# Patient Record
Sex: Female | Born: 1960 | Race: Black or African American | Hispanic: No | Marital: Married | Smoking: Former smoker
Health system: Southern US, Community
[De-identification: ages and names within clinical notes are randomized; demographics above are authoritative.]

## PROBLEM LIST (undated history)

## (undated) DIAGNOSIS — G40909 Epilepsy, unspecified, not intractable, without status epilepticus: Secondary | ICD-10-CM

## (undated) DIAGNOSIS — I10 Essential (primary) hypertension: Secondary | ICD-10-CM

## (undated) DIAGNOSIS — I509 Heart failure, unspecified: Secondary | ICD-10-CM

## (undated) DIAGNOSIS — F419 Anxiety disorder, unspecified: Secondary | ICD-10-CM

## (undated) DIAGNOSIS — J45909 Unspecified asthma, uncomplicated: Secondary | ICD-10-CM

## (undated) DIAGNOSIS — D869 Sarcoidosis, unspecified: Secondary | ICD-10-CM

## (undated) DIAGNOSIS — F319 Bipolar disorder, unspecified: Secondary | ICD-10-CM

## (undated) DIAGNOSIS — E538 Deficiency of other specified B group vitamins: Secondary | ICD-10-CM

## (undated) HISTORY — DX: Deficiency of other specified B group vitamins: E53.8

## (undated) HISTORY — DX: Anxiety disorder, unspecified: F41.9

## (undated) HISTORY — PX: TUBAL LIGATION: SHX77

## (undated) HISTORY — PX: TONSILLECTOMY: SUR1361

---

## 1999-12-11 HISTORY — PX: COLONOSCOPY: SHX174

## 2000-02-16 ENCOUNTER — Inpatient Hospital Stay (HOSPITAL_COMMUNITY): Admission: EM | Admit: 2000-02-16 | Discharge: 2000-02-23 | Payer: Self-pay | Admitting: Psychiatry

## 2000-03-06 ENCOUNTER — Encounter: Payer: Self-pay | Admitting: Internal Medicine

## 2000-03-06 ENCOUNTER — Encounter: Admission: RE | Admit: 2000-03-06 | Discharge: 2000-03-06 | Payer: Self-pay | Admitting: Internal Medicine

## 2000-04-05 ENCOUNTER — Other Ambulatory Visit: Admission: RE | Admit: 2000-04-05 | Discharge: 2000-04-05 | Payer: Self-pay | Admitting: Internal Medicine

## 2000-04-29 ENCOUNTER — Emergency Department (HOSPITAL_COMMUNITY): Admission: EM | Admit: 2000-04-29 | Discharge: 2000-04-29 | Payer: Self-pay | Admitting: Emergency Medicine

## 2000-05-25 ENCOUNTER — Emergency Department (HOSPITAL_COMMUNITY): Admission: EM | Admit: 2000-05-25 | Discharge: 2000-05-25 | Payer: Self-pay | Admitting: Emergency Medicine

## 2000-05-25 ENCOUNTER — Encounter: Payer: Self-pay | Admitting: Emergency Medicine

## 2000-06-22 ENCOUNTER — Emergency Department (HOSPITAL_COMMUNITY): Admission: EM | Admit: 2000-06-22 | Discharge: 2000-06-22 | Payer: Self-pay | Admitting: Emergency Medicine

## 2000-06-23 ENCOUNTER — Emergency Department (HOSPITAL_COMMUNITY): Admission: EM | Admit: 2000-06-23 | Discharge: 2000-06-23 | Payer: Self-pay | Admitting: Emergency Medicine

## 2000-07-29 ENCOUNTER — Ambulatory Visit (HOSPITAL_COMMUNITY): Admission: RE | Admit: 2000-07-29 | Discharge: 2000-07-29 | Payer: Self-pay | Admitting: Gastroenterology

## 2000-07-29 ENCOUNTER — Encounter: Payer: Self-pay | Admitting: Gastroenterology

## 2000-09-03 ENCOUNTER — Ambulatory Visit (HOSPITAL_COMMUNITY): Admission: RE | Admit: 2000-09-03 | Discharge: 2000-09-03 | Payer: Self-pay | Admitting: Gastroenterology

## 2000-09-03 ENCOUNTER — Encounter: Payer: Self-pay | Admitting: Gastroenterology

## 2001-03-28 ENCOUNTER — Encounter: Payer: Self-pay | Admitting: Emergency Medicine

## 2001-03-28 ENCOUNTER — Emergency Department (HOSPITAL_COMMUNITY): Admission: EM | Admit: 2001-03-28 | Discharge: 2001-03-28 | Payer: Self-pay | Admitting: Emergency Medicine

## 2001-04-29 ENCOUNTER — Inpatient Hospital Stay (HOSPITAL_COMMUNITY): Admission: EM | Admit: 2001-04-29 | Discharge: 2001-05-02 | Payer: Self-pay | Admitting: Psychiatry

## 2001-10-23 ENCOUNTER — Emergency Department (HOSPITAL_COMMUNITY): Admission: EM | Admit: 2001-10-23 | Discharge: 2001-10-23 | Payer: Self-pay | Admitting: Emergency Medicine

## 2002-01-11 ENCOUNTER — Emergency Department (HOSPITAL_COMMUNITY): Admission: EM | Admit: 2002-01-11 | Discharge: 2002-01-11 | Payer: Self-pay | Admitting: Emergency Medicine

## 2002-02-09 ENCOUNTER — Encounter
Admission: RE | Admit: 2002-02-09 | Discharge: 2002-03-12 | Payer: Self-pay | Admitting: Physical Medicine and Rehabilitation

## 2002-02-20 ENCOUNTER — Emergency Department (HOSPITAL_COMMUNITY): Admission: EM | Admit: 2002-02-20 | Discharge: 2002-02-21 | Payer: Self-pay | Admitting: Emergency Medicine

## 2002-02-26 ENCOUNTER — Encounter: Admission: RE | Admit: 2002-02-26 | Discharge: 2002-02-26 | Payer: Self-pay | Admitting: Urology

## 2002-02-26 ENCOUNTER — Encounter: Payer: Self-pay | Admitting: Urology

## 2002-08-24 ENCOUNTER — Encounter: Payer: Self-pay | Admitting: Emergency Medicine

## 2002-08-24 ENCOUNTER — Emergency Department (HOSPITAL_COMMUNITY): Admission: EM | Admit: 2002-08-24 | Discharge: 2002-08-24 | Payer: Self-pay | Admitting: Emergency Medicine

## 2002-10-18 ENCOUNTER — Emergency Department (HOSPITAL_COMMUNITY): Admission: EM | Admit: 2002-10-18 | Discharge: 2002-10-18 | Payer: Self-pay | Admitting: Emergency Medicine

## 2002-12-15 ENCOUNTER — Emergency Department (HOSPITAL_COMMUNITY): Admission: EM | Admit: 2002-12-15 | Discharge: 2002-12-15 | Payer: Self-pay | Admitting: Emergency Medicine

## 2003-02-20 ENCOUNTER — Encounter: Payer: Self-pay | Admitting: Emergency Medicine

## 2003-02-20 ENCOUNTER — Inpatient Hospital Stay (HOSPITAL_COMMUNITY): Admission: EM | Admit: 2003-02-20 | Discharge: 2003-02-22 | Payer: Self-pay | Admitting: Emergency Medicine

## 2003-02-22 ENCOUNTER — Encounter (INDEPENDENT_AMBULATORY_CARE_PROVIDER_SITE_OTHER): Payer: Self-pay | Admitting: Cardiovascular Disease

## 2003-06-08 ENCOUNTER — Emergency Department (HOSPITAL_COMMUNITY): Admission: EM | Admit: 2003-06-08 | Discharge: 2003-06-08 | Payer: Self-pay | Admitting: Emergency Medicine

## 2003-08-14 ENCOUNTER — Encounter: Payer: Self-pay | Admitting: Emergency Medicine

## 2003-08-14 ENCOUNTER — Emergency Department (HOSPITAL_COMMUNITY): Admission: EM | Admit: 2003-08-14 | Discharge: 2003-08-15 | Payer: Self-pay

## 2003-12-09 ENCOUNTER — Emergency Department (HOSPITAL_COMMUNITY): Admission: EM | Admit: 2003-12-09 | Discharge: 2003-12-10 | Payer: Self-pay | Admitting: Emergency Medicine

## 2004-05-22 ENCOUNTER — Ambulatory Visit (HOSPITAL_COMMUNITY): Admission: RE | Admit: 2004-05-22 | Discharge: 2004-05-22 | Payer: Self-pay | Admitting: Obstetrics and Gynecology

## 2005-01-06 ENCOUNTER — Inpatient Hospital Stay (HOSPITAL_COMMUNITY): Admission: AD | Admit: 2005-01-06 | Discharge: 2005-01-06 | Payer: Self-pay | Admitting: Obstetrics and Gynecology

## 2005-01-31 ENCOUNTER — Emergency Department (HOSPITAL_COMMUNITY): Admission: EM | Admit: 2005-01-31 | Discharge: 2005-01-31 | Payer: Self-pay | Admitting: Emergency Medicine

## 2005-06-01 ENCOUNTER — Emergency Department (HOSPITAL_COMMUNITY): Admission: EM | Admit: 2005-06-01 | Discharge: 2005-06-01 | Payer: Self-pay | Admitting: Family Medicine

## 2005-09-18 ENCOUNTER — Encounter: Admission: RE | Admit: 2005-09-18 | Discharge: 2005-09-18 | Payer: Self-pay | Admitting: Internal Medicine

## 2005-10-17 ENCOUNTER — Emergency Department (HOSPITAL_COMMUNITY): Admission: EM | Admit: 2005-10-17 | Discharge: 2005-10-18 | Payer: Self-pay | Admitting: Emergency Medicine

## 2006-02-24 ENCOUNTER — Emergency Department (HOSPITAL_COMMUNITY): Admission: EM | Admit: 2006-02-24 | Discharge: 2006-02-25 | Payer: Self-pay | Admitting: Emergency Medicine

## 2006-04-02 ENCOUNTER — Emergency Department (HOSPITAL_COMMUNITY): Admission: EM | Admit: 2006-04-02 | Discharge: 2006-04-02 | Payer: Self-pay | Admitting: Emergency Medicine

## 2006-09-01 ENCOUNTER — Emergency Department (HOSPITAL_COMMUNITY): Admission: EM | Admit: 2006-09-01 | Discharge: 2006-09-01 | Payer: Self-pay | Admitting: Family Medicine

## 2007-01-17 ENCOUNTER — Emergency Department (HOSPITAL_COMMUNITY): Admission: EM | Admit: 2007-01-17 | Discharge: 2007-01-17 | Payer: Self-pay | Admitting: Family Medicine

## 2007-03-02 ENCOUNTER — Emergency Department (HOSPITAL_COMMUNITY): Admission: EM | Admit: 2007-03-02 | Discharge: 2007-03-02 | Payer: Self-pay | Admitting: Emergency Medicine

## 2007-06-19 ENCOUNTER — Emergency Department (HOSPITAL_COMMUNITY): Admission: EM | Admit: 2007-06-19 | Discharge: 2007-06-19 | Payer: Self-pay | Admitting: Emergency Medicine

## 2007-07-04 ENCOUNTER — Emergency Department (HOSPITAL_COMMUNITY): Admission: EM | Admit: 2007-07-04 | Discharge: 2007-07-04 | Payer: Self-pay | Admitting: Family Medicine

## 2007-07-21 ENCOUNTER — Emergency Department (HOSPITAL_COMMUNITY): Admission: EM | Admit: 2007-07-21 | Discharge: 2007-07-21 | Payer: Self-pay | Admitting: Family Medicine

## 2007-08-24 ENCOUNTER — Emergency Department (HOSPITAL_COMMUNITY): Admission: EM | Admit: 2007-08-24 | Discharge: 2007-08-25 | Payer: Self-pay | Admitting: Emergency Medicine

## 2008-04-30 ENCOUNTER — Emergency Department (HOSPITAL_COMMUNITY): Admission: EM | Admit: 2008-04-30 | Discharge: 2008-04-30 | Payer: Self-pay | Admitting: Emergency Medicine

## 2008-05-16 ENCOUNTER — Emergency Department (HOSPITAL_COMMUNITY): Admission: EM | Admit: 2008-05-16 | Discharge: 2008-05-16 | Payer: Self-pay | Admitting: Family Medicine

## 2008-10-15 ENCOUNTER — Emergency Department (HOSPITAL_COMMUNITY): Admission: EM | Admit: 2008-10-15 | Discharge: 2008-10-15 | Payer: Self-pay | Admitting: Family Medicine

## 2009-04-02 ENCOUNTER — Emergency Department (HOSPITAL_COMMUNITY): Admission: EM | Admit: 2009-04-02 | Discharge: 2009-04-02 | Payer: Self-pay | Admitting: Family Medicine

## 2009-04-11 ENCOUNTER — Emergency Department (HOSPITAL_COMMUNITY): Admission: EM | Admit: 2009-04-11 | Discharge: 2009-04-11 | Payer: Self-pay | Admitting: Emergency Medicine

## 2010-07-13 ENCOUNTER — Emergency Department (HOSPITAL_COMMUNITY): Admission: EM | Admit: 2010-07-13 | Discharge: 2010-07-13 | Payer: Self-pay | Admitting: Family Medicine

## 2010-08-16 ENCOUNTER — Emergency Department (HOSPITAL_COMMUNITY): Admission: EM | Admit: 2010-08-16 | Discharge: 2010-08-16 | Payer: Self-pay | Admitting: Family Medicine

## 2010-11-12 ENCOUNTER — Emergency Department (HOSPITAL_COMMUNITY)
Admission: EM | Admit: 2010-11-12 | Discharge: 2010-11-12 | Payer: Self-pay | Source: Home / Self Care | Admitting: Emergency Medicine

## 2010-11-27 ENCOUNTER — Emergency Department (HOSPITAL_COMMUNITY)
Admission: EM | Admit: 2010-11-27 | Discharge: 2010-11-27 | Payer: Self-pay | Source: Home / Self Care | Admitting: Emergency Medicine

## 2010-12-30 ENCOUNTER — Encounter: Payer: Self-pay | Admitting: Internal Medicine

## 2010-12-31 ENCOUNTER — Encounter: Payer: Self-pay | Admitting: Internal Medicine

## 2011-01-05 ENCOUNTER — Emergency Department (HOSPITAL_COMMUNITY)
Admission: EM | Admit: 2011-01-05 | Discharge: 2011-01-05 | Payer: Self-pay | Source: Home / Self Care | Admitting: Emergency Medicine

## 2011-01-05 LAB — CBC
HCT: 40.5 % (ref 36.0–46.0)
MCH: 29.4 pg (ref 26.0–34.0)
MCHC: 32.8 g/dL (ref 30.0–36.0)
MCV: 89.4 fL (ref 78.0–100.0)
Platelets: 222 10*3/uL (ref 150–400)
RBC: 4.53 MIL/uL (ref 3.87–5.11)
RDW: 13.6 % (ref 11.5–15.5)
WBC: 6.9 10*3/uL (ref 4.0–10.5)

## 2011-01-05 LAB — BASIC METABOLIC PANEL
BUN: 9 mg/dL (ref 6–23)
Calcium: 9 mg/dL (ref 8.4–10.5)
Creatinine, Ser: 1 mg/dL (ref 0.4–1.2)
GFR calc Af Amer: >60 mL/min (ref >60)
GFR calc non Af Amer: 59 mL/min — ABNORMAL LOW (ref >60)
Glucose, Bld: 101 mg/dL — ABNORMAL HIGH (ref 70–99)
Sodium: 142 mEq/L (ref 135–145)

## 2011-02-19 LAB — URINALYSIS, ROUTINE W REFLEX MICROSCOPIC
Bilirubin Urine: NEGATIVE
Bilirubin Urine: NEGATIVE
Glucose, UA: NEGATIVE mg/dL
Glucose, UA: NEGATIVE mg/dL
Hgb urine dipstick: NEGATIVE
Hgb urine dipstick: NEGATIVE
Ketones, ur: NEGATIVE mg/dL
Ketones, ur: NEGATIVE mg/dL
Leukocytes, UA: NEGATIVE
Nitrite: NEGATIVE
Nitrite: NEGATIVE
Protein, ur: NEGATIVE mg/dL
Protein, ur: NEGATIVE mg/dL
Specific Gravity, Urine: 1.022 (ref 1.005–1.030)
Specific Gravity, Urine: 1.024 (ref 1.005–1.030)
Urobilinogen, UA: 0.2 mg/dL (ref 0.0–1.0)
Urobilinogen, UA: 0.2 mg/dL (ref 0.0–1.0)
pH: 5.5 (ref 5.0–8.0)
pH: 7.5 (ref 5.0–8.0)

## 2011-02-19 LAB — CBC
HCT: 39.8 % (ref 36.0–46.0)
Hemoglobin: 13.2 g/dL (ref 12.0–15.0)
MCH: 29.9 pg (ref 26.0–34.0)
MCH: 29.9 pg (ref 26.0–34.0)
MCHC: 33.2 g/dL (ref 30.0–36.0)
MCHC: 32.9 g/dL (ref 30.0–36.0)
MCV: 90 fL (ref 78.0–100.0)
Platelets: 231 10*3/uL (ref 150–400)
Platelets: 227 10*3/uL (ref 150–400)
RBC: 4.42 MIL/uL (ref 3.87–5.11)
RDW: 13.4 % (ref 11.5–15.5)
WBC: 9.2 10*3/uL (ref 4.0–10.5)

## 2011-02-19 LAB — DIFFERENTIAL
Basophils Absolute: 0 10*3/uL (ref 0.0–0.1)
Basophils Absolute: 0 10*3/uL (ref 0.0–0.1)
Basophils Relative: 0 % (ref 0–1)
Basophils Relative: 0 % (ref 0–1)
Eosinophils Absolute: 0 10*3/uL (ref 0.0–0.7)
Eosinophils Absolute: 0.1 10*3/uL (ref 0.0–0.7)
Eosinophils Relative: 0 % (ref 0–5)
Lymphocytes Relative: 23 % (ref 12–46)
Lymphs Abs: 2.1 10*3/uL (ref 0.7–4.0)
Monocytes Absolute: 0.5 10*3/uL (ref 0.1–1.0)
Monocytes Relative: 5 % (ref 3–12)
Neutro Abs: 6.5 10*3/uL (ref 1.7–7.7)
Neutrophils Relative %: 71 % (ref 43–77)
Neutrophils Relative %: 60 % (ref 43–77)

## 2011-02-19 LAB — BASIC METABOLIC PANEL
BUN: 6 mg/dL (ref 6–23)
CO2: 20 mEq/L (ref 19–32)
Calcium: 8.9 mg/dL (ref 8.4–10.5)
Chloride: 113 mEq/L — ABNORMAL HIGH (ref 96–112)
Creatinine, Ser: 0.96 mg/dL (ref 0.4–1.2)
GFR calc Af Amer: >60 mL/min (ref >60)
GFR calc non Af Amer: >60 mL/min (ref >60)
Glucose, Bld: 102 mg/dL — ABNORMAL HIGH (ref 70–99)
Potassium: 3.5 mEq/L (ref 3.5–5.1)
Sodium: 139 mEq/L (ref 135–145)

## 2011-02-19 LAB — URINE MICROSCOPIC-ADD ON

## 2011-02-19 LAB — HEPATIC FUNCTION PANEL
ALT: 12 U/L (ref 0–35)
AST: 13 U/L (ref 0–37)
Albumin: 3.2 g/dL — ABNORMAL LOW (ref 3.5–5.2)
Alkaline Phosphatase: 65 U/L (ref 39–117)
Bilirubin, Direct: <0.1 mg/dL (ref 0.0–0.3)
Total Bilirubin: 0.3 mg/dL (ref 0.3–1.2)
Total Protein: 6.9 g/dL (ref 6.0–8.3)

## 2011-02-19 LAB — POCT I-STAT, CHEM 8
Glucose, Bld: 89 mg/dL (ref 70–99)
HCT: 42 % (ref 36.0–46.0)
Hemoglobin: 14.3 g/dL (ref 12.0–15.0)
Potassium: 3.9 mEq/L (ref 3.5–5.1)
Sodium: 143 mEq/L (ref 135–145)

## 2011-02-19 LAB — URINE CULTURE
Colony Count: NO GROWTH
Culture  Setup Time: 201112041154
Culture: NO GROWTH

## 2011-02-19 LAB — POCT PREGNANCY, URINE
Preg Test, Ur: NEGATIVE
Preg Test, Ur: NEGATIVE

## 2011-02-22 LAB — POCT URINALYSIS DIPSTICK
Bilirubin Urine: NEGATIVE
Ketones, ur: NEGATIVE mg/dL
Nitrite: NEGATIVE
Protein, ur: NEGATIVE mg/dL

## 2011-02-23 LAB — POCT URINALYSIS DIPSTICK
Bilirubin Urine: NEGATIVE
Glucose, UA: NEGATIVE mg/dL
Hgb urine dipstick: NEGATIVE
Nitrite: NEGATIVE
Specific Gravity, Urine: 1.025 (ref 1.005–1.030)
Urobilinogen, UA: 0.2 mg/dL (ref 0.0–1.0)
pH: 5 (ref 5.0–8.0)

## 2011-03-20 LAB — POCT URINALYSIS DIP (DEVICE)
Bilirubin Urine: NEGATIVE
Glucose, UA: NEGATIVE mg/dL
Hgb urine dipstick: NEGATIVE
Specific Gravity, Urine: 1.025 (ref 1.005–1.030)
pH: 6.5 (ref 5.0–8.0)

## 2011-03-21 LAB — POCT URINALYSIS DIP (DEVICE)
Hgb urine dipstick: NEGATIVE
Ketones, ur: 40 mg/dL — AB
Protein, ur: 30 mg/dL — AB
pH: 6 (ref 5.0–8.0)

## 2011-04-27 NOTE — H&P (Signed)
[facility]  Patient:    Mikayla Sweeney, Mikayla Sweeney                      MRN: [id number] Adm. Date:  [id number] Attending:  Samule Dry                         History and Physical  DATE OF ADMISSION:  Apr 29, 2001  PATIENT IDENTIFICATION:  [name] is a 50 year old married African-American female admitted for depression, alcohol intoxication, and suicidal ideation with a plan to overdose on medication.  HISTORY OF PRESENT ILLNESS:  During the past month she has experienced some increased marital arguments.  There has been two weeks of euphoria with increased energy and decreased need for sleep followed by two recent weeks of suicidal ideation, depressed mood, increased sleep to 13 hours per day, depressed energy, and suicidal ideation with a plan to overdose on medication.  After being abstinent for eight years, the patient relapsed on alcohol the night prior to admission with six and a half beers.  PAST PSYCHIATRIC HISTORY:  The patient does have a long-term history of recurrent bouts of euphoria, decreased sleep, racing thoughts, pressured speech.  She has also undergone several severe depressive periods without any suicide attempts.  She has undergone five psychiatric admissions.  The last psychiatric admission was in March 2001 at [facility].  She does have a history of panic attacks which involve shortness of breath, feeling on edge, sweats, thoughts of doom and dread.  These have responded to SSRI therapy.  She has been tried on various psychotropics in the past with her seizure disorder as well as bipolar disorder stabilized on Topamax 200 mg p.o. b.i.d. along with Klonopin and Risperdal for antipychosis/antimania.  Of note, Risperdal has been particularly effective in eliminating suicidal thoughts.  When she has come off if it in the past, these suicidal thoughts have always returned.  However, the Risperdal  was not able to prevent the resumption of suicidal thoughts for this episode.  SUBSTANCE ABUSE HISTORY:  The patient has a history of drinking 12 to 24 beers daily for several months up to eight years ago.  She underwent residential alcohol rehabilitation at the [facility] in [place], [place].  The patient does have a history of periodic experimentation with crystal powder cocaine and marijuana when she was drinking, none in over eight years.  PAST MEDICAL HISTORY:  Gastroesophageal reflux disease, seizure disorder, hypertension, sarcoidosis.  Hemoglobin 15, hematocrit 43.  Basic chemistry panel: Within normal limits.  Tylenol and ASA levels: Unremarkable.  BAL was 106 several hours after consuming the alcohol.  Urine drug screen was unremarkable.  The patient has no known drug allergies.  Medications on admission: Klonopin 1 mg q.h.s., Celexa 80 mg p.o. q.d., Risperdal 3 mg p.o. q.h.s., Topamax 200 mg p.o. b.i.d., minocycline 100 mg p.o. q.d., Diltiazem CD 120 mg p.o. q.d., Prilosec 20 mg p.o. q.d., prednisone 5 mg p.o. q.d., metoclopramide 10 mg p.o. a.c. and h.s.  Review of systems: Cardiovascular: No chest pain or palpitations.  Concerning surgical history, she did have a T&A and tubal ligation in the past. Respiratory: No coughing or wheezing.  Although she does have sarcoidosis, there have been no manifestations recently.  Neurologic: No recent seizures. Endocrine/metabolic: No heat or cold intolerance.  Gastrointestinal: Some nausea the day prior to admission but none on May 21, no vomiting or diarrhea.  Genitourinary: No dysuria.  Musculoskeletal: Does continue with regular diffuse joint pain and does take prednisone regularly.  Skin: History of acne. Ob/Gyn: Has had regular Pap smears and has her next one due in three months. Head: No recent trauma, no visual or auditory difficulty.  Primary care physician is Dr. Willey Blade.  Physical examination: Neurologic: No  abnormal involuntary movements. Temperature 98.1, pulse 82, respirations 18, blood pressure 110/65 sitting, 126/78 standing.  Please see the emergency room report for the rest of the physical examination.  SOCIAL HISTORY:  The patient was born in [place], [place].  She had five brothers and two sisters.  No history of abuse.  She was educated through two years of college.  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  She has three children.  This is her second marriage.  She currently lives with her husband.  Family psychiatric history: Brother with bipolar disorder.  MENTAL STATUS EXAMINATION:  Alert, oriented to all spheres.  Speech: Within normal limits.  Thought process: Logical, coherent, and goal-directed, no looseness of associations.  Thought content: Does have suicidal ideation, does contract for no harm, no thoughts of harming others, no delusions, no hallucinations.  Memory: Within normal limits.  Concentration: Decreased. Affect is constricted.  Judgment is good for the need of treatment.  ADMISSION DIAGNOSES: Axis I:    1. Bipolar I disorder, depressed.            2. Panic disorder in current remission.            3. Alcohol dependence without physiologic dependence. Axis II:   Deferred. Axis III:  1. Recovering from alcohol intoxication.            2. Gastroesophageal reflux disease.            3. Hypertension.            4. Seizure disorder.            5. Sarcoidosis. Axis IV:   Marital problem. Axis V:    45.  INITIAL PLAN OF CARE: 1. Will consider Lamictal for mood stabilization versus increased Topamax. 2. The patient will recover from her alcohol intoxication and will continue to    be observed.  She will enter milieu and group psychotherapy in the dual    diagnosis tract. 3. No change in her other psychotropics planned since the patient has recently    experienced both hypomanic and depressive symptoms, her antidepressant     regimen will not be adjusted yet.  ESTIMATED LENGTH OF STAY:  Five days.  CONDITIONS NECESSARY FOR DISCHARGE:  Normal sleep, improved mood, absence of  destructive thought content.  POST HOSPITAL CARE PLAN:  Regular outpatient versus IOP. DD:  04/30/01 TD:  04/30/01 Job: [id number]

## 2011-04-27 NOTE — Discharge Summary (Signed)
[facility]  Patient:    Mikayla Sweeney, Mikayla Sweeney                      MRN: [id number] Adm. Date:  [id number] Disc. Date: [id number] Attending:  Devoria Albe                           Discharge Summary  HISTORY OF PRESENT ILLNESS:  Please see the admission history and physical dictation.  The patients blood pressure remained stable throughout the hospital course.  Thyroid panel was unremarkable.  CBC was unremarkable.  CMP was unremarkable. Urine drug screen was negative.  Urinalysis was unremarkable.  HOSPITAL COURSE:  Mikayla Sweeney was admitted to the inpatient psychiatric unit of [facility] and underwent milieu, group, and one-to-one psychotherapy.  She responded well to the safe holding environment and the therapy.  She tolerated her increase of Topamax to 200 mg q.a.m. and 250 mg q.h.s. well without adverse effects.  Her suicidal ideation resolved.  She showed no signs of alcohol withdrawal.  She maintained appropriate social behavior and her sleep resolved to within normal limits.  CONDITION ON DISCHARGE:  By May 24, Mikayla Sweeney continued to be socially appropriate with staff and other patients.  Mood, interest, energy, concentration, and appetite were all within normal limits.  She was not having any racing thoughts, hallucinations, or delusions.  She was not having any abnormal involuntary movements or other adverse medication effects.  Her sleep was within normal limits.  MENTAL STATUS EXAMINATION UPON DISCHARGE:  Alert, oriented to all spheres. Speech is within normal limits.  Thought process is logical, coherent, goal-directed, no looseness of associations.  Thought content: No thoughts of harming herself, no thoughts of harming others, no delusions, no hallucinations.  Memory: Within normal limits.  Concentration: Within normal limits.  Affect is broad and appropriate.  Judgment: Within normal limits.  DISCHARGE DIAGNOSES: Axis I:     Bipolar I disorder, depressed, in clinical remission. Axis II:   Deferred. Axis III:  1. Seizure disorder.            2. Gastroesophageal reflux disease.            3. Hypertension.            4. Sarcoidosis. Axis IV:   Marital. Axis V:    60.  DISCHARGE PLAN:  The patient agrees to call for thoughts of harming herself, thoughts of harming others, distress, hallucinations.  DISCHARGE MEDICATIONS: 1. Celexa 80 mg p.o. q.d. 2. Klonopin 1 mg p.o. q.h.s. 3. Diltiazem 120 mg p.o. q.d. 4. Minocin 100 mg p.o. q.d. 5. Protonix 40 mg p.o. q.d. 6. Prednisone 5 mg p.o. q.d. 7. Risperdal 3 mg p.o. q.h.s. 8. Topamax 200 mg p.o. q.a.m. and 250 mg p.o. q.h.s.  The patient will    increase the Topamax to 200 mg p.o. q.a.m. and 300 mg p.o. q.h.s. on May 27    for a more solid mood stabilizing dose.  ACTIVITY:  No driving.  DIET:  No restrictions.  FOLLOW-UP APPOINTMENTS: 1. Dr. Jennelle Human for psychiatric care at 1315 on May 31. 2. [facility] for her seizure management. DD:  05/02/01 TD:  05/02/01 Job: [id number]

## 2011-05-04 ENCOUNTER — Emergency Department (HOSPITAL_COMMUNITY)
Admission: EM | Admit: 2011-05-04 | Discharge: 2011-05-04 | Disposition: A | Discharge status: Home / Self Care | Payer: Medicare PPO | Attending: Emergency Medicine | Admitting: Emergency Medicine

## 2011-05-04 DIAGNOSIS — F411 Generalized anxiety disorder: Secondary | ICD-10-CM | POA: Insufficient documentation

## 2011-05-04 DIAGNOSIS — Z79899 Other long term (current) drug therapy: Secondary | ICD-10-CM | POA: Insufficient documentation

## 2011-05-04 DIAGNOSIS — F319 Bipolar disorder, unspecified: Secondary | ICD-10-CM | POA: Insufficient documentation

## 2011-05-04 DIAGNOSIS — G40909 Epilepsy, unspecified, not intractable, without status epilepticus: Secondary | ICD-10-CM | POA: Insufficient documentation

## 2011-09-06 LAB — INFLUENZA A AND B ANTIGEN (CONVERTED LAB): Influenza B Ag: NEGATIVE

## 2011-09-11 LAB — POCT URINALYSIS DIP (DEVICE)
Glucose, UA: NEGATIVE mg/dL
Ketones, ur: NEGATIVE mg/dL
Operator id: 239701
Specific Gravity, Urine: >=1.03 (ref 1.005–1.030)

## 2011-09-20 LAB — URINALYSIS, ROUTINE W REFLEX MICROSCOPIC
Bilirubin Urine: NEGATIVE
Nitrite: NEGATIVE
Specific Gravity, Urine: 1.011
Urobilinogen, UA: 0.2

## 2011-09-20 LAB — PREGNANCY, URINE: Preg Test, Ur: NEGATIVE

## 2011-09-24 LAB — POCT URINALYSIS DIP (DEVICE)
Glucose, UA: NEGATIVE
Hgb urine dipstick: NEGATIVE
Nitrite: NEGATIVE
Urobilinogen, UA: 0.2

## 2011-09-25 LAB — POCT URINALYSIS DIP (DEVICE)
Bilirubin Urine: NEGATIVE
Hgb urine dipstick: NEGATIVE
Ketones, ur: NEGATIVE
pH: 5.5

## 2011-09-25 LAB — I-STAT 8, (EC8 V) (CONVERTED LAB)
Bicarbonate: 19.8 — ABNORMAL LOW
Glucose, Bld: 91
Hemoglobin: 16.3 — ABNORMAL HIGH
Sodium: 141
TCO2: 21
pH, Ven: 7.326 — ABNORMAL HIGH

## 2011-12-20 ENCOUNTER — Other Ambulatory Visit: Payer: Self-pay | Admitting: Family Medicine

## 2011-12-20 ENCOUNTER — Other Ambulatory Visit (HOSPITAL_COMMUNITY)
Admission: RE | Admit: 2011-12-20 | Discharge: 2011-12-20 | Disposition: A | Discharge status: Home / Self Care | Payer: Medicare PPO | Source: Ambulatory Visit | Attending: Family Medicine | Admitting: Family Medicine

## 2011-12-20 DIAGNOSIS — Z1159 Encounter for screening for other viral diseases: Secondary | ICD-10-CM | POA: Insufficient documentation

## 2011-12-20 DIAGNOSIS — Z124 Encounter for screening for malignant neoplasm of cervix: Secondary | ICD-10-CM | POA: Insufficient documentation

## 2012-09-24 ENCOUNTER — Encounter (HOSPITAL_COMMUNITY): Payer: Self-pay | Admitting: Emergency Medicine

## 2012-09-24 DIAGNOSIS — F319 Bipolar disorder, unspecified: Secondary | ICD-10-CM | POA: Insufficient documentation

## 2012-09-24 DIAGNOSIS — R1110 Vomiting, unspecified: Secondary | ICD-10-CM | POA: Insufficient documentation

## 2012-09-24 DIAGNOSIS — R109 Unspecified abdominal pain: Secondary | ICD-10-CM | POA: Insufficient documentation

## 2012-09-24 DIAGNOSIS — G40909 Epilepsy, unspecified, not intractable, without status epilepticus: Secondary | ICD-10-CM | POA: Insufficient documentation

## 2012-09-24 DIAGNOSIS — K5900 Constipation, unspecified: Secondary | ICD-10-CM | POA: Insufficient documentation

## 2012-09-24 NOTE — ED Notes (Signed)
PT. REPORTS MID ABDOMINAL PAIN " TRAPPED GAS" ONSET THIS EVENING , VOMITTED X1 , DENIES DIARRHEA /FEVER OR VAGINAL DISCHARGE.

## 2012-09-25 ENCOUNTER — Emergency Department (HOSPITAL_COMMUNITY)
Admission: EM | Admit: 2012-09-25 | Discharge: 2012-09-25 | Disposition: A | Discharge status: Home / Self Care | Payer: 59 | Attending: Emergency Medicine | Admitting: Emergency Medicine

## 2012-09-25 ENCOUNTER — Emergency Department (HOSPITAL_COMMUNITY): Payer: 59

## 2012-09-25 ENCOUNTER — Encounter (HOSPITAL_COMMUNITY): Payer: Self-pay | Admitting: Radiology

## 2012-09-25 DIAGNOSIS — R109 Unspecified abdominal pain: Secondary | ICD-10-CM

## 2012-09-25 HISTORY — DX: Epilepsy, unspecified, not intractable, without status epilepticus: G40.909

## 2012-09-25 HISTORY — DX: Bipolar disorder, unspecified: F31.9

## 2012-09-25 LAB — URINALYSIS, ROUTINE W REFLEX MICROSCOPIC
Glucose, UA: NEGATIVE mg/dL
Leukocytes, UA: NEGATIVE
pH: 5.5 (ref 5.0–8.0)

## 2012-09-25 LAB — CBC WITH DIFFERENTIAL/PLATELET
Basophils Absolute: 0 10*3/uL (ref 0.0–0.1)
HCT: 41.2 % (ref 36.0–46.0)
Hemoglobin: 13.7 g/dL (ref 12.0–15.0)
Lymphocytes Relative: 15 % (ref 12–46)
Monocytes Absolute: 0.5 10*3/uL (ref 0.1–1.0)
Monocytes Relative: 5 % (ref 3–12)
Neutro Abs: 7.7 10*3/uL (ref 1.7–7.7)
WBC: 9.8 10*3/uL (ref 4.0–10.5)

## 2012-09-25 LAB — COMPREHENSIVE METABOLIC PANEL
AST: 13 U/L (ref 0–37)
Alkaline Phosphatase: 78 U/L (ref 39–117)
BUN: 10 mg/dL (ref 6–23)
CO2: 23 mEq/L (ref 19–32)
Chloride: 107 mEq/L (ref 96–112)
Creatinine, Ser: 0.96 mg/dL (ref 0.50–1.10)
GFR calc non Af Amer: 67 mL/min — ABNORMAL LOW (ref >90)
Total Bilirubin: 0.2 mg/dL — ABNORMAL LOW (ref 0.3–1.2)

## 2012-09-25 MED ORDER — ONDANSETRON HCL 4 MG/2ML IJ SOLN
4.0000 mg | Freq: Once | INTRAMUSCULAR | Status: AC
  Administered 2012-09-25: 4 mg via INTRAVENOUS
  Filled 2012-09-25: qty 2

## 2012-09-25 MED ORDER — SODIUM CHLORIDE 0.9 % IV BOLUS (SEPSIS)
500.0000 mL | Freq: Once | INTRAVENOUS | Status: AC
  Administered 2012-09-25: 500 mL via INTRAVENOUS

## 2012-09-25 MED ORDER — POLYETHYLENE GLYCOL 3350 17 GM/SCOOP PO POWD: 17.0000 g | Freq: Every day | ORAL | Status: DC

## 2012-09-25 MED ORDER — SODIUM CHLORIDE 0.9 % IV SOLN
INTRAVENOUS | Status: DC
  Administered 2012-09-25: 03:00:00 via INTRAVENOUS

## 2012-09-25 MED ORDER — HYDROMORPHONE HCL PF 1 MG/ML IJ SOLN
1.0000 mg | Freq: Once | INTRAMUSCULAR | Status: AC
  Administered 2012-09-25: 1 mg via INTRAVENOUS
  Filled 2012-09-25: qty 1

## 2012-09-25 MED ORDER — IOHEXOL 300 MG/ML  SOLN
20.0000 mL | INTRAMUSCULAR | Status: AC
  Administered 2012-09-25: 20 mL via ORAL

## 2012-09-25 MED ORDER — IOHEXOL 300 MG/ML  SOLN
100.0000 mL | Freq: Once | INTRAMUSCULAR | Status: AC | PRN
  Administered 2012-09-25: 100 mL via INTRAVENOUS

## 2012-09-25 MED ORDER — PROCHLORPERAZINE MALEATE 10 MG PO TABS: 10.0000 mg | ORAL_TABLET | Freq: Four times a day (QID) | ORAL | Status: DC | PRN

## 2012-09-25 MED ORDER — OXYCODONE-ACETAMINOPHEN 5-325 MG PO TABS: 1.0000 | ORAL_TABLET | ORAL | Status: DC | PRN

## 2012-09-25 NOTE — ED Notes (Signed)
Pt. Alert and oriented x4. Stable condition. Prescriptions x3 given with discharge instructions.

## 2012-09-25 NOTE — ED Provider Notes (Signed)
History     CSN: [id number]  Arrival date & time 09/24/12  2348   First MD Initiated Contact with Patient 09/25/12 0231      Chief Complaint  Patient presents with  . Abdominal Pain    (Consider location/radiation/quality/duration/timing/severity/associated sxs/prior treatment) HPI Comments: Mikayla Sweeney is a 51 y.o. Female who is here for abdominal pain. The abdominal pain started acutely last night. She vomited once. She denies any fever, chills,  diarrhea, dysuria, vaginal discharge, or rectal bleeding. She had a single episode of vomiting. She has occasional constipation. She attempted a bowel movement tonight but was able to only get a small amount of stool out. Her last bowel movement was soft. It was 2 days ago. There are no modifying factors.  Patient is a 51 y.o. female presenting with abdominal pain. The history is provided by the patient.  Abdominal Pain The primary symptoms of the illness include abdominal pain.    Past Medical History  Diagnosis Date  . Epilepsy   . Bipolar 1 disorder     Past Surgical History  Procedure Date  . Tonsillectomy   . Tubal ligation     No family history on file.  History  Substance Use Topics  . Smoking status: Never Smoker   . Smokeless tobacco: Not on file  . Alcohol Use: No    OB History    Grav Para Term Preterm Abortions TAB SAB Ect Mult Living                  Review of Systems  Gastrointestinal: Positive for abdominal pain.  All other systems reviewed and are negative.    Allergies  Review of patient's allergies indicates no known allergies.  Home Medications   Current Outpatient Rx  Name Route Sig Dispense Refill  . ALBUTEROL SULFATE HFA 108 (90 BASE) MCG/ACT IN AERS Inhalation Inhale 2 puffs into the lungs every 6 (six) hours as needed. For wheeze or shortness of breath    . VITAMIN D 1000 UNITS PO TABS Oral Take 2,000 Units by mouth daily.    Marland Kitchen CLONAZEPAM 1 MG PO TABS Oral Take 1 mg by mouth at  bedtime.     Marland Kitchen VITAMIN B-12 PO Oral Take 1 tablet by mouth daily.    Marland Kitchen MAGNESIUM PO Oral Take 1 tablet by mouth daily.    . ADULT MULTIVITAMIN W/MINERALS CH Oral Take 1 tablet by mouth daily.    Marland Kitchen ONDANSETRON PO Oral Take 1 tablet by mouth every 8 (eight) hours as needed. For nausea    . QUETIAPINE FUMARATE ER 400 MG PO TB24 Oral Take 400 mg by mouth at bedtime.    . TOPIRAMATE 200 MG PO TABS Oral Take 600 mg by mouth at bedtime.    . OXYCODONE-ACETAMINOPHEN 5-325 MG PO TABS Oral Take 1 tablet by mouth every 4 (four) hours as needed for pain. 20 tablet 0  . POLYETHYLENE GLYCOL 3350 PO POWD Oral Take 17 g by mouth daily. 527 g 0  . PROCHLORPERAZINE MALEATE 10 MG PO TABS Oral Take 1 tablet (10 mg total) by mouth every 6 (six) hours as needed (nausea/vomiting). 15 tablet 0    BP 144/78  Pulse 75  Temp 97.9 F (36.6 C) (Oral)  Resp 18  SpO2 99%  LMP 09/02/2012  Physical Exam  Nursing note and vitals reviewed. Constitutional: She is oriented to person, place, and time. She appears well-developed and well-nourished.  HENT:  Head: Normocephalic and atraumatic.  Eyes:  Conjunctivae normal and EOM are normal. Pupils are equal, round, and reactive to light.  Neck: Normal range of motion and phonation normal. Neck supple.  Cardiovascular: Normal rate, regular rhythm and intact distal pulses.   Pulmonary/Chest: Effort normal and breath sounds normal. She exhibits no tenderness.  Abdominal: Soft. She exhibits no distension and no mass. There is tenderness (Mild, diffuse). There is no rebound and no guarding.  Musculoskeletal: Normal range of motion.  Neurological: She is alert and oriented to person, place, and time. She has normal strength. She exhibits normal muscle tone.  Skin: Skin is warm and dry.  Psychiatric: She has a normal mood and affect. Her behavior is normal. Judgment and thought content normal.    ED Course  Procedures (including critical care time)   Emergency department  treatment: IV fluids. IV, Dilaudid, and Zofran  Reevaluation at discharge: Patient felt better. She requested treatment at home for constipation with MiraLAX. She wanted to use a prescription strength. I explained this is the same as the OTC strength, but she wanted a prescription.    Labs Reviewed  URINALYSIS, ROUTINE W REFLEX MICROSCOPIC - Abnormal; Notable for the following:    APPearance CLOUDY (*)     All other components within normal limits  CBC WITH DIFFERENTIAL - Abnormal; Notable for the following:    Neutrophils Relative 79 (*)     All other components within normal limits  COMPREHENSIVE METABOLIC PANEL - Abnormal; Notable for the following:    Total Bilirubin 0.2 (*)     GFR calc non Af Amer 67 (*)     GFR calc Af Amer 78 (*)     All other components within normal limits  POCT PREGNANCY, URINE   Ct Abdomen Pelvis W Contrast  09/25/2012  *RADIOLOGY REPORT*  Clinical Data: Abdominal pain and distension.  Vomiting.  CT ABDOMEN AND PELVIS WITH CONTRAST  Technique:  Multidetector CT imaging of the abdomen and pelvis was performed following the standard protocol during bolus administration of intravenous contrast.  Contrast: OMNIPAQUE IOHEXOL 300 MG/ML  SOLN  Comparison: None.  Findings: Dependent atelectasis in the lung bases.  The liver, spleen, gallbladder, pancreas, abdominal aorta, and retroperitoneal lymph nodes are unremarkable.  There is a 14 mm nodule in the right adrenal gland likely representing an adenoma. There is a 2 mm stone in the upper pole of the right kidney without evidence of obstruction.  No hydronephrosis in either kidney.  The stomach is filled with contrast.  No contrast material is demonstrated in the small bowel.  The gastric wall in the pyloric region appears to be somewhat thickened.  Changes may represent gastritis or peptic ulcer disease.  Small bowel and colon are not abnormally distended. Colon is stool-filled diffusely.  No free air or free fluid in  the abdomen.  Pelvis:  Uterus and ovaries are not enlarged.  Small amount of free fluid in the pelvis.  No loculated fluid collections.  Colonic diverticula without evidence of diverticulitis.  The appendix is not identified.  No significant lymphadenopathy in the pelvis.  Fat in the inguinal canals.  Mild degenerative changes in the lumbar spine.  IMPRESSION: Suggestion of wall thickening of the pyloric region of the stomach which could represent gastritis or ulcer disease.  The appearance is nonspecific.  Small amount of free fluid in the pelvis of nonspecific etiology.  Stool filled colon.  Nonobstructing stone in the upper pole of the right kidney.  Right adrenal gland nodule is likely an  adenoma.   Original Report Authenticated By: Marlon Pel, M.D.      1. Abdominal pain of unknown etiology       MDM  Nonspecific abdominal pain, with essentially negative evaluation in the emergency department. CT is possibly consistent with peptic ulcer disease. No other signs, and symptoms of serious intra-abdominal processes.      Plan: Home Medications- Percocet and Mirlax; Home Treatments- fluids and fiber; Recommended follow up- PCP of choice in 1-2 weeks   Flint Melter, MD 09/25/12 [phone]

## 2012-09-25 NOTE — ED Notes (Signed)
Pt. States "I have trapped gas". Reports increase abdominal pain since 1800 and abdominal distension. States vomited once and had one small BM with no relief. Pt. States hx of the same but this time symptoms are worse.

## 2012-09-29 ENCOUNTER — Telehealth: Payer: Self-pay | Admitting: *Deleted

## 2012-09-29 NOTE — Telephone Encounter (Signed)
Pt called and is scheduled for New Pt appt to see Dr Sanda Linger 10/30/12. Pt said that she went to ER last Wed night and had a CT Scan done. Pt has a collapsed lung and is having issues with wheezing an difficulty breathing. Pt req to get sooner work in appt with Dr Yetta Barre asap.

## 2012-10-18 ENCOUNTER — Emergency Department (HOSPITAL_COMMUNITY)
Admission: EM | Admit: 2012-10-18 | Discharge: 2012-10-18 | Disposition: A | Discharge status: Home / Self Care | Payer: 59 | Source: Home / Self Care | Attending: Family Medicine | Admitting: Family Medicine

## 2012-10-18 ENCOUNTER — Encounter (HOSPITAL_COMMUNITY): Payer: Self-pay | Admitting: Emergency Medicine

## 2012-10-18 DIAGNOSIS — H109 Unspecified conjunctivitis: Secondary | ICD-10-CM

## 2012-10-18 DIAGNOSIS — B9789 Other viral agents as the cause of diseases classified elsewhere: Secondary | ICD-10-CM

## 2012-10-18 DIAGNOSIS — B349 Viral infection, unspecified: Secondary | ICD-10-CM

## 2012-10-18 HISTORY — DX: Unspecified asthma, uncomplicated: J45.909

## 2012-10-18 LAB — POCT URINALYSIS DIP (DEVICE)
Glucose, UA: NEGATIVE mg/dL
Nitrite: NEGATIVE
Urobilinogen, UA: 0.2 mg/dL (ref 0.0–1.0)

## 2012-10-18 MED ORDER — ONDANSETRON HCL 4 MG PO TABS: 4.0000 mg | ORAL_TABLET | Freq: Three times a day (TID) | ORAL | Status: DC | PRN

## 2012-10-18 MED ORDER — TOBRAMYCIN 0.3 % OP SOLN: 1.0000 [drp] | Freq: Four times a day (QID) | OPHTHALMIC | Status: DC

## 2012-10-18 NOTE — ED Provider Notes (Signed)
History     CSN: [id number]  Arrival date & time 10/18/12  1619   First MD Initiated Contact with Patient 10/18/12 1622      Chief Complaint  Patient presents with  . Nausea    since thursday, hot/cold chills   . Eye Pain    drainage from right eye; eye was crusty and closed this a.m and having pain  . Urinary Tract Infection    urinary urgency and frequency. hx of recurrent uti's    (Consider location/radiation/quality/duration/timing/severity/associated sxs/prior treatment) Patient is a 51 y.o. female presenting with eye pain and urinary tract infection. The history is provided by the patient.  Eye Pain This is a new problem. The current episode started 2 days ago. The problem has not changed since onset.Pertinent negatives include no abdominal pain.  Urinary Tract Infection Pertinent negatives include no abdominal pain.    Past Medical History  Diagnosis Date  . Epilepsy   . Bipolar 1 disorder   . Asthma   . Bipolar 1 disorder   . Epilepsy     Past Surgical History  Procedure Date  . Tonsillectomy   . Tubal ligation     History reviewed. No pertinent family history.  History  Substance Use Topics  . Smoking status: Never Smoker   . Smokeless tobacco: Not on file  . Alcohol Use: No    OB History    Grav Para Term Preterm Abortions TAB SAB Ect Mult Living                  Review of Systems  Constitutional: Negative for fever and chills.  HENT: Negative.   Eyes: Positive for pain, discharge and redness.  Gastrointestinal: Positive for nausea. Negative for vomiting, abdominal pain, diarrhea and constipation.  Genitourinary: Positive for urgency and frequency. Negative for dysuria, vaginal bleeding and vaginal discharge.  Skin: Negative for rash.    Allergies  Review of patient's allergies indicates no known allergies.  Home Medications   Current Outpatient Rx  Name  Route  Sig  Dispense  Refill  . QUETIAPINE FUMARATE ER 400 MG PO TB24   Oral  Take 400 mg by mouth at bedtime.         . TOPIRAMATE 200 MG PO TABS   Oral   Take 600 mg by mouth at bedtime.         . ALBUTEROL SULFATE HFA 108 (90 BASE) MCG/ACT IN AERS   Inhalation   Inhale 2 puffs into the lungs every 6 (six) hours as needed. For wheeze or shortness of breath         . VITAMIN D 1000 UNITS PO TABS   Oral   Take 2,000 Units by mouth daily.         Marland Kitchen CLONAZEPAM 1 MG PO TABS   Oral   Take 1 mg by mouth at bedtime.          Marland Kitchen VITAMIN B-12 PO   Oral   Take 1 tablet by mouth daily.         Marland Kitchen MAGNESIUM PO   Oral   Take 1 tablet by mouth daily.         . ADULT MULTIVITAMIN W/MINERALS CH   Oral   Take 1 tablet by mouth daily.         Marland Kitchen ONDANSETRON HCL 4 MG PO TABS   Oral   Take 1 tablet (4 mg total) by mouth every 8 (eight) hours as needed for nausea.  6 tablet   0   . ONDANSETRON PO   Oral   Take 1 tablet by mouth every 8 (eight) hours as needed. For nausea         . OXYCODONE-ACETAMINOPHEN 5-325 MG PO TABS   Oral   Take 1 tablet by mouth every 4 (four) hours as needed for pain.   20 tablet   0   . POLYETHYLENE GLYCOL 3350 PO POWD   Oral   Take 17 g by mouth daily.   527 g   0   . PROCHLORPERAZINE MALEATE 10 MG PO TABS   Oral   Take 1 tablet (10 mg total) by mouth every 6 (six) hours as needed (nausea/vomiting).   15 tablet   0   . TOBRAMYCIN SULFATE 0.3 % OP SOLN   Both Eyes   Place 1 drop into both eyes every 6 (six) hours.   5 mL   0     BP 163/94  Pulse 83  Temp 98.2 F (36.8 C) (Oral)  Resp 18  SpO2 100%  LMP 10/02/2012  Physical Exam  Nursing note and vitals reviewed. Constitutional: She is oriented to person, place, and time. She appears well-developed and well-nourished. No distress.  HENT:  Head: Normocephalic.  Right Ear: External ear normal.  Left Ear: External ear normal.  Nose: Nose normal.  Mouth/Throat: Oropharynx is clear and moist.  Eyes: EOM and lids are normal. Pupils are equal,  round, and reactive to light. Right conjunctiva is injected. Left conjunctiva is injected.  Neck: Normal range of motion. Neck supple.  Cardiovascular: Regular rhythm and normal heart sounds.   Pulmonary/Chest: Effort normal and breath sounds normal.  Abdominal: Soft. Bowel sounds are normal. She exhibits no mass. There is tenderness in the suprapubic area. There is no rebound, no guarding and no CVA tenderness.  Lymphadenopathy:    She has no cervical adenopathy.  Neurological: She is alert and oriented to person, place, and time.  Skin: Skin is warm and dry.  Psychiatric: She has a normal mood and affect.    ED Course  Procedures (including critical care time)   Labs Reviewed  POCT URINALYSIS DIP (DEVICE)   No results found.   1. Conjunctivitis of both eyes   2. Acute viral syndrome       MDM  U/a neg        Linna Hoff, MD 10/18/12 1728

## 2012-10-18 NOTE — ED Notes (Signed)
Pt c/o urinary frequency and urgency over the past several days. Pt has hx of recurrent uti's  Pt states that she is having hot and cold chills and nausea.   Having pain and drainage of right eye. Woke this a.m and eye was crusty symptoms present since Thursday.  Pt denies fever, diarrhea and vomiting.

## 2012-10-30 ENCOUNTER — Ambulatory Visit: Payer: Medicare PPO | Admitting: Internal Medicine

## 2012-10-30 DIAGNOSIS — Z0289 Encounter for other administrative examinations: Secondary | ICD-10-CM

## 2012-12-18 ENCOUNTER — Emergency Department (HOSPITAL_COMMUNITY)
Admission: EM | Admit: 2012-12-18 | Discharge: 2012-12-18 | Disposition: A | Discharge status: Home / Self Care | Payer: 59 | Source: Home / Self Care | Attending: Emergency Medicine | Admitting: Emergency Medicine

## 2012-12-18 ENCOUNTER — Encounter (HOSPITAL_COMMUNITY): Payer: Self-pay | Admitting: *Deleted

## 2012-12-18 DIAGNOSIS — J069 Acute upper respiratory infection, unspecified: Secondary | ICD-10-CM

## 2012-12-18 MED ORDER — LORATADINE 10 MG PO TABS: 10.0000 mg | ORAL_TABLET | Freq: Every day | ORAL | Status: DC

## 2012-12-18 NOTE — ED Notes (Signed)
C/o headache, cough, sneezing, body aches, chills but no fever 12/25.

## 2012-12-18 NOTE — ED Provider Notes (Signed)
History     CSN: [id number]  Arrival date & time 12/18/12  3664   First MD Initiated Contact with Patient 12/18/12 2102      Chief Complaint  Patient presents with  . Influenza    (Consider location/radiation/quality/duration/timing/severity/associated sxs/prior treatment) Patient is a 52 y.o. female presenting with cough. The history is provided by the patient.  Cough This is a new problem. The current episode started more than 1 week ago. The problem occurs every few minutes. The problem has been gradually worsening. The cough is non-productive. There has been no fever. Associated symptoms include chills, sweats, headaches, sore throat, myalgias and wheezing. Treatments tried: theraflu, alkaseltzer, nyquil. The treatment provided mild relief. She is not a smoker. Her past medical history is significant for asthma.    Past Medical History  Diagnosis Date  . Epilepsy   . Bipolar 1 disorder   . Asthma   . Bipolar 1 disorder   . Epilepsy     Past Surgical History  Procedure Date  . Tonsillectomy   . Tubal ligation     Family History  Problem Relation Age of Onset  . Pancreatic cancer Mother   . Hypertension Mother   . Diabetes Mother   . Heart disease Father   . Gout Father   . Cirrhosis Father     History  Substance Use Topics  . Smoking status: Never Smoker   . Smokeless tobacco: Not on file  . Alcohol Use: No    OB History    Grav Para Term Preterm Abortions TAB SAB Ect Mult Living                  Review of Systems  Constitutional: Positive for chills.  HENT: Positive for sore throat.   Respiratory: Positive for cough and wheezing.   Musculoskeletal: Positive for myalgias.  Neurological: Positive for headaches.  All other systems reviewed and are negative.    Allergies  Review of patient's allergies indicates no known allergies.  Home Medications   Current Outpatient Rx  Name  Route  Sig  Dispense  Refill  . ALBUTEROL SULFATE HFA 108 (90  BASE) MCG/ACT IN AERS   Inhalation   Inhale 2 puffs into the lungs every 6 (six) hours as needed. For wheeze or shortness of breath         . CLONAZEPAM 1 MG PO TABS   Oral   Take 1 mg by mouth at bedtime.          Marland Kitchen MAGNESIUM PO   Oral   Take 1 tablet by mouth daily.         . ADULT MULTIVITAMIN W/MINERALS CH   Oral   Take 1 tablet by mouth daily.         Marland Kitchen ONDANSETRON HCL 4 MG PO TABS   Oral   Take 1 tablet (4 mg total) by mouth every 8 (eight) hours as needed for nausea.   6 tablet   0   . QUETIAPINE FUMARATE ER 400 MG PO TB24   Oral   Take 400 mg by mouth at bedtime.         . TOPIRAMATE 200 MG PO TABS   Oral   Take 600 mg by mouth at bedtime.         Marland Kitchen VITAMIN D 1000 UNITS PO TABS   Oral   Take 2,000 Units by mouth daily.         Marland Kitchen VITAMIN B-12 PO  Oral   Take 1 tablet by mouth daily.         Marland Kitchen LORATADINE 10 MG PO TABS   Oral   Take 1 tablet (10 mg total) by mouth daily.   30 tablet   0   . ONDANSETRON PO   Oral   Take 1 tablet by mouth every 8 (eight) hours as needed. For nausea         . OXYCODONE-ACETAMINOPHEN 5-325 MG PO TABS   Oral   Take 1 tablet by mouth every 4 (four) hours as needed for pain.   20 tablet   0   . POLYETHYLENE GLYCOL 3350 PO POWD   Oral   Take 17 g by mouth daily.   527 g   0   . PROCHLORPERAZINE MALEATE 10 MG PO TABS   Oral   Take 1 tablet (10 mg total) by mouth every 6 (six) hours as needed (nausea/vomiting).   15 tablet   0   . TOBRAMYCIN SULFATE 0.3 % OP SOLN   Both Eyes   Place 1 drop into both eyes every 6 (six) hours.   5 mL   0     BP 135/69  Pulse 62  Temp 98 F (36.7 C) (Oral)  Resp 16  SpO2 100%  LMP 12/06/2012  Physical Exam  Nursing note and vitals reviewed. Constitutional: She is oriented to person, place, and time. Vital signs are normal. She appears well-developed and well-nourished. She is active and cooperative.  HENT:  Head: Normocephalic.  Right Ear: External ear  normal. A middle ear effusion is present.  Left Ear: Tympanic membrane and external ear normal.  Nose: Nose normal. Right sinus exhibits no maxillary sinus tenderness and no frontal sinus tenderness. Left sinus exhibits no maxillary sinus tenderness and no frontal sinus tenderness.  Mouth/Throat: Uvula is midline, oropharynx is clear and moist and mucous membranes are normal. No oropharyngeal exudate.       Postnasal drip noted  Eyes: Conjunctivae normal are normal. Pupils are equal, round, and reactive to light. No scleral icterus.  Neck: Trachea normal and normal range of motion. Neck supple.  Cardiovascular: Normal rate, regular rhythm, normal heart sounds, intact distal pulses and normal pulses.   Pulmonary/Chest: Effort normal and breath sounds normal.  Lymphadenopathy:       Head (right side): No submental, no submandibular, no tonsillar, no preauricular, no posterior auricular and no occipital adenopathy present.       Head (left side): No submental, no submandibular, no tonsillar, no preauricular, no posterior auricular and no occipital adenopathy present.    She has no cervical adenopathy.  Neurological: She is alert and oriented to person, place, and time. No cranial nerve deficit or sensory deficit.  Skin: Skin is warm and dry.  Psychiatric: She has a normal mood and affect. Her speech is normal and behavior is normal. Judgment and thought content normal. Cognition and memory are normal.    ED Course  Procedures (including critical care time)  Labs Reviewed - No data to display No results found.   1. URI (upper respiratory infection)       MDM  Increase fluids, nasal saline as needed for congestion, antihistamine of your choice.  Follow up with pcp if symptoms are not improved.          Johnsie Kindred, NP 12/18/12 2207

## 2012-12-19 NOTE — ED Provider Notes (Signed)
Medical screening examination/treatment/procedure(s) were performed by non-physician practitioner and as supervising physician I was immediately available for consultation/collaboration.  Raynald Blend, MD 12/19/12 1002

## 2012-12-27 ENCOUNTER — Encounter (HOSPITAL_COMMUNITY): Payer: Self-pay | Admitting: *Deleted

## 2012-12-27 ENCOUNTER — Emergency Department (HOSPITAL_COMMUNITY): Payer: 59

## 2012-12-27 ENCOUNTER — Emergency Department (HOSPITAL_COMMUNITY)
Admission: EM | Admit: 2012-12-27 | Discharge: 2012-12-27 | Disposition: A | Discharge status: Home / Self Care | Payer: 59 | Attending: Emergency Medicine | Admitting: Emergency Medicine

## 2012-12-27 DIAGNOSIS — G40909 Epilepsy, unspecified, not intractable, without status epilepticus: Secondary | ICD-10-CM | POA: Insufficient documentation

## 2012-12-27 DIAGNOSIS — R531 Weakness: Secondary | ICD-10-CM

## 2012-12-27 DIAGNOSIS — R5381 Other malaise: Secondary | ICD-10-CM | POA: Insufficient documentation

## 2012-12-27 DIAGNOSIS — R110 Nausea: Secondary | ICD-10-CM | POA: Insufficient documentation

## 2012-12-27 DIAGNOSIS — J45909 Unspecified asthma, uncomplicated: Secondary | ICD-10-CM | POA: Insufficient documentation

## 2012-12-27 DIAGNOSIS — Z79899 Other long term (current) drug therapy: Secondary | ICD-10-CM | POA: Insufficient documentation

## 2012-12-27 DIAGNOSIS — F319 Bipolar disorder, unspecified: Secondary | ICD-10-CM | POA: Insufficient documentation

## 2012-12-27 DIAGNOSIS — J3489 Other specified disorders of nose and nasal sinuses: Secondary | ICD-10-CM | POA: Insufficient documentation

## 2012-12-27 DIAGNOSIS — R21 Rash and other nonspecific skin eruption: Secondary | ICD-10-CM | POA: Insufficient documentation

## 2012-12-27 LAB — COMPREHENSIVE METABOLIC PANEL
ALT: 17 U/L (ref 0–35)
AST: 29 U/L (ref 0–37)
Alkaline Phosphatase: 75 U/L (ref 39–117)
CO2: 23 mEq/L (ref 19–32)
GFR calc Af Amer: 81 mL/min — ABNORMAL LOW (ref >90)
Glucose, Bld: 82 mg/dL (ref 70–99)
Potassium: 4.9 mEq/L (ref 3.5–5.1)
Sodium: 138 mEq/L (ref 135–145)
Total Protein: 8 g/dL (ref 6.0–8.3)

## 2012-12-27 LAB — CBC WITH DIFFERENTIAL/PLATELET
Eosinophils Absolute: 0.1 10*3/uL (ref 0.0–0.7)
Lymphocytes Relative: 28 % (ref 12–46)
Lymphs Abs: 2.5 10*3/uL (ref 0.7–4.0)
Neutrophils Relative %: 64 % (ref 43–77)
Platelets: 219 10*3/uL (ref 150–400)
RBC: 4.81 MIL/uL (ref 3.87–5.11)
WBC: 8.8 10*3/uL (ref 4.0–10.5)

## 2012-12-27 LAB — URINALYSIS, ROUTINE W REFLEX MICROSCOPIC
Bilirubin Urine: NEGATIVE
Glucose, UA: NEGATIVE mg/dL
Hgb urine dipstick: NEGATIVE
Ketones, ur: NEGATIVE mg/dL
Nitrite: NEGATIVE
pH: 6 (ref 5.0–8.0)

## 2012-12-27 MED ORDER — ACETAMINOPHEN 325 MG PO TABS
650.0000 mg | ORAL_TABLET | Freq: Once | ORAL | Status: AC
  Administered 2012-12-27: 650 mg via ORAL
  Filled 2012-12-27: qty 2

## 2012-12-27 MED ORDER — SODIUM CHLORIDE 0.9 % IV BOLUS (SEPSIS)
1000.0000 mL | Freq: Once | INTRAVENOUS | Status: AC
  Administered 2012-12-27: 1000 mL via INTRAVENOUS

## 2012-12-27 MED ORDER — SODIUM CHLORIDE 0.9 % IV SOLN
INTRAVENOUS | Status: DC
  Administered 2012-12-27: 16:00:00 via INTRAVENOUS

## 2012-12-27 NOTE — ED Notes (Signed)
Pt here with multiple complaints, all since December.  Nasal congestion that is not improved with benadryl and claratin. Nausea that is not improved with zofran.  Headaches and hot flashes.  Rash in between her legs that does not itch or burn (denies vag discharge).  Headaches.  Pt does have a pcp, but states her husband brought her here.

## 2012-12-27 NOTE — ED Provider Notes (Signed)
History     CSN: [id number]  Arrival date & time 12/27/12  1327   First MD Initiated Contact with Patient 12/27/12 1423      Chief Complaint  Patient presents with  . Rash  . Nasal Congestion  . Nausea    (Consider location/radiation/quality/duration/timing/severity/associated sxs/prior treatment) Patient is a 52 y.o. female presenting with rash. The history is provided by the patient.  Rash    patient here with rash and nasal congestion x3 weeks. Seen at urgent care Center and diagnosed with URI and told to take over-the-counter medications. Symptoms have not improved. She notes diffuse weakness throughout her body. Denies any vomiting or diarrhea. No chest pain chest pressure. No severe headache or photophobia or neck pain. Denies any urinary symptoms at this time. Nothing makes her symptoms better worse and have been persistent  Past Medical History  Diagnosis Date  . Epilepsy   . Bipolar 1 disorder   . Asthma   . Bipolar 1 disorder   . Epilepsy     Past Surgical History  Procedure Date  . Tonsillectomy   . Tubal ligation     Family History  Problem Relation Age of Onset  . Pancreatic cancer Mother   . Hypertension Mother   . Diabetes Mother   . Heart disease Father   . Gout Father   . Cirrhosis Father     History  Substance Use Topics  . Smoking status: Never Smoker   . Smokeless tobacco: Not on file  . Alcohol Use: No    OB History    Grav Para Term Preterm Abortions TAB SAB Ect Mult Living                  Review of Systems  Skin: Positive for rash.  All other systems reviewed and are negative.    Allergies  Review of patient's allergies indicates no known allergies.  Home Medications   Current Outpatient Rx  Name  Route  Sig  Dispense  Refill  . ALBUTEROL SULFATE HFA 108 (90 BASE) MCG/ACT IN AERS   Inhalation   Inhale 2 puffs into the lungs every 6 (six) hours as needed. For wheeze or shortness of breath         . VITAMIN D 1000  UNITS PO TABS   Oral   Take 2,000 Units by mouth daily.         Marland Kitchen CLONAZEPAM 1 MG PO TABS   Oral   Take 1 mg by mouth at bedtime.          Marland Kitchen VITAMIN B-12 PO   Oral   Take 1 tablet by mouth daily.         Marland Kitchen LORATADINE 10 MG PO TABS   Oral   Take 1 tablet (10 mg total) by mouth daily.   30 tablet   0   . MAGNESIUM PO   Oral   Take 1 tablet by mouth daily.         . ADULT MULTIVITAMIN W/MINERALS CH   Oral   Take 1 tablet by mouth daily.         Marland Kitchen ONDANSETRON HCL 4 MG PO TABS   Oral   Take 1 tablet (4 mg total) by mouth every 8 (eight) hours as needed for nausea.   6 tablet   0   . ONDANSETRON PO   Oral   Take 1 tablet by mouth every 8 (eight) hours as needed. For nausea         .  OXYCODONE-ACETAMINOPHEN 5-325 MG PO TABS   Oral   Take 1 tablet by mouth every 4 (four) hours as needed for pain.   20 tablet   0   . POLYETHYLENE GLYCOL 3350 PO POWD   Oral   Take 17 g by mouth daily.   527 g   0   . PROCHLORPERAZINE MALEATE 10 MG PO TABS   Oral   Take 1 tablet (10 mg total) by mouth every 6 (six) hours as needed (nausea/vomiting).   15 tablet   0   . QUETIAPINE FUMARATE ER 400 MG PO TB24   Oral   Take 400 mg by mouth at bedtime.         . TOBRAMYCIN SULFATE 0.3 % OP SOLN   Both Eyes   Place 1 drop into both eyes every 6 (six) hours.   5 mL   0   . TOPIRAMATE 200 MG PO TABS   Oral   Take 600 mg by mouth at bedtime.           BP 154/109  Pulse 95  Temp 98.3 F (36.8 C) (Oral)  Resp 18  SpO2 100%  LMP 12/06/2012  Physical Exam  Nursing note and vitals reviewed. Constitutional: She is oriented to person, place, and time. She appears well-developed and well-nourished.  Non-toxic appearance. No distress.  HENT:  Head: Normocephalic and atraumatic.  Eyes: Conjunctivae normal, EOM and lids are normal. Pupils are equal, round, and reactive to light.  Neck: Normal range of motion. Neck supple. No tracheal deviation present. No mass  present.  Cardiovascular: Normal rate, regular rhythm and normal heart sounds.  Exam reveals no gallop.   No murmur heard. Pulmonary/Chest: Effort normal and breath sounds normal. No stridor. No respiratory distress. She has no decreased breath sounds. She has no wheezes. She has no rhonchi. She has no rales.  Abdominal: Soft. Normal appearance and bowel sounds are normal. She exhibits no distension. There is no tenderness. There is no rebound and no CVA tenderness.  Musculoskeletal: Normal range of motion. She exhibits no edema and no tenderness.  Neurological: She is alert and oriented to person, place, and time. She has normal strength. No cranial nerve deficit or sensory deficit. GCS eye subscore is 4. GCS verbal subscore is 5. GCS motor subscore is 6.  Skin: Skin is warm and dry. Rash noted. No abrasion noted. Rash is papular.  Psychiatric: She has a normal mood and affect. Her speech is normal and behavior is normal.    ED Course  Procedures (including critical care time)  Labs Reviewed - No data to display No results found.   No diagnosis found.    MDM  Labs and x-rays reviewed here and without acute findings. Rash is papular and not petechial. No concerning signs of same. Patient be discharged to followup with her Dr. She was given IV fluids and she does flow better        Toy Baker, MD 12/27/12 [phone]

## 2012-12-30 LAB — URINE CULTURE: Colony Count: >=100000

## 2012-12-31 NOTE — ED Notes (Signed)
+   Urine Chart sent to [facility] for review.

## 2013-01-02 NOTE — ED Notes (Signed)
Chart returned from EDP office  . Recommend Kelfex 500 mg po BID x 7 days written by Fayrene Helper  needs to be called to pharmacy.

## 2013-01-03 NOTE — ED Notes (Signed)
Pt notified of positive urine culture result. RX Keflex called to [facility] [phone].

## 2013-01-24 ENCOUNTER — Other Ambulatory Visit: Payer: Self-pay

## 2013-10-15 ENCOUNTER — Other Ambulatory Visit: Payer: Self-pay

## 2014-03-06 IMAGING — CT CT ABD-PELV W/ CM
1 of 3 series · 14 of 32 positions shown, 19 images · IV contrast (APPLIED)
Comparison: None.

CLINICAL DATA: Abdominal pain and distension.  Vomiting.

CT ABDOMEN AND PELVIS WITH CONTRAST
TECHNIQUE: Multidetector CT imaging of the abdomen and pelvis was
performed following the standard protocol during bolus
administration of intravenous contrast.
Contrast: 100mL OMNIPAQUE IOHEXOL 300 MG/ML  SOLN

[Series 2: abd/pelv with 5.0 b31f st · axial · 0.70mm/px · z∈[-467,-32]mm · 14 of 99 slices shown, 19 images]
[im 6/99  soft-tissue]
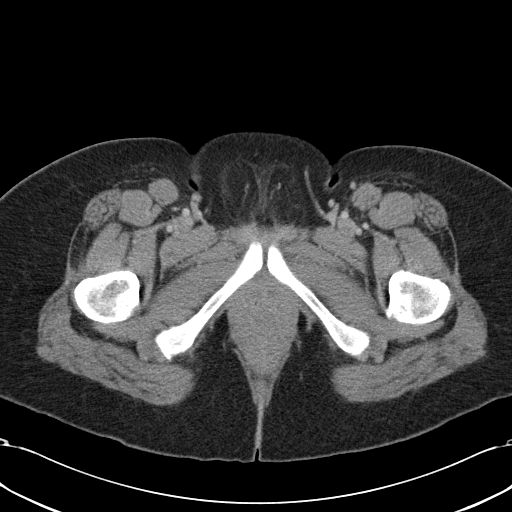
[im 6/99  bone]
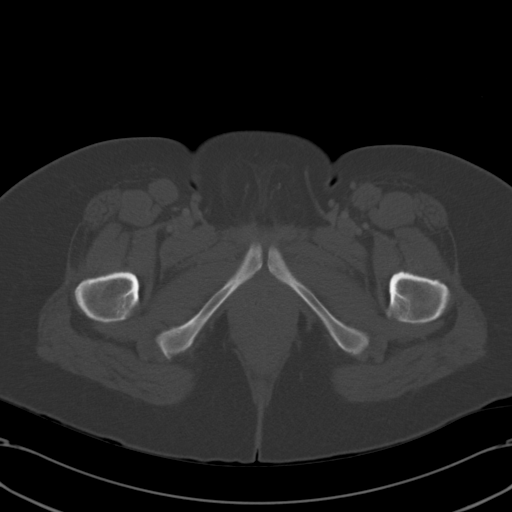
[im 16/99  soft-tissue]
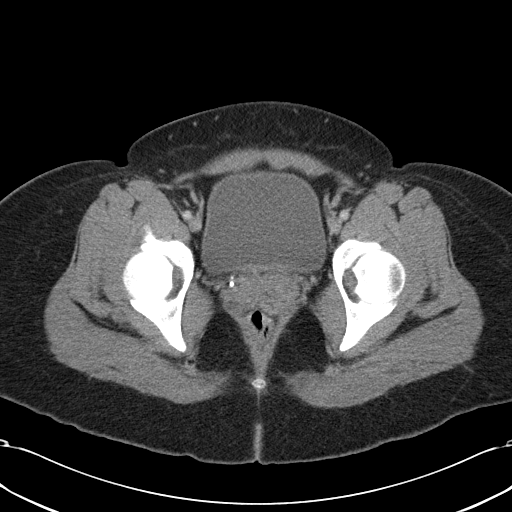
[im 21/99  soft-tissue]
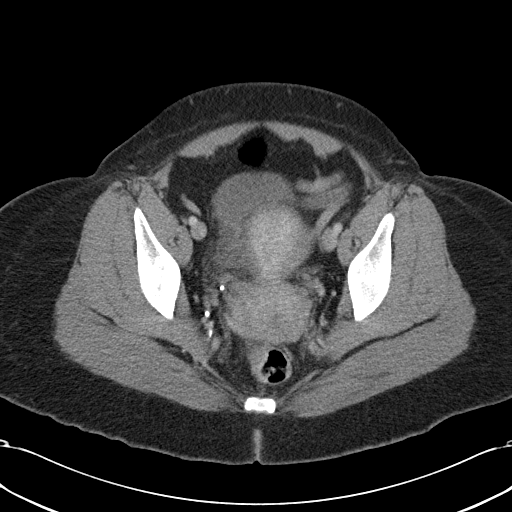
[im 26/99  soft-tissue]
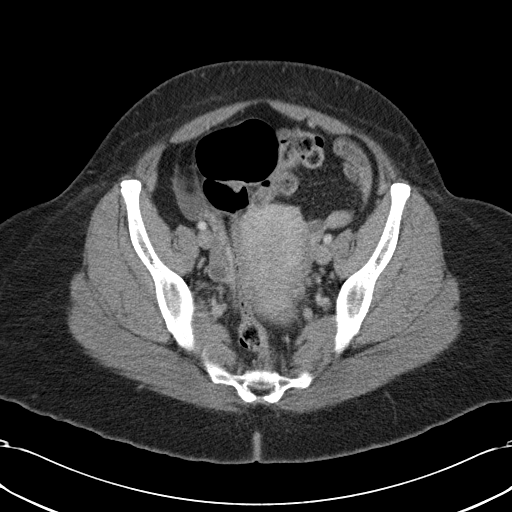
[im 37/99  soft-tissue]
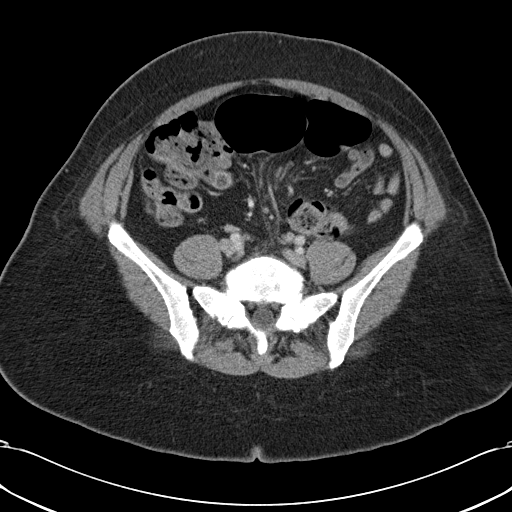
[im 42/99  soft-tissue]
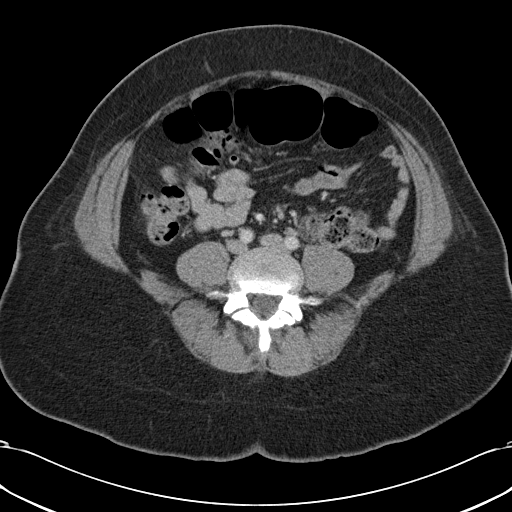
[im 52/99  soft-tissue]
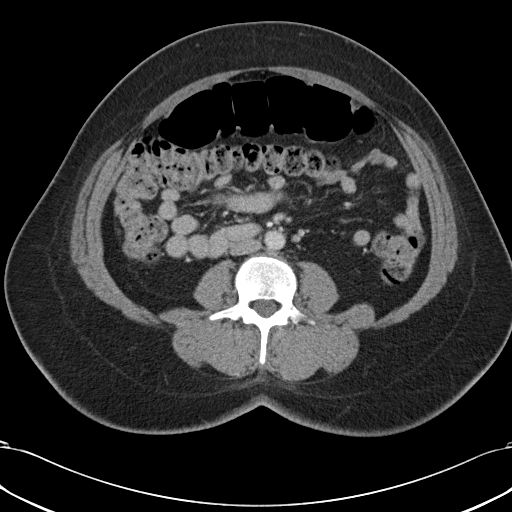
[im 57/99  soft-tissue]
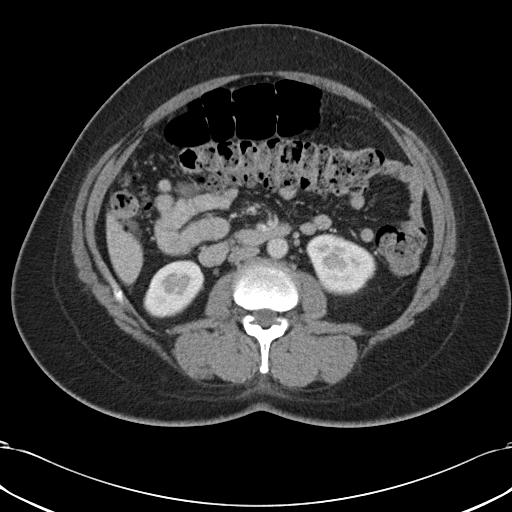
[im 62/99  soft-tissue]
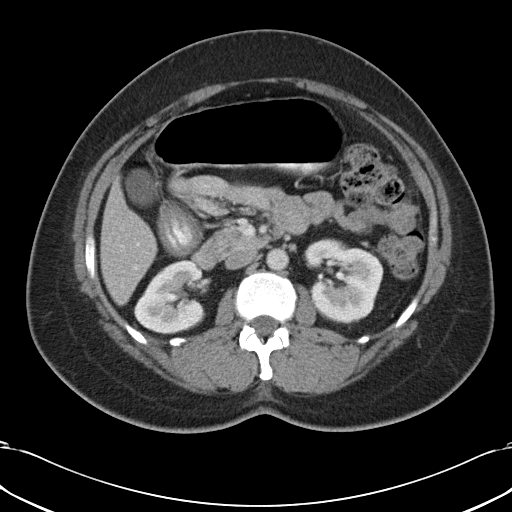
[im 62/99  bone]
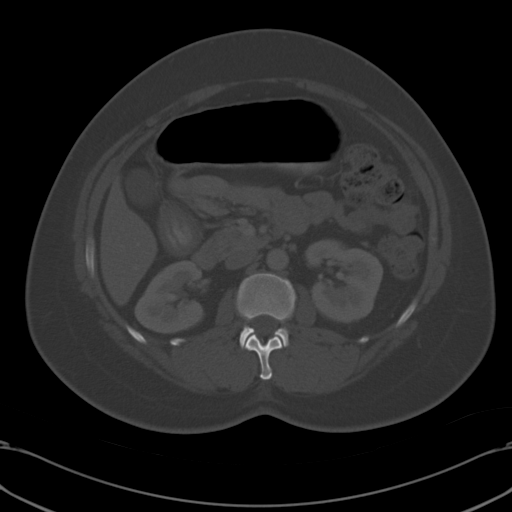
[im 73/99  soft-tissue]
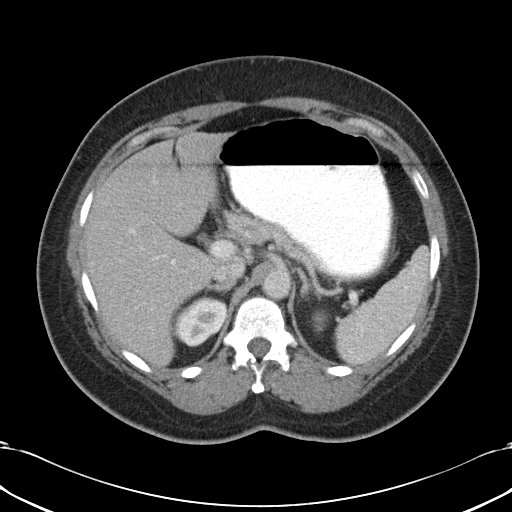
[im 78/99  soft-tissue]
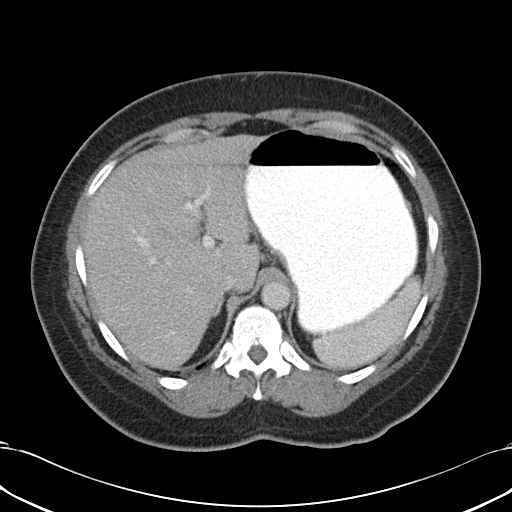
[im 78/99  lung]
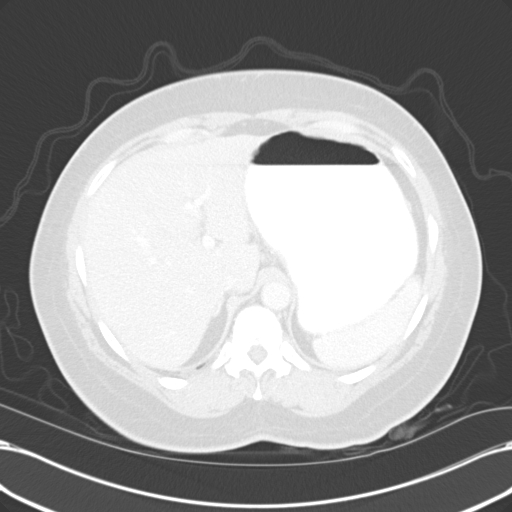
[im 83/99  soft-tissue]
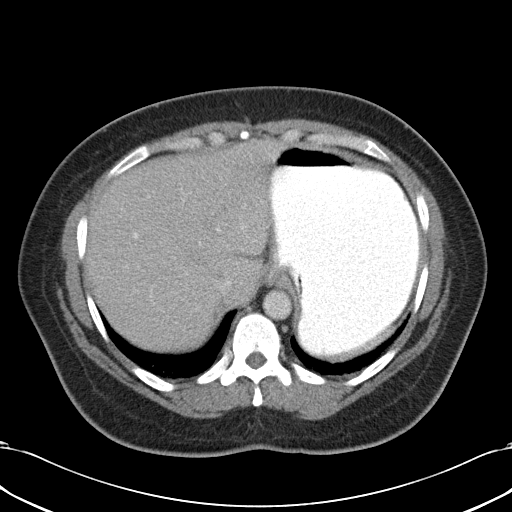
[im 83/99  lung]
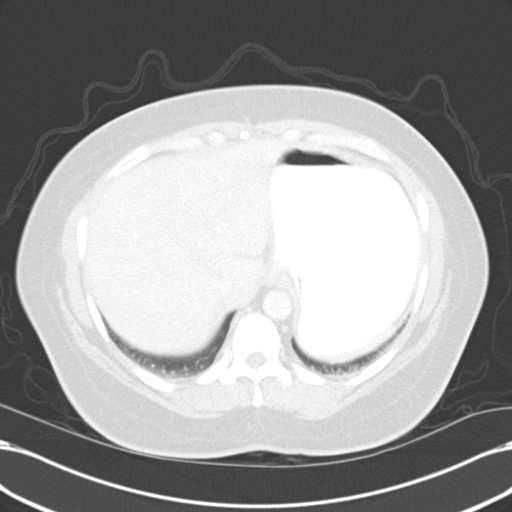
[im 88/99  lung]
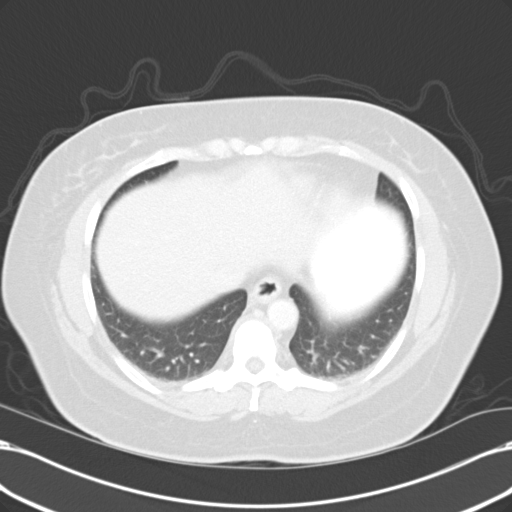
[im 93/99  soft-tissue]
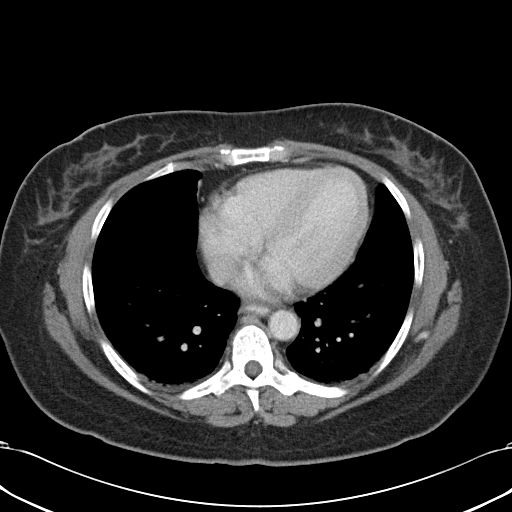
[im 93/99  lung]
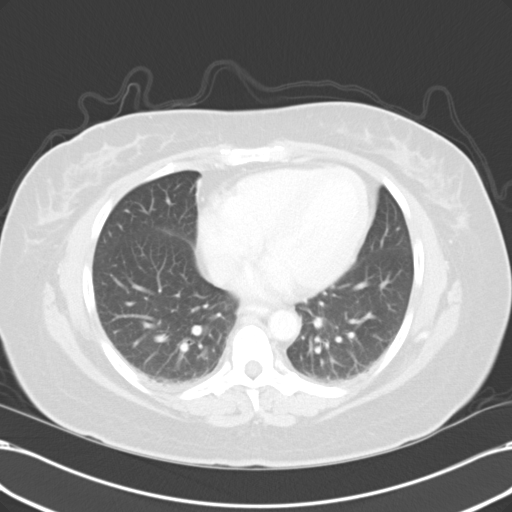

[14 of 32 positions shown; findings below may reference images not displayed]

FINDINGS: Dependent atelectasis in the lung bases.

The liver, spleen, gallbladder, pancreas, abdominal aorta, and
retroperitoneal lymph nodes are unremarkable.  There is a 14 mm
nodule in the right adrenal gland likely representing an adenoma.
There is a 2 mm stone in the upper pole of the right kidney without
evidence of obstruction.  No hydronephrosis in either kidney.  The
stomach is filled with contrast.  No contrast material is
demonstrated in the small bowel.  The gastric wall in the pyloric
region appears to be somewhat thickened.  Changes may represent
gastritis or peptic ulcer disease.  Small bowel and colon are not
abnormally distended. Colon is stool-filled diffusely.  No free air
or free fluid in the abdomen.

Pelvis:  Uterus and ovaries are not enlarged.  Small amount of free
fluid in the pelvis.  No loculated fluid collections.  Colonic
diverticula without evidence of diverticulitis.  The appendix is
not identified.  No significant lymphadenopathy in the pelvis.  Fat
in the inguinal canals.  Mild degenerative changes in the lumbar
spine.
IMPRESSION: Suggestion of wall thickening of the pyloric region of the stomach
which could represent gastritis or ulcer disease.  The appearance
is nonspecific.  Small amount of free fluid in the pelvis of
nonspecific etiology.  Stool filled colon.  Nonobstructing stone in
the upper pole of the right kidney.  Right adrenal gland nodule is
likely an adenoma.

## 2014-05-18 ENCOUNTER — Encounter (HOSPITAL_COMMUNITY): Payer: Self-pay | Admitting: Emergency Medicine

## 2014-05-18 ENCOUNTER — Emergency Department (HOSPITAL_COMMUNITY)
Admission: EM | Admit: 2014-05-18 | Discharge: 2014-05-18 | Disposition: A | Discharge status: Home / Self Care | Payer: 59 | Source: Home / Self Care

## 2014-05-18 DIAGNOSIS — H73893 Other specified disorders of tympanic membrane, bilateral: Secondary | ICD-10-CM

## 2014-05-18 DIAGNOSIS — H6980 Other specified disorders of Eustachian tube, unspecified ear: Secondary | ICD-10-CM

## 2014-05-18 DIAGNOSIS — R0982 Postnasal drip: Secondary | ICD-10-CM

## 2014-05-18 DIAGNOSIS — N390 Urinary tract infection, site not specified: Secondary | ICD-10-CM

## 2014-05-18 DIAGNOSIS — H73829 Atrophic nonflaccid tympanic membrane, unspecified ear: Secondary | ICD-10-CM

## 2014-05-18 LAB — POCT URINALYSIS DIP (DEVICE)
BILIRUBIN URINE: NEGATIVE
BILIRUBIN URINE: NEGATIVE
GLUCOSE, UA: NEGATIVE mg/dL
Glucose, UA: NEGATIVE mg/dL
HGB URINE DIPSTICK: NEGATIVE
HGB URINE DIPSTICK: NEGATIVE
LEUKOCYTES UA: NEGATIVE
Leukocytes, UA: NEGATIVE
Nitrite: NEGATIVE
Nitrite: NEGATIVE
PH: 5.5 (ref 5.0–8.0)
Protein, ur: 30 mg/dL — AB
Protein, ur: 30 mg/dL — AB
Urobilinogen, UA: 0.2 mg/dL (ref 0.0–1.0)
Urobilinogen, UA: 0.2 mg/dL (ref 0.0–1.0)
pH: 5.5 (ref 5.0–8.0)

## 2014-05-18 MED ORDER — ONDANSETRON HCL 4 MG PO TABS: 4.0000 mg | ORAL_TABLET | Freq: Four times a day (QID) | ORAL | Status: DC

## 2014-05-18 MED ORDER — NITROFURANTOIN MONOHYD MACRO 100 MG PO CAPS: 100.0000 mg | ORAL_CAPSULE | Freq: Two times a day (BID) | ORAL | Status: DC

## 2014-05-18 NOTE — ED Provider Notes (Signed)
Medical screening examination/treatment/procedure(s) were performed by non-physician practitioner and as supervising physician I was immediately available for consultation/collaboration.  Doshie Maggi, M.D.  Anola Mcgough C Sharron Petruska, MD 05/18/14 2229 

## 2014-05-18 NOTE — ED Notes (Signed)
Second UA run in error. Disregard d/t them being same results.

## 2014-05-18 NOTE — ED Provider Notes (Signed)
CSN: [id number]     Arrival date & time 05/18/14  1908 History   First MD Initiated Contact with Patient 05/18/14 2017     Chief Complaint  Patient presents with  . Urinary Tract Infection  . Sore Throat   (Consider location/radiation/quality/duration/timing/severity/associated sxs/prior Treatment) HPI Comments: This 53 year old female was diagnosed with UTI the lower 3 weeks ago. The culture report indicated no resistance to follow the antibiotic is Macrobid for patient report. There was no record in the Epic system. She is complaining of left flank pain, bladder tenderness, urinary urgency and frequency. Denies fever, nausea or vomiting. She is also developed a mild sore throat and left earache today.   Past Medical History  Diagnosis Date  . Epilepsy   . Bipolar 1 disorder   . Asthma   . Bipolar 1 disorder   . Epilepsy    Past Surgical History  Procedure Laterality Date  . Tonsillectomy    . Tubal ligation     Family History  Problem Relation Age of Onset  . Pancreatic cancer Mother   . Hypertension Mother   . Diabetes Mother   . Heart disease Father   . Gout Father   . Cirrhosis Father    History  Substance Use Topics  . Smoking status: Never Smoker   . Smokeless tobacco: Not on file  . Alcohol Use: No   OB History   Grav Para Term Preterm Abortions TAB SAB Ect Mult Living                 Review of Systems  Constitutional: Positive for activity change. Negative for fever, chills and fatigue.  HENT: Positive for congestion, postnasal drip and sore throat.   Eyes: Negative.   Respiratory: Negative for cough, shortness of breath and wheezing.   Cardiovascular: Negative.   Gastrointestinal: Negative.   Genitourinary: Positive for urgency, frequency, flank pain and pelvic pain. Negative for dysuria, vaginal bleeding, vaginal discharge and vaginal pain.  Neurological: Negative.     Allergies  Review of patient's allergies indicates no known allergies.  Home  Medications   Prior to Admission medications   Medication Sig Start Date End Date Taking? Authorizing Provider  clonazePAM (KLONOPIN) 1 MG tablet Take 1 mg by mouth at bedtime.    Yes Historical Provider, MD  MAGNESIUM PO Take 1 tablet by mouth daily.   Yes Historical Provider, MD  Multiple Vitamin (MULTIVITAMIN WITH MINERALS) TABS Take 1 tablet by mouth daily.   Yes Historical Provider, MD  albuterol (PROVENTIL HFA;VENTOLIN HFA) 108 (90 BASE) MCG/ACT inhaler Inhale 2 puffs into the lungs every 6 (six) hours as needed. For wheeze or shortness of breath    Historical Provider, MD  nitrofurantoin, macrocrystal-monohydrate, (MACROBID) 100 MG capsule Take 1 capsule (100 mg total) by mouth 2 (two) times daily. 05/18/14   Hayden Rasmussen, NP  ondansetron (ZOFRAN) 4 MG tablet Take 1 tablet (4 mg total) by mouth every 6 (six) hours. 05/18/14   Hayden Rasmussen, NP   BP 188/112  Pulse 87  Temp(Src) 98.9 F (37.2 C) (Oral)  Resp 19  SpO2 100%  LMP 04/08/2014 Physical Exam  Nursing note and vitals reviewed. Constitutional: She is oriented to person, place, and time. She appears well-developed and well-nourished. No distress.  HENT:  Mouth/Throat: No oropharyngeal exudate.  Bilat TM's clear and retracted OP with minimal erythema in a streaky fashion  Eyes: Conjunctivae and EOM are normal.  Neck: Normal range of motion. Neck supple.  Cardiovascular: Normal rate,  regular rhythm and normal heart sounds.   Pulmonary/Chest: Effort normal and breath sounds normal. No respiratory distress. She has no wheezes.  Abdominal: Soft.  Mild tenderness over suprapubic area  Musculoskeletal: She exhibits no edema and no tenderness.  Lymphadenopathy:    She has no cervical adenopathy.  Neurological: She is alert and oriented to person, place, and time.  Skin: Skin is warm and dry.  Psychiatric: She has a normal mood and affect.    ED Course  Procedures (including critical care time) Labs Review Labs Reviewed  POCT  URINALYSIS DIP (DEVICE) - Abnormal; Notable for the following:    Ketones, ur TRACE (*)    Protein, ur 30 (*)    All other components within normal limits  POCT URINALYSIS DIP (DEVICE) - Abnormal; Notable for the following:    Ketones, ur TRACE (*)    Protein, ur 30 (*)    All other components within normal limits  URINE CULTURE    Imaging Review No results found.   MDM   1. ETD (eustachian tube dysfunction)   2. Retraction of tympanic membrane of both ears   3. PND (post-nasal drip)   4. UTI (lower urinary tract infection)     Macrobid bid with food, and zofran prn nausea claritin and sudafed Pe for decongestant Lots of water Keep appt with your PCP 2 d.     Hayden Rasmussenavid John Williamsen, NP 05/18/14 2038

## 2014-05-18 NOTE — Discharge Instructions (Signed)
Urinary Tract Infection  Urinary tract infections (UTIs) can develop anywhere along your urinary tract. Your urinary tract is your body's drainage system for removing wastes and extra water. Your urinary tract includes two kidneys, two ureters, a bladder, and a urethra. Your kidneys are a pair of bean-shaped organs. Each kidney is about the size of your fist. They are located below your ribs, one on each side of your spine.  CAUSES  Infections are caused by microbes, which are microscopic organisms, including fungi, viruses, and bacteria. These organisms are so small that they can only be seen through a microscope. Bacteria are the microbes that most commonly cause UTIs.  SYMPTOMS   Symptoms of UTIs may vary by age and gender of the patient and by the location of the infection. Symptoms in young women typically include a frequent and intense urge to urinate and a painful, burning feeling in the bladder or urethra during urination. Older women and men are more likely to be tired, shaky, and weak and have muscle aches and abdominal pain. A fever may mean the infection is in your kidneys. Other symptoms of a kidney infection include pain in your back or sides below the ribs, nausea, and vomiting.  DIAGNOSIS  To diagnose a UTI, your caregiver will ask you about your symptoms. Your caregiver also will ask to provide a urine sample. The urine sample will be tested for bacteria and white blood cells. White blood cells are made by your body to help fight infection.  TREATMENT   Typically, UTIs can be treated with medication. Because most UTIs are caused by a bacterial infection, they usually can be treated with the use of antibiotics. The choice of antibiotic and length of treatment depend on your symptoms and the type of bacteria causing your infection.  HOME CARE INSTRUCTIONS   If you were prescribed antibiotics, take them exactly as your caregiver instructs you. Finish the medication even if you feel better after you  have only taken some of the medication.   Drink enough water and fluids to keep your urine clear or pale yellow.   Avoid caffeine, tea, and carbonated beverages. They tend to irritate your bladder.   Empty your bladder often. Avoid holding urine for long periods of time.   Empty your bladder before and after sexual intercourse.   After a bowel movement, women should cleanse from front to back. Use each tissue only once.  SEEK MEDICAL CARE IF:    You have back pain.   You develop a fever.   Your symptoms do not begin to resolve within 3 days.  SEEK IMMEDIATE MEDICAL CARE IF:    You have severe back pain or lower abdominal pain.   You develop chills.   You have nausea or vomiting.   You have continued burning or discomfort with urination.  MAKE SURE YOU:    Understand these instructions.   Will watch your condition.   Will get help right away if you are not doing well or get worse.  Document Released: 09/05/2005 Document Revised: 05/27/2012 Document Reviewed: 01/04/2012  [employer] Patient Information 2014 [employer]

## 2014-05-18 NOTE — ED Notes (Signed)
Had UTI 3 weeks ago and doctor said the only antibiotic that would work because of resistance was Macrobid QID.  She only took it for 4 days because it was making her sick.  He told her she could stop it.  She said she has pain in L kidney area. No fever.  No hx. of hypertension.  C/o sore throat onset yesterday. L ear pain onset today.

## 2014-05-20 LAB — URINE CULTURE
Colony Count: >=100000
Special Requests: NORMAL

## 2014-05-20 NOTE — ED Notes (Signed)
Urine culture:>100,000 colonies E. Coli.  Pt. adequately treated with Macrobid. Vassie Moselle 05/20/2014

## 2014-07-26 ENCOUNTER — Emergency Department (HOSPITAL_COMMUNITY): Payer: 59

## 2014-07-26 ENCOUNTER — Encounter (HOSPITAL_COMMUNITY): Payer: Self-pay | Admitting: Emergency Medicine

## 2014-07-26 ENCOUNTER — Inpatient Hospital Stay (HOSPITAL_COMMUNITY)
Admission: EM | Admit: 2014-07-26 | Discharge: 2014-07-28 | DRG: 291 | Disposition: A | Discharge status: Home / Self Care | Payer: 59 | Attending: Internal Medicine | Admitting: Internal Medicine

## 2014-07-26 DIAGNOSIS — D869 Sarcoidosis, unspecified: Secondary | ICD-10-CM | POA: Diagnosis present

## 2014-07-26 DIAGNOSIS — J45909 Unspecified asthma, uncomplicated: Secondary | ICD-10-CM | POA: Diagnosis present

## 2014-07-26 DIAGNOSIS — G40909 Epilepsy, unspecified, not intractable, without status epilepticus: Secondary | ICD-10-CM | POA: Diagnosis present

## 2014-07-26 DIAGNOSIS — Z8249 Family history of ischemic heart disease and other diseases of the circulatory system: Secondary | ICD-10-CM

## 2014-07-26 DIAGNOSIS — Z8349 Family history of other endocrine, nutritional and metabolic diseases: Secondary | ICD-10-CM | POA: Diagnosis not present

## 2014-07-26 DIAGNOSIS — I10 Essential (primary) hypertension: Secondary | ICD-10-CM | POA: Diagnosis present

## 2014-07-26 DIAGNOSIS — F319 Bipolar disorder, unspecified: Secondary | ICD-10-CM | POA: Diagnosis present

## 2014-07-26 DIAGNOSIS — R51 Headache: Secondary | ICD-10-CM

## 2014-07-26 DIAGNOSIS — J9600 Acute respiratory failure, unspecified whether with hypoxia or hypercapnia: Secondary | ICD-10-CM | POA: Diagnosis present

## 2014-07-26 DIAGNOSIS — I509 Heart failure, unspecified: Secondary | ICD-10-CM | POA: Diagnosis present

## 2014-07-26 DIAGNOSIS — I169 Hypertensive crisis, unspecified: Secondary | ICD-10-CM

## 2014-07-26 DIAGNOSIS — Z800 Family history of malignant neoplasm of digestive organs: Secondary | ICD-10-CM

## 2014-07-26 DIAGNOSIS — I5031 Acute diastolic (congestive) heart failure: Principal | ICD-10-CM

## 2014-07-26 DIAGNOSIS — Z833 Family history of diabetes mellitus: Secondary | ICD-10-CM

## 2014-07-26 DIAGNOSIS — N390 Urinary tract infection, site not specified: Secondary | ICD-10-CM

## 2014-07-26 DIAGNOSIS — R519 Headache, unspecified: Secondary | ICD-10-CM | POA: Diagnosis present

## 2014-07-26 DIAGNOSIS — J810 Acute pulmonary edema: Secondary | ICD-10-CM

## 2014-07-26 DIAGNOSIS — N179 Acute kidney failure, unspecified: Secondary | ICD-10-CM | POA: Diagnosis present

## 2014-07-26 DIAGNOSIS — I517 Cardiomegaly: Secondary | ICD-10-CM

## 2014-07-26 DIAGNOSIS — J9601 Acute respiratory failure with hypoxia: Secondary | ICD-10-CM | POA: Diagnosis present

## 2014-07-26 DIAGNOSIS — J811 Chronic pulmonary edema: Secondary | ICD-10-CM | POA: Diagnosis present

## 2014-07-26 DIAGNOSIS — R0602 Shortness of breath: Secondary | ICD-10-CM | POA: Diagnosis present

## 2014-07-26 HISTORY — DX: Sarcoidosis, unspecified: D86.9

## 2014-07-26 HISTORY — DX: Essential (primary) hypertension: I10

## 2014-07-26 LAB — CBC
HEMATOCRIT: 39.7 % (ref 36.0–46.0)
HEMOGLOBIN: 12.6 g/dL (ref 12.0–15.0)
MCH: 29 pg (ref 26.0–34.0)
MCHC: 31.7 g/dL (ref 30.0–36.0)
MCV: 91.3 fL (ref 78.0–100.0)
Platelets: 235 10*3/uL (ref 150–400)
RBC: 4.35 MIL/uL (ref 3.87–5.11)
RDW: 14.9 % (ref 11.5–15.5)
WBC: 8.4 10*3/uL (ref 4.0–10.5)

## 2014-07-26 LAB — MRSA PCR SCREENING: MRSA by PCR: NEGATIVE

## 2014-07-26 LAB — BASIC METABOLIC PANEL
Anion gap: 12 (ref 5–15)
BUN: 13 mg/dL (ref 6–23)
CHLORIDE: 104 meq/L (ref 96–112)
CO2: 23 mEq/L (ref 19–32)
Calcium: 8.7 mg/dL (ref 8.4–10.5)
Creatinine, Ser: 0.64 mg/dL (ref 0.50–1.10)
GFR calc Af Amer: >90 mL/min (ref >90)
GLUCOSE: 124 mg/dL — AB (ref 70–99)
Potassium: 3.6 mEq/L — ABNORMAL LOW (ref 3.7–5.3)
Sodium: 139 mEq/L (ref 137–147)

## 2014-07-26 LAB — SEDIMENTATION RATE: Sed Rate: 8 mm/hr (ref 0–22)

## 2014-07-26 LAB — PRO B NATRIURETIC PEPTIDE: Pro B Natriuretic peptide (BNP): 582.7 pg/mL — ABNORMAL HIGH (ref 0–125)

## 2014-07-26 LAB — TROPONIN I: Troponin I: <0.3 ng/mL (ref <0.30)

## 2014-07-26 LAB — I-STAT TROPONIN, ED: Troponin i, poc: 0.03 ng/mL (ref 0.00–0.08)

## 2014-07-26 MED ORDER — CARVEDILOL 6.25 MG PO TABS
6.2500 mg | ORAL_TABLET | Freq: Two times a day (BID) | ORAL | Status: DC
  Administered 2014-07-26 – 2014-07-28 (×4): 6.25 mg via ORAL
  Filled 2014-07-26 (×6): qty 1

## 2014-07-26 MED ORDER — SODIUM CHLORIDE 0.9 % IJ SOLN: 3.0000 mL | INTRAMUSCULAR | Status: DC | PRN

## 2014-07-26 MED ORDER — KETOROLAC TROMETHAMINE 30 MG/ML IJ SOLN
30.0000 mg | Freq: Four times a day (QID) | INTRAMUSCULAR | Status: DC | PRN
  Administered 2014-07-26 – 2014-07-28 (×4): 30 mg via INTRAVENOUS
  Filled 2014-07-26 (×5): qty 1

## 2014-07-26 MED ORDER — CARVEDILOL 3.125 MG PO TABS
3.1250 mg | ORAL_TABLET | Freq: Two times a day (BID) | ORAL | Status: DC
  Administered 2014-07-26: 3.125 mg via ORAL
  Filled 2014-07-26 (×3): qty 1

## 2014-07-26 MED ORDER — ASPIRIN EC 325 MG PO TBEC
325.0000 mg | DELAYED_RELEASE_TABLET | Freq: Every day | ORAL | Status: DC
  Administered 2014-07-26 – 2014-07-28 (×3): 325 mg via ORAL
  Filled 2014-07-26 (×3): qty 1

## 2014-07-26 MED ORDER — PROMETHAZINE HCL 25 MG/ML IJ SOLN
25.0000 mg | Freq: Four times a day (QID) | INTRAMUSCULAR | Status: DC | PRN
  Administered 2014-07-27: 25 mg via INTRAVENOUS
  Filled 2014-07-26: qty 1

## 2014-07-26 MED ORDER — NITROGLYCERIN IN D5W 200-5 MCG/ML-% IV SOLN
2.0000 ug/min | INTRAVENOUS | Status: DC
  Administered 2014-07-26: 5 ug/min via INTRAVENOUS

## 2014-07-26 MED ORDER — SODIUM CHLORIDE 0.9 % IV SOLN: 250.0000 mL | INTRAVENOUS | Status: DC | PRN

## 2014-07-26 MED ORDER — HYDROCODONE-ACETAMINOPHEN 5-325 MG PO TABS
2.0000 | ORAL_TABLET | Freq: Once | ORAL | Status: AC
  Administered 2014-07-26: 2 via ORAL
  Filled 2014-07-26: qty 2

## 2014-07-26 MED ORDER — LISINOPRIL 10 MG PO TABS
10.0000 mg | ORAL_TABLET | Freq: Every day | ORAL | Status: DC
  Administered 2014-07-26 – 2014-07-28 (×3): 10 mg via ORAL
  Filled 2014-07-26 (×3): qty 1

## 2014-07-26 MED ORDER — CLONAZEPAM 1 MG PO TABS
1.0000 mg | ORAL_TABLET | Freq: Every day | ORAL | Status: DC
  Administered 2014-07-26 – 2014-07-27 (×3): 1 mg via ORAL
  Filled 2014-07-26 (×3): qty 1

## 2014-07-26 MED ORDER — POTASSIUM CHLORIDE CRYS ER 20 MEQ PO TBCR
20.0000 meq | EXTENDED_RELEASE_TABLET | Freq: Two times a day (BID) | ORAL | Status: AC
  Administered 2014-07-26 – 2014-07-27 (×4): 20 meq via ORAL
  Filled 2014-07-26 (×2): qty 1
  Filled 2014-07-26: qty 2
  Filled 2014-07-26 (×2): qty 1

## 2014-07-26 MED ORDER — ONDANSETRON HCL 4 MG/2ML IJ SOLN: 4.0000 mg | INTRAMUSCULAR | Status: DC | PRN

## 2014-07-26 MED ORDER — ONDANSETRON HCL 4 MG/2ML IJ SOLN
4.0000 mg | Freq: Four times a day (QID) | INTRAMUSCULAR | Status: DC | PRN
Start: 2014-07-26 — End: 2014-07-26
  Administered 2014-07-26: 4 mg via INTRAVENOUS
  Filled 2014-07-26 (×3): qty 2

## 2014-07-26 MED ORDER — ENOXAPARIN SODIUM 40 MG/0.4ML ~~LOC~~ SOLN
40.0000 mg | SUBCUTANEOUS | Status: DC
  Administered 2014-07-26 – 2014-07-28 (×3): 40 mg via SUBCUTANEOUS
  Filled 2014-07-26 (×3): qty 0.4

## 2014-07-26 MED ORDER — ACETAMINOPHEN 325 MG PO TABS
650.0000 mg | ORAL_TABLET | ORAL | Status: DC | PRN
  Administered 2014-07-26: 650 mg via ORAL
  Filled 2014-07-26: qty 2

## 2014-07-26 MED ORDER — FUROSEMIDE 10 MG/ML IJ SOLN
40.0000 mg | Freq: Two times a day (BID) | INTRAMUSCULAR | Status: DC
  Administered 2014-07-26 (×2): 40 mg via INTRAVENOUS
  Filled 2014-07-26 (×4): qty 4

## 2014-07-26 MED ORDER — HYDRALAZINE HCL 20 MG/ML IJ SOLN
10.0000 mg | Freq: Once | INTRAMUSCULAR | Status: AC
  Administered 2014-07-26: 10 mg via INTRAVENOUS
  Filled 2014-07-26: qty 1

## 2014-07-26 MED ORDER — ADULT MULTIVITAMIN W/MINERALS CH
1.0000 | ORAL_TABLET | Freq: Every day | ORAL | Status: DC
  Administered 2014-07-26 – 2014-07-28 (×3): 1 via ORAL
  Filled 2014-07-26 (×3): qty 1

## 2014-07-26 MED ORDER — NITROGLYCERIN IN D5W 200-5 MCG/ML-% IV SOLN
50.0000 ug/min | INTRAVENOUS | Status: DC
  Filled 2014-07-26: qty 250

## 2014-07-26 MED ORDER — METOCLOPRAMIDE HCL 5 MG/ML IJ SOLN
5.0000 mg | Freq: Once | INTRAMUSCULAR | Status: AC
  Administered 2014-07-26: 5 mg via INTRAVENOUS
  Filled 2014-07-26: qty 1

## 2014-07-26 MED ORDER — ONDANSETRON HCL 4 MG/2ML IJ SOLN
4.0000 mg | Freq: Once | INTRAMUSCULAR | Status: AC
  Administered 2014-07-26: 4 mg via INTRAVENOUS
  Filled 2014-07-26: qty 2

## 2014-07-26 MED ORDER — ASPIRIN 81 MG PO CHEW
324.0000 mg | CHEWABLE_TABLET | Freq: Once | ORAL | Status: AC
  Administered 2014-07-26: 324 mg via ORAL
  Filled 2014-07-26: qty 4

## 2014-07-26 MED ORDER — FUROSEMIDE 10 MG/ML IJ SOLN
40.0000 mg | Freq: Once | INTRAMUSCULAR | Status: AC
  Administered 2014-07-26: 40 mg via INTRAVENOUS
  Filled 2014-07-26: qty 4

## 2014-07-26 MED ORDER — SODIUM CHLORIDE 0.9 % IJ SOLN
3.0000 mL | Freq: Two times a day (BID) | INTRAMUSCULAR | Status: DC
  Administered 2014-07-26: 3 mL via INTRAVENOUS

## 2014-07-26 MED ORDER — TRAMADOL HCL 50 MG PO TABS
50.0000 mg | ORAL_TABLET | Freq: Four times a day (QID) | ORAL | Status: DC | PRN
  Administered 2014-07-26 – 2014-07-27 (×3): 50 mg via ORAL
  Filled 2014-07-26 (×3): qty 1

## 2014-07-26 NOTE — ED Notes (Signed)
Pt arrives with sudden onset SHOB while cleaning today, no use of checmicals. Room air sats 88%, EMS placed on CPAP. Audible crackles bilaterally. Medical hx of anxiety, no other dx.

## 2014-07-26 NOTE — Progress Notes (Signed)
*  PRELIMINARY RESULTS* Echocardiogram 2D Echocardiogram has been performed.  Jeryl ColumbiaLLIOTT, Bertrice Leder 07/26/2014, 1:48 PM

## 2014-07-26 NOTE — ED Notes (Signed)
Admitting MD at bedside.

## 2014-07-26 NOTE — Progress Notes (Signed)
[facility]  Prescott ParmaRobin B Johnson ZOX:[id number] DOB: [birth year] DOA: 07/26/2014 PCP: Thayer HeadingsMACKENZIE,BRIAN, MD  Brief narrative: 53 y.o. female with a history of HTN, Sarcoid, and Bipolar disorder who presented to the ED with complaints of sudden onset of SOB and orthopnea. She denied chest pain, and was evaluated in the ED and found to have Acute Respiratory Failure with hypoxia and O2 Sats in the 80's and was placed on BIPAP. An X-Ray revealed acute Pulmonary Edema.  HPI/Subjective: Alert and endorsing HA- no SOB  Assessment/Plan:    Acute respiratory failure with hypoxia/Pulmonary edema -resolving and has weaned from BiPAP to [place] oxygen -IV NTG has been weaned/dc'd -ECHO with preserved LV fnx and grade 1 diastolic dysfunction -suspect uncontrolled HTN as culprit -has diuresed 1200 cc since admit    Malignant HTN / hypertensive urgency  -based on med rec was NOT on meds prior to admit -started on Coreg and Lisinopril this admission    Sarcoidosis    Bipolar 1 disorder -resume home meds    Headache -Ultram was ineffective- Vicodin caused nausea- will try Toradol prn -likely from IV NTG but if persists may need to check CT Head  DVT prophylaxis: Lovenox Code Status: Full Family Communication: Patient and family at bedside Disposition Plan/Expected LOS: Transfer to floor  Consultants: None  Procedures: 2-D echocardiogram  - Left ventricle: The cavity size was normal. There was moderate concentric hypertrophy. Systolic function was normal. The estimated ejection fraction was in the range of 55% to 60%. Wall motion was normal; there were no regional wall motion abnormalities. Doppler parameters are consistent with abnormal left ventricular relaxation (grade 1 diastolic dysfunction). The E/e&' ratio is between 8-15, suggesting indeterminate LV filling pressure. - Left atrium: The atrium was normal in size.  Cultures: Blood cultures  pending  Antibiotics: None  Objective: Blood pressure 146/80, pulse 65, temperature 98.2 F (36.8 C), temperature source Oral, resp. rate 20, height 5\' 2"  (1.575 m), weight 166 lb 7.2 oz (75.5 kg), last menstrual period 07/12/2014, SpO2 99.00%.  Intake/Output Summary (Last 24 hours) at 07/26/14 1234 Last data filed at 07/26/14 1000  Gross per 24 hour  Intake 244.83 ml  Output   1400 ml  Net -1155.17 ml   Exam: Follow up exam completed. Patient admitted at 5:31 AM today.  Scheduled Meds:  Scheduled Meds: . aspirin EC  325 mg Oral Daily  . carvedilol  6.25 mg Oral BID WC  . clonazePAM  1 mg Oral QHS  . enoxaparin (LOVENOX) injection  40 mg Subcutaneous Q24H  . furosemide  40 mg Intravenous Q12H  . lisinopril  10 mg Oral Daily  . multivitamin with minerals  1 tablet Oral Daily  . potassium chloride  20 mEq Oral BID  . sodium chloride  3 mL Intravenous Q12H   Data Reviewed: Basic Metabolic Panel:  Recent Labs Lab 07/26/14 0306  NA 139  K 3.6*  CL 104  CO2 23  GLUCOSE 124*  BUN 13  CREATININE 0.64  CALCIUM 8.7   Liver Function Tests: No results found for this basename: AST, ALT, ALKPHOS, BILITOT, PROT, ALBUMIN,  in the last 168 hours No results found for this basename: LIPASE, AMYLASE,  in the last 168 hours No results found for this basename: AMMONIA,  in the last 168 hours CBC:  Recent Labs Lab 07/26/14 0306  WBC 8.4  HGB 12.6  HCT 39.7  MCV 91.3  PLT 235   Cardiac Enzymes:  Recent Labs Lab 07/26/14 0610 07/26/14 1050  TROPONINI <0.30 <0.30   BNP (last 3 results)  Recent Labs  07/26/14 0315  PROBNP 582.7*    Recent Results (from the past 240 hour(s))  MRSA PCR SCREENING     Status: None   Collection Time    07/26/14  6:34 AM      Result Value Ref Range Status   MRSA by PCR NEGATIVE  NEGATIVE Final   Comment:            The GeneXpert MRSA Assay (FDA     approved for NASAL specimens     only), is one component of a     comprehensive  MRSA colonization     surveillance program. It is not     intended to diagnose MRSA     infection nor to guide or     monitor treatment for     MRSA infections.     Studies:  Recent x-ray studies have been reviewed in detail by the Attending Physician  Time spent :  25+ mins      Junious Silk, ANP [facility] Office  [phone] Pager [phone]   **If unable to reach the above provider after paging please contact the Flow Manager @ [phone]  On-Call/Text Page:      [email or web address]      password [email or web address]  If 7PM-7AM, please contact night-coverage [email or web address] Password [email or web address] 07/26/2014, 12:34 PM   LOS: 0 days   I have personally examined this patient and reviewed the entire database. I have reviewed the above note, made any necessary editorial changes, and agree with its content.  Lonia Blood, MD [facility]

## 2014-07-26 NOTE — H&P (Signed)
[facility] Admission History and Physical       Mikayla Sweeney ZOX:[id number] DOB: [birth year] DOA: 07/26/2014  Referring physician:  EDP PCP: Default, Provider, MD  Specialists:   Chief Complaint:   HPI: Mikayla Sweeney is a 53 y.o. female with a history of HTN, Sarcoid, and Bipolar disorder who presents to the ED with complaints of sudden onset of SOB and orthopnea.   She denies chest pain, and was evaluated in the ED and found to have Acute Respiratory Failure with hypoxia and O2 Sats in the 80's and was placed on BIPAP.  An X-Ray revealed acute Pulmonary Edema.    She was referred for medical admission.      Review of Systems:  Constitutional: No Weight Loss, No Weight Gain, Night Sweats, Fevers, Chills, Dizziness, Fatigue, or Generalized Weakness HEENT: No Headaches, Difficulty Swallowing,Tooth/Dental Problems,Sore Throat,  No Sneezing, Rhinitis, Ear Ache, Nasal Congestion, or Post Nasal Drip,  Cardio-vascular:  No Chest pain, +Orthopnea, PND, Edema in Lower Extremities, Anasarca, Dizziness, Palpitations  Resp: +Dyspnea, No DOE, No Cough, No Hemoptysis, No Wheezing.    GI: No Heartburn, Indigestion, Abdominal Pain, Nausea, Vomiting, Diarrhea, Hematemesis, Hematochezia, Melena, Change in Bowel Habits,  Loss of Appetite  GU: No Dysuria, Change in Color of Urine, No Urgency or Frequency, No Flank pain.  Musculoskeletal: No Joint Pain or Swelling, No Decreased Range of Motion, No Back Pain.  Neurologic: No Syncope, No Seizures, Muscle Weakness, Paresthesia, Vision Disturbance or Loss, No Diplopia, No Vertigo, No Difficulty Walking,  Skin: No Rash or Lesions. Psych: No Change in Mood or Affect, No Depression or Anxiety, No Memory loss, No Confusion, or Hallucinations   Past Medical History  Diagnosis Date  . Epilepsy   . Bipolar 1 disorder   . Asthma   . Bipolar 1 disorder   . Epilepsy   . Sarcoidosis   . Hypertension     Past Surgical History  Procedure Laterality  Date  . Tonsillectomy    . Tubal ligation       Prior to Admission medications   Medication Sig Start Date End Date Taking? Authorizing Provider  clonazePAM (KLONOPIN) 1 MG tablet Take 1 mg by mouth at bedtime.    Yes Historical Provider, MD  MAGNESIUM PO Take 1 tablet by mouth daily.   Yes Historical Provider, MD  Multiple Vitamin (MULTIVITAMIN WITH MINERALS) TABS Take 1 tablet by mouth daily.   Yes Historical Provider, MD  albuterol (PROVENTIL HFA;VENTOLIN HFA) 108 (90 BASE) MCG/ACT inhaler Inhale 2 puffs into the lungs every 6 (six) hours as needed. For wheeze or shortness of breath    Historical Provider, MD    No Known Allergies   Social History:  reports that she has never smoked. She does not have any smokeless tobacco history on file. She reports that she does not drink alcohol or use illicit drugs.     Family History  Problem Relation Age of Onset  . Pancreatic cancer Mother   . Hypertension Mother   . Diabetes Mother   . Heart disease Father   . Gout Father   . Cirrhosis Father        Physical Exam:  GEN:  Pleasant well Nourished and Well Developed  53 y.o. African American  female examined and in Acute Distress;  Filed Vitals:   07/26/14 0330 07/26/14 0400 07/26/14 0430 07/26/14 0434  BP: 194/87 186/96 187/92 187/92  Pulse: 78 80 82   Temp:      TempSrc:  Resp: 19 19 22    Height:      Weight:      SpO2: 100% 100% 100%    Blood pressure 187/92, pulse 82, temperature 98 F (36.7 C), temperature source Oral, resp. rate 22, height 5\' 2"  (1.575 m), weight 75.751 kg (167 lb), last menstrual period 07/12/2014, SpO2 100.00%. PSYCH: She is alert and oriented x4; does not appear anxious does not appear depressed; affect is normal HEENT: Normocephalic and Atraumatic, Mucous membranes pink; PERRLA; EOM intact; Fundi:  Benign;  No scleral icterus, Nares: Patent, Oropharynx: Clear, Fair Dentition,    Neck:  FROM, No Cervical Lymphadenopathy nor Thyromegaly or Carotid  Bruit; No JVD; Breasts:: Not examined CHEST WALL: No tenderness CHEST: Normal respiration, clear to auscultation bilaterally HEART: Regular rate and rhythm; no murmurs rubs or gallops BACK: No kyphosis or scoliosis; No CVA tenderness ABDOMEN: Positive Bowel Sounds, Soft Non-Tender; No Masses, No Organomegaly. Rectal Exam: Not done EXTREMITIES: No Cyanosis, Clubbing, or Edema; No Ulcerations. Genitalia: not examined PULSES: 2+ and symmetric SKIN: Normal hydration no rash or ulceration CNS: Alert and oriented x 4, No Focal Deficits  Vascular: pulses palpable throughout    Labs on Admission:  Basic Metabolic Panel:  Recent Labs Lab 07/26/14 0306  NA 139  K 3.6*  CL 104  CO2 23  GLUCOSE 124*  BUN 13  CREATININE 0.64  CALCIUM 8.7   Liver Function Tests: No results found for this basename: AST, ALT, ALKPHOS, BILITOT, PROT, ALBUMIN,  in the last 168 hours No results found for this basename: LIPASE, AMYLASE,  in the last 168 hours No results found for this basename: AMMONIA,  in the last 168 hours CBC:  Recent Labs Lab 07/26/14 0306  WBC 8.4  HGB 12.6  HCT 39.7  MCV 91.3  PLT 235   Cardiac Enzymes: No results found for this basename: CKTOTAL, CKMB, CKMBINDEX, TROPONINI,  in the last 168 hours  BNP (last 3 results)  Recent Labs  07/26/14 0315  PROBNP 582.7*   CBG: No results found for this basename: GLUCAP,  in the last 168 hours  Radiological Exams on Admission: Dg Chest Port 1 View  07/26/2014   CLINICAL DATA:  Sudden onset chest pain.  EXAM: PORTABLE CHEST - 1 VIEW  COMPARISON:  12/27/2012  FINDINGS: Diffuse airspace disease which is symmetric. No effusion or cardiomegaly. Negative aortic contours. No pneumothorax.  IMPRESSION: Diffuse airspace disease with symmetry favoring pneumonitis/noncardiogenic edema over pneumonia.   Electronically Signed   By: Tiburcio Pea M.D.   On: 07/26/2014 03:55     EKG: Independently reviewed.    Assessment/Plan:   53  y.o. female with  Principal Problem:   1.   Pulmonary edema   BIPAP   Placed on Acute CHF Protocol to Diurese with IV Lasix, 2D ECHO ordered   IV NTG Drip   Cycle Troponins  Active Problems:   2.   Acute respiratory failure- Due to #1   BIPAP and O2    Monitor O2 Sats     3.   Benign essential HTN/ Reactive HTN Urgency   IV NTG Drip      4.   Sarcoidosis   Dormant     5.   Bipolar 1 disorder   Stable     6.  DVT Prophylaxis    Lovenox     Code Status:    FULL CODE   Family Communication:   No Family Present  Disposition Plan:     Inpatient To  stepdown Bed  Time spent:  60 Minutes  Ron Parker [facility] Pager [phone]   If 7AM -7PM Please Contact the Day Rounding Team MD for [facility]  If 7PM-7AM, Please Contact Night-Floor Coverage  [email or web address] Password [facility] 07/26/2014, 5:31 AM

## 2014-07-26 NOTE — Care Management Note (Addendum)
    Page 1 of 1   07/28/2014     3:11:47 PM CARE MANAGEMENT NOTE 07/28/2014  Patient:  Mikayla Sweeney,Mikayla Sweeney   Account Number:  [id number]  Date Initiated:  07/26/2014  Documentation initiated by:  Junius CreamerWELL,DEBBIE  Subjective/Objective Assessment:   adm w shortness of breath     Action/Plan:   lives w husband   Anticipated DC Date:  07/28/2014   Anticipated DC Plan:  HOME W HOME HEALTH SERVICES      DC Planning Services  CM consult      Choice offered to / List presented to:             Status of service:  Completed, signed off Medicare Important Message given?  NO (If response is "NO", the following Medicare IM given date fields will be blank) Date Medicare IM given:   Medicare IM given by:   Date Additional Medicare IM given:   Additional Medicare IM given by:    Discharge Disposition:  HOME/SELF CARE  Per UR Regulation:  Reviewed for med. necessity/level of care/duration of stay  If discussed at Long Length of Stay Meetings, dates discussed:    Comments:  07/28/14 1510 Letha Capeeborah Zahmir Lalla RN, BSN [phone] patient is for dc today, no needs anticipated.  07/27/14 1729 Letha Capeeborah Naidelyn Parrella RN, BSN [phone] patient lives with spouse.  NCM will continue to follow for dc needs.  8/17 0947 debbie dowell rn,bsn spoke w pt. she has [employer] ins. her pcp is dr Shary Decampbrian mckenzie. she will be able to follow up w him post disch.

## 2014-07-26 NOTE — ED Notes (Signed)
MD at bedside. 

## 2014-07-26 NOTE — Progress Notes (Signed)
Pt taken off Bipap at this time due to nausea. RN aware  Pt placed on 5L [place] sats 96%

## 2014-07-26 NOTE — ED Notes (Signed)
1 SL nitro and 4MG  zofran PTA

## 2014-07-26 NOTE — ED Provider Notes (Signed)
CSN: [id number]     Arrival date & time 07/26/14  0255 History   First MD Initiated Contact with Patient 07/26/14 [phone]     Chief Complaint  Patient presents with  . Shortness of Breath     (Consider location/radiation/quality/duration/timing/severity/associated sxs/prior Treatment) HPI This patient is a generally healthy 53 yo woman who presents with abrupt onset of SOB which developed as she laid down to sleep. The patient called 911 and paramedics arrived to find her tachypneic and hypoxic with RA sats in the mid 80s with bibasilar rales.   The patient was placed on BiPAP. She says this has helped her feeling of SOB. She has developed a minimally productive cough since coming into the ED. She has not had chest pain. She noticed some swelling of her feet about a week ago but, reports that this resolved without intervention and has not recurred.   The patient is noted to be hyeprtensive on arrival to the ED but denies any history of HTN. She reports that she has felt a little "achy" over the past few days. No chills or fever. No wheezing. No history of similar sx.   Past Medical History  Diagnosis Date  . Epilepsy   . Bipolar 1 disorder   . Asthma   . Bipolar 1 disorder   . Epilepsy    Past Surgical History  Procedure Laterality Date  . Tonsillectomy    . Tubal ligation     Family History  Problem Relation Age of Onset  . Pancreatic cancer Mother   . Hypertension Mother   . Diabetes Mother   . Heart disease Father   . Gout Father   . Cirrhosis Father    History  Substance Use Topics  . Smoking status: Never Smoker   . Smokeless tobacco: Not on file  . Alcohol Use: No   OB History   Grav Para Term Preterm Abortions TAB SAB Ect Mult Living                 Review of Systems  10 point review of symptoms obtained and is negative with the exceptions of symptoms noted abov.e   Allergies  Review of patient's allergies indicates no known allergies.  Home Medications    Prior to Admission medications   Medication Sig Start Date End Date Taking? Authorizing Provider  albuterol (PROVENTIL HFA;VENTOLIN HFA) 108 (90 BASE) MCG/ACT inhaler Inhale 2 puffs into the lungs every 6 (six) hours as needed. For wheeze or shortness of breath    Historical Provider, MD  clonazePAM (KLONOPIN) 1 MG tablet Take 1 mg by mouth at bedtime.     Historical Provider, MD  MAGNESIUM PO Take 1 tablet by mouth daily.    Historical Provider, MD  Multiple Vitamin (MULTIVITAMIN WITH MINERALS) TABS Take 1 tablet by mouth daily.    Historical Provider, MD  nitrofurantoin, macrocrystal-monohydrate, (MACROBID) 100 MG capsule Take 1 capsule (100 mg total) by mouth 2 (two) times daily. 05/18/14   Hayden Rasmussen, NP  ondansetron (ZOFRAN) 4 MG tablet Take 1 tablet (4 mg total) by mouth every 6 (six) hours. 05/18/14   Hayden Rasmussen, NP   Pulse 103  Temp(Src) 98 F (36.7 C) (Oral)  Ht 5\' 2"  (1.575 m)  Wt 167 lb (75.751 kg)  BMI 30.54 kg/m2  SpO2 94%  LMP 07/12/2014 Physical Exam  Gen: well nourished and well developed appearing Head: NCAT Ears: normal to inspection Nose: normal to inspection, no epistaxis or drainage Mouth: oral mucsoa is  well hydrated appearing, normal posterior oropharynx Neck: supple, no stridor CV: RRR, no murmur, palpable peripheral pulses Resp: tachypneic, bibasilar rales and course expiratory breath sounds.  RR 32 to 40/min Abd: soft, nontender, nondistended Extremities: normal to inspection, no edema, no calf ttp  Skin: warm and dry Neuro: CN ii - XII, no focal deficitis Psyche; normal affect, cooperative.   ED Course  Procedures (including critical care time) Labs Review  Results for orders placed during the hospital encounter of 07/26/14 (from the past 24 hour(s))  CBC     Status: None   Collection Time    07/26/14  3:06 AM      Result Value Ref Range   WBC 8.4  4.0 - 10.5 K/uL   RBC 4.35  3.87 - 5.11 MIL/uL   Hemoglobin 12.6  12.0 - 15.0 g/dL   HCT 16.139.7   09.636.0 - 04.546.0 %   MCV 91.3  78.0 - 100.0 fL   MCH 29.0  26.0 - 34.0 pg   MCHC 31.7  30.0 - 36.0 g/dL   RDW 40.914.9  81.111.5 - 91.415.5 %   Platelets 235  150 - 400 K/uL  BASIC METABOLIC PANEL     Status: Abnormal   Collection Time    07/26/14  3:06 AM      Result Value Ref Range   Sodium 139  137 - 147 mEq/L   Potassium 3.6 (*) 3.7 - 5.3 mEq/L   Chloride 104  96 - 112 mEq/L   CO2 23  19 - 32 mEq/L   Glucose, Bld 124 (*) 70 - 99 mg/dL   BUN 13  6 - 23 mg/dL   Creatinine, Ser 7.820.64  0.50 - 1.10 mg/dL   Calcium 8.7  8.4 - 95.610.5 mg/dL   GFR calc non Af Amer >90  >90 mL/min   GFR calc Af Amer >90  >90 mL/min   Anion gap 12  5 - 15  PRO B NATRIURETIC PEPTIDE     Status: Abnormal   Collection Time    07/26/14  3:15 AM      Result Value Ref Range   Pro B Natriuretic peptide (BNP) 582.7 (*) 0 - 125 pg/mL  I-STAT TROPOININ, ED     Status: None   Collection Time    07/26/14  3:34 AM      Result Value Ref Range   Troponin i, poc 0.03  0.00 - 0.08 ng/mL   Comment 3             EKG: nsr, no acute ischemic changes, normal intervals, normal axis, normal qrs complex  CXR: bilateral interstitial infiltrates, no cardiomegaly.  Pulmonary edema v. Pneumonitis.   CRITICAL CARE Performed by: Brandt LoosenManly, Julie   Total critical care time: 6466m  Critical care time was exclusive of separately billable procedures and treating other patients.  Critical care was necessary to treat or prevent imminent or life-threatening deterioration.  Critical care was time spent personally by me on the following activities: development of treatment plan with patient and/or surrogate as well as nursing, discussions with consultants, evaluation of patient's response to treatment, examination of patient, obtaining history from patient or surrogate, ordering and performing treatments and interventions, ordering and review of laboratory studies, ordering and review of radiographic studies, pulse oximetry and re-evaluation of patient's  condition.    MDM   DDX: pneumonia, ACS, pulmonary hemorrhage, pulmonary edema, interstitial pneumonitis.   ED work up is most consistent with flash pulmonary edema secondary to HTN.  We are treating with Lasix & NTG. Will tx with ASA as well. The patient continues to require BiPAP. Case discussed with DR. Lovell Sheehan who has accepted the patient to the SDU.     Brandt Loosen, MD 07/26/14 [id number]

## 2014-07-27 ENCOUNTER — Inpatient Hospital Stay (HOSPITAL_COMMUNITY): Payer: 59

## 2014-07-27 ENCOUNTER — Encounter (HOSPITAL_COMMUNITY): Payer: Self-pay | Admitting: Cardiology

## 2014-07-27 DIAGNOSIS — N179 Acute kidney failure, unspecified: Secondary | ICD-10-CM

## 2014-07-27 DIAGNOSIS — I5031 Acute diastolic (congestive) heart failure: Principal | ICD-10-CM

## 2014-07-27 DIAGNOSIS — R51 Headache: Secondary | ICD-10-CM

## 2014-07-27 DIAGNOSIS — J810 Acute pulmonary edema: Secondary | ICD-10-CM

## 2014-07-27 LAB — URINE MICROSCOPIC-ADD ON

## 2014-07-27 LAB — URINALYSIS, ROUTINE W REFLEX MICROSCOPIC
Bilirubin Urine: NEGATIVE
Glucose, UA: NEGATIVE mg/dL
Hgb urine dipstick: NEGATIVE
KETONES UR: NEGATIVE mg/dL
NITRITE: POSITIVE — AB
PH: 5 (ref 5.0–8.0)
Protein, ur: NEGATIVE mg/dL
Specific Gravity, Urine: 1.019 (ref 1.005–1.030)
UROBILINOGEN UA: 0.2 mg/dL (ref 0.0–1.0)

## 2014-07-27 LAB — BASIC METABOLIC PANEL
ANION GAP: 14 (ref 5–15)
BUN: 18 mg/dL (ref 6–23)
CO2: 27 mEq/L (ref 19–32)
Calcium: 9 mg/dL (ref 8.4–10.5)
Chloride: 98 mEq/L (ref 96–112)
Creatinine, Ser: 1.41 mg/dL — ABNORMAL HIGH (ref 0.50–1.10)
GFR calc Af Amer: 48 mL/min — ABNORMAL LOW (ref >90)
GFR, EST NON AFRICAN AMERICAN: 42 mL/min — AB (ref >90)
Glucose, Bld: 98 mg/dL (ref 70–99)
POTASSIUM: 3.5 meq/L — AB (ref 3.7–5.3)
SODIUM: 139 meq/L (ref 137–147)

## 2014-07-27 LAB — TSH: TSH: 1.75 u[IU]/mL (ref 0.350–4.500)

## 2014-07-27 MED ORDER — FUROSEMIDE 20 MG PO TABS
20.0000 mg | ORAL_TABLET | Freq: Every day | ORAL | Status: DC
  Filled 2014-07-27: qty 1

## 2014-07-27 MED ORDER — FUROSEMIDE 10 MG/ML IJ SOLN
40.0000 mg | Freq: Every day | INTRAMUSCULAR | Status: DC
  Administered 2014-07-27: 40 mg via INTRAVENOUS

## 2014-07-27 MED ORDER — METOCLOPRAMIDE HCL 10 MG PO TABS
10.0000 mg | ORAL_TABLET | Freq: Once | ORAL | Status: AC
  Administered 2014-07-27: 10 mg via ORAL
  Filled 2014-07-27: qty 1

## 2014-07-27 MED ORDER — FUROSEMIDE 20 MG PO TABS
20.0000 mg | ORAL_TABLET | Freq: Every day | ORAL | Status: DC
  Administered 2014-07-28: 20 mg via ORAL
  Filled 2014-07-27: qty 1

## 2014-07-27 NOTE — Consult Note (Signed)
Primary cardiologist: New  HPI: 53 year old female for evaluation of acute diastolic congestive heart failure. No prior cardiac history. Patient states that for the past year she has noticed mild increased dyspnea on exertion with vigorous activities. No orthopnea, PND, pedal edema, chest pain or syncope. Admitted August 17 with complaints of sudden dyspnea and orthopnea requiring BiPAP. Initial blood pressure 187/92. Patient treated with blood pressure medications and diuretics with improvement in symptoms. Enzymes negative. Echocardiogram showed normal LV function, moderate left ventricular hypertrophy, grade 1 diastolic dysfunction. Cardiology asked to evaluate.  Medications Prior to Admission  Medication Sig Dispense Refill  . clonazePAM (KLONOPIN) 1 MG tablet Take 1 mg by mouth at bedtime.       Marland Kitchen MAGNESIUM PO Take 1 tablet by mouth daily.      . Multiple Vitamin (MULTIVITAMIN WITH MINERALS) TABS Take 1 tablet by mouth daily.      Marland Kitchen albuterol (PROVENTIL HFA;VENTOLIN HFA) 108 (90 BASE) MCG/ACT inhaler Inhale 2 puffs into the lungs every 6 (six) hours as needed. For wheeze or shortness of breath        No Known Allergies  Past Medical History  Diagnosis Date  . Epilepsy   . Bipolar 1 disorder   . Asthma   . Sarcoidosis   . Hypertension     Past Surgical History  Procedure Laterality Date  . Tonsillectomy    . Tubal ligation      History   Social History  . Marital Status: Married    Spouse Name: N/A    Number of Children: N/A  . Years of Education: N/A   Occupational History  . Not on file.   Social History Main Topics  . Smoking status: Never Smoker   . Smokeless tobacco: Not on file  . Alcohol Use: No  . Drug Use: No  . Sexual Activity:    Other Topics Concern  . Not on file   Social History Narrative  . No narrative on file    Family History  Problem Relation Age of Onset  . Pancreatic cancer Mother   . Hypertension Mother   . Diabetes Mother   .  Heart disease Father   . Gout Father   . Cirrhosis Father   . Heart disease Sister     CHF    ROS:  no fevers or chills, productive cough, hemoptysis, dysphasia, odynophagia, melena, hematochezia, dysuria, hematuria, rash, seizure activity, PND, pedal edema, claudication. Remaining systems are negative.  Physical Exam:   Blood pressure 116/68, pulse 71, temperature 98 F (36.7 C), temperature source Oral, resp. rate 16, height _0  (1.575 m), weight 166 lb 8 oz (75.524 kg), last menstrual period 07/12/2014, SpO2 96.00%.  General:  Well developed/well nourished in NAD Skin warm/dry Patient not depressed No peripheral clubbing Back-normal HEENT-normal/normal eyelids Neck supple/normal carotid upstroke bilaterally; no bruits; no JVD; no thyromegaly chest - CTA/ normal expansion CV - RRR/normal S1 and S2; no murmurs, rubs or gallops;  PMI nondisplaced Abdomen -NT/ND, no HSM, no mass, + bowel sounds, no bruit 2+ femoral pulses, no bruits Ext-no edema, chords, 2+ DP Neuro-grossly nonfocal  ECG Sinus rhythm, left ventricular hypertrophy.  Results for orders placed during the hospital encounter of 07/26/14 (from the past 48 hour(s))  CBC     Status: None   Collection Time    07/26/14  3:06 AM      Result Value Ref Range   WBC 8.4  4.0 - 10.5 K/uL   RBC 4.35  3.87 - 5.11 MIL/uL   Hemoglobin 12.6  12.0 - 15.0 g/dL   HCT 39.7  36.0 - 46.0 %   MCV 91.3  78.0 - 100.0 fL   MCH 29.0  26.0 - 34.0 pg   MCHC 31.7  30.0 - 36.0 g/dL   RDW 14.9  11.5 - 15.5 %   Platelets 235  150 - 400 K/uL  BASIC METABOLIC PANEL     Status: Abnormal   Collection Time    07/26/14  3:06 AM      Result Value Ref Range   Sodium 139  137 - 147 mEq/L   Potassium 3.6 (*) 3.7 - 5.3 mEq/L   Chloride 104  96 - 112 mEq/L   CO2 23  19 - 32 mEq/L   Glucose, Bld 124 (*) 70 - 99 mg/dL   BUN 13  6 - 23 mg/dL   Creatinine, Ser 0.64  0.50 - 1.10 mg/dL   Calcium 8.7  8.4 - 10.5 mg/dL   GFR calc non Af Amer >90  >90  mL/min   GFR calc Af Amer >90  >90 mL/min   Comment: (NOTE)     The eGFR has been calculated using the CKD EPI equation.     This calculation has not been validated in all clinical situations.     eGFR's persistently <90 mL/min signify possible Chronic Kidney     Disease.   Anion gap 12  5 - 15  PRO B NATRIURETIC PEPTIDE     Status: Abnormal   Collection Time    07/26/14  3:15 AM      Result Value Ref Range   Pro B Natriuretic peptide (BNP) 582.7 (*) 0 - 125 pg/mL  I-STAT TROPOININ, ED     Status: None   Collection Time    07/26/14  3:34 AM      Result Value Ref Range   Troponin i, poc 0.03  0.00 - 0.08 ng/mL   Comment 3            Comment: Due to the release kinetics of cTnI,     a negative result within the first hours     of the onset of symptoms does not rule out     myocardial infarction with certainty.     If myocardial infarction is still suspected,     repeat the test at appropriate intervals.  SEDIMENTATION RATE     Status: None   Collection Time    07/26/14  5:10 AM      Result Value Ref Range   Sed Rate 8  0 - 22 mm/hr  CULTURE, BLOOD (ROUTINE X 2)     Status: None   Collection Time    07/26/14  5:10 AM      Result Value Ref Range   Specimen Description BLOOD LEFT HAND     Special Requests BOTTLES DRAWN AEROBIC AND ANAEROBIC 5MLS     Culture  Setup Time       Value: 07/26/2014 08:51     Performed at [facility]   Culture       Value:        BLOOD CULTURE RECEIVED NO GROWTH TO DATE CULTURE WILL BE HELD FOR 5 DAYS BEFORE ISSUING A FINAL NEGATIVE REPORT     Performed at [facility]   Report Status PENDING    CULTURE, BLOOD (ROUTINE X 2)     Status: None   Collection Time    07/26/14  5:20 AM      Result Value Ref Range   Specimen Description BLOOD RIGHT HAND     Special Requests BOTTLES DRAWN AEROBIC AND ANAEROBIC 5MLS     Culture  Setup Time       Value: 07/26/2014 08:51     Performed at [facility]   Culture       Value:         BLOOD CULTURE RECEIVED NO GROWTH TO DATE CULTURE WILL BE HELD FOR 5 DAYS BEFORE ISSUING A FINAL NEGATIVE REPORT     Performed at [facility]   Report Status PENDING    TROPONIN I     Status: None   Collection Time    07/26/14  6:10 AM      Result Value Ref Range   Troponin I <0.30  <0.30 ng/mL   Comment:            Due to the release kinetics of cTnI,     a negative result within the first hours     of the onset of symptoms does not rule out     myocardial infarction with certainty.     If myocardial infarction is still suspected,     repeat the test at appropriate intervals.  MRSA PCR SCREENING     Status: None   Collection Time    07/26/14  6:34 AM      Result Value Ref Range   MRSA by PCR NEGATIVE  NEGATIVE   Comment:            The GeneXpert MRSA Assay (FDA     approved for NASAL specimens     only), is one component of a     comprehensive MRSA colonization     surveillance program. It is not     intended to diagnose MRSA     infection nor to guide or     monitor treatment for     MRSA infections.  TROPONIN I     Status: None   Collection Time    07/26/14 10:50 AM      Result Value Ref Range   Troponin I <0.30  <0.30 ng/mL   Comment:            Due to the release kinetics of cTnI,     a negative result within the first hours     of the onset of symptoms does not rule out     myocardial infarction with certainty.     If myocardial infarction is still suspected,     repeat the test at appropriate intervals.  TROPONIN I     Status: None   Collection Time    07/26/14  6:13 PM      Result Value Ref Range   Troponin I <0.30  <0.30 ng/mL   Comment:            Due to the release kinetics of cTnI,     a negative result within the first hours     of the onset of symptoms does not rule out     myocardial infarction with certainty.     If myocardial infarction is still suspected,     repeat the test at appropriate intervals.  BASIC METABOLIC PANEL     Status:  Abnormal   Collection Time    07/27/14  5:35 AM      Result Value Ref Range   Sodium 139  137 - 147 mEq/L  Potassium 3.5 (*) 3.7 - 5.3 mEq/L   Chloride 98  96 - 112 mEq/L   CO2 27  19 - 32 mEq/L   Glucose, Bld 98  70 - 99 mg/dL   BUN 18  6 - 23 mg/dL   Creatinine, Ser 1.41 (*) 0.50 - 1.10 mg/dL   Comment: DELTA CHECK NOTED   Calcium 9.0  8.4 - 10.5 mg/dL   GFR calc non Af Amer 42 (*) >90 mL/min   GFR calc Af Amer 48 (*) >90 mL/min   Comment: (NOTE)     The eGFR has been calculated using the CKD EPI equation.     This calculation has not been validated in all clinical situations.     eGFR's persistently <90 mL/min signify possible Chronic Kidney     Disease.   Anion gap 14  5 - 15  TSH     Status: None   Collection Time    07/27/14  5:35 AM      Result Value Ref Range   TSH 1.750  0.350 - 4.500 uIU/mL    Ct Head Wo Contrast  07/27/2014   CLINICAL DATA:  Headache.  EXAM: CT HEAD WITHOUT CONTRAST  TECHNIQUE: Contiguous axial images were obtained from the base of the skull through the vertex without intravenous contrast.  COMPARISON:  CT scan of December 10, 2003.  FINDINGS: Bony calvarium appears intact. No mass effect or midline shift is noted. Ventricular size is within normal limits. There is no evidence of mass lesion, hemorrhage or acute infarction.  IMPRESSION: Normal head CT.   Electronically Signed   By: Sabino Dick M.D.   On: 07/27/2014 09:58   Dg Chest Port 1 View  07/26/2014   CLINICAL DATA:  Sudden onset chest pain.  EXAM: PORTABLE CHEST - 1 VIEW  COMPARISON:  12/27/2012  FINDINGS: Diffuse airspace disease which is symmetric. No effusion or cardiomegaly. Negative aortic contours. No pneumothorax.  IMPRESSION: Diffuse airspace disease with symmetry favoring pneumonitis/noncardiogenic edema over pneumonia.   Electronically Signed   By: Jorje Guild M.D.   On: 07/26/2014 03:55    Assessment/Plan 1 acute diastolic congestive heart failure-patient presented with  dyspnea/orthopnea and chest x-ray with pulmonary edema. Episode felt hypertensive mediated. She has improved with diuresis and control of her blood pressure. Echocardiogram shows preserved LV function, left ventricular hypertrophy and grade 1 diastolic dysfunction. Creatinine has increased with medications. Continue carvedilol and lisinopril. If renal function deteriorates further will discontinue lisinopril. I will change Lasix to 20 mg by mouth daily. Further titration of medications based on followup blood pressure. Note enzymes are negative. We will arrange an outpatient nuclear study to screen for ischemia given the sudden onset of symptoms. Repeat chest x-ray 2 hypertension-blood pressure is improving with present medications. Follow blood pressure and adjust as needed. 3 history of sarcoid 4 acute kidney failure-creatinine has increased. Monitor closely. May need to discontinue ACE inhibitor pending followup results.   Kirk Ruths MD 07/27/2014, 10:50 AM

## 2014-07-27 NOTE — Plan of Care (Signed)
Problem: Phase I Progression Outcomes Goal: Other Phase I Outcomes/Goals Outcome: Completed/Met Date Met:  07/27/14 Heart Failure packet given

## 2014-07-27 NOTE — Progress Notes (Signed)
Pt request urinary sample be sent for UTI - pt state cramping and nausea similar to when pt has UTIs. No burning on urination. On call physician notified.

## 2014-07-27 NOTE — Progress Notes (Signed)
PATIENT DETAILS Name: Mikayla Sweeney Age: 53 y.o. Sex: female Date of Birth: May 12, 1961 Admit Date: 07/26/2014 Admitting Physician Ron Parker, MD ZOX:[id number],WJXBJ, MD  Brief narrative:  53 y.o. female with a history of HTN, Sarcoid, and Bipolar disorder who presented to the ED with complaints of sudden onset of SOB and orthopnea. She was found to have pulmonary edema and acute hypoxic resp failure, initially requiring BiPAP and admission to the ICU. Suspicions for Flash Pulmonary Edema from uncontrolled HTN.  Subjective: Much better, claims to still be congested  Assessment/Plan: Active Problems:   Acute respiratory failure with hypoxia -resolved -secondary to flash pulmonary edema,required BiPAP on admission   Acute Pulmonary edema/Acute Diastolic Heart Failure -Suspect uncontrolled HTN as etiology -given IV Lasix, change to daily today-suspect can be changed to oral from tomorrow -Echo shows preserved EF with grade 1 diastolic dysfunction -will ask Cards to see prior to discharge  HTNsive Emergency -had acute/flash pulmonary edema on admit-requiring IV NTG, and BiPAP support -BP currently well controlled with Coreg, Lisinopril and Lasix  ARF -decrease Lasix-recheck lytes in am  HTN -apparently was just diagnosed with HTN a month back -further work up for secondary causes-deferred to outpatient setting   Headache -persistent over the past few days, initially attributed to NTG-however still ongoing. CT head neg. Has hx of Migraine headache-will try one dose of Toradol/Reglan to see if helps. Will reassess later.    Bipolar 1 disorder -stable-continue with home meds    Sarcoidosis  Disposition: Remain inpatient  DVT Prophylaxis: Prophylactic Lovenox   Code Status: Full code   Family Communication Husband at bedside  Procedures:  None  CONSULTS:  cardiology  MEDICATIONS: Scheduled Meds: . aspirin EC  325 mg Oral Daily  . carvedilol   6.25 mg Oral BID WC  . clonazePAM  1 mg Oral QHS  . enoxaparin (LOVENOX) injection  40 mg Subcutaneous Q24H  . furosemide  40 mg Intravenous Daily  . lisinopril  10 mg Oral Daily  . metoCLOPramide  10 mg Oral Once  . multivitamin with minerals  1 tablet Oral Daily  . potassium chloride  20 mEq Oral BID   Continuous Infusions:  PRN Meds:.acetaminophen, ketorolac, ondansetron (ZOFRAN) IV, promethazine, traMADol  Antibiotics: Anti-infectives   None       PHYSICAL EXAM: Vital signs in last 24 hours: Filed Vitals:   07/26/14 1029 07/26/14 2202 07/27/14 0527 07/27/14 0810  BP: 146/80 136/83 123/75 114/66  Pulse: 65 82 71 71  Temp: 98.2 F (36.8 C) 98.7 F (37.1 C) 98 F (36.7 C)   TempSrc: Oral Oral Oral   Resp: 20 16 16    Height:      Weight:   75.524 kg (166 lb 8 oz)   SpO2: 99% 96% 96%     Weight change: -0.227 kg (-8 oz) Filed Weights   07/26/14 0301 07/26/14 0500 07/27/14 0527  Weight: 75.751 kg (167 lb) 75.5 kg (166 lb 7.2 oz) 75.524 kg (166 lb 8 oz)   Body mass index is 30.45 kg/(m^2).   Gen Exam: Awake and alert with clear speech.   Neck: Supple, No JVD.   Chest: few bibasilar rales CVS: S1 S2 Regular, no murmurs.  Abdomen: soft, BS +, non tender, non distended.  Extremities: no edema, lower extremities warm to touch. Neurologic: Non Focal.   Skin: No Rash.   Wounds: N/A.    Intake/Output from previous day:  Intake/Output Summary (Last 24 hours) at  07/27/14 1015 Last data filed at 07/26/14 1700  Gross per 24 hour  Intake    480 ml  Output      0 ml  Net    480 ml     LAB RESULTS: CBC  Recent Labs Lab 07/26/14 0306  WBC 8.4  HGB 12.6  HCT 39.7  PLT 235  MCV 91.3  MCH 29.0  MCHC 31.7  RDW 14.9    Chemistries   Recent Labs Lab 07/26/14 0306 07/27/14 0535  NA 139 139  K 3.6* 3.5*  CL 104 98  CO2 23 27  GLUCOSE 124* 98  BUN 13 18  CREATININE 0.64 1.41*  CALCIUM 8.7 9.0    CBG: No results found for this basename: GLUCAP,   in the last 168 hours  GFR Estimated Creatinine Clearance: 43.9 ml/min (by C-G formula based on Cr of 1.41).  Coagulation profile No results found for this basename: INR, PROTIME,  in the last 168 hours  Cardiac Enzymes  Recent Labs Lab 07/26/14 0610 07/26/14 1050 07/26/14 1813  TROPONINI <0.30 <0.30 <0.30    No components found with this basename: POCBNP,  No results found for this basename: DDIMER,  in the last 72 hours No results found for this basename: HGBA1C,  in the last 72 hours No results found for this basename: CHOL, HDL, LDLCALC, TRIG, CHOLHDL, LDLDIRECT,  in the last 72 hours  Recent Labs  07/27/14 0535  TSH 1.750   No results found for this basename: VITAMINB12, FOLATE, FERRITIN, TIBC, IRON, RETICCTPCT,  in the last 72 hours No results found for this basename: LIPASE, AMYLASE,  in the last 72 hours  Urine Studies No results found for this basename: UACOL, UAPR, USPG, UPH, UTP, UGL, UKET, UBIL, UHGB, UNIT, UROB, ULEU, UEPI, UWBC, URBC, UBAC, CAST, CRYS, UCOM, BILUA,  in the last 72 hours  MICROBIOLOGY: Recent Results (from the past 240 hour(s))  CULTURE, BLOOD (ROUTINE X 2)     Status: None   Collection Time    07/26/14  5:10 AM      Result Value Ref Range Status   Specimen Description BLOOD LEFT HAND   Final   Special Requests BOTTLES DRAWN AEROBIC AND ANAEROBIC   Final   Culture  Setup Time     Final   Value: 07/26/2014 08:51     Performed at [facility]   Culture     Final   Value:        BLOOD CULTURE RECEIVED NO GROWTH TO DATE CULTURE WILL BE HELD FOR 5 DAYS BEFORE ISSUING A FINAL NEGATIVE REPORT     Performed at [facility]   Report Status PENDING   Incomplete  CULTURE, BLOOD (ROUTINE X 2)     Status: None   Collection Time    07/26/14  5:20 AM      Result Value Ref Range Status   Specimen Description BLOOD RIGHT HAND   Final   Special Requests BOTTLES DRAWN AEROBIC AND ANAEROBIC   Final   Culture  Setup Time      Final   Value: 07/26/2014 08:51     Performed at [facility]   Culture     Final   Value:        BLOOD CULTURE RECEIVED NO GROWTH TO DATE CULTURE WILL BE HELD FOR 5 DAYS BEFORE ISSUING A FINAL NEGATIVE REPORT     Performed at [facility]   Report Status PENDING  Incomplete  MRSA PCR SCREENING     Status: None   Collection Time    07/26/14  6:34 AM      Result Value Ref Range Status   MRSA by PCR NEGATIVE  NEGATIVE Final   Comment:            The GeneXpert MRSA Assay (FDA     approved for NASAL specimens     only), is one component of a     comprehensive MRSA colonization     surveillance program. It is not     intended to diagnose MRSA     infection nor to guide or     monitor treatment for     MRSA infections.    RADIOLOGY STUDIES/RESULTS: Ct Head Wo Contrast  07/27/2014   CLINICAL DATA:  Headache.  EXAM: CT HEAD WITHOUT CONTRAST  TECHNIQUE: Contiguous axial images were obtained from the base of the skull through the vertex without intravenous contrast.  COMPARISON:  CT scan of December 10, 2003.  FINDINGS: Bony calvarium appears intact. No mass effect or midline shift is noted. Ventricular size is within normal limits. There is no evidence of mass lesion, hemorrhage or acute infarction.  IMPRESSION: Normal head CT.   Electronically Signed   By: Roque LiasJames  Green M.D.   On: 07/27/2014 09:58   Dg Chest Port 1 View  07/26/2014   CLINICAL DATA:  Sudden onset chest pain.  EXAM: PORTABLE CHEST - 1 VIEW  COMPARISON:  12/27/2012  FINDINGS: Diffuse airspace disease which is symmetric. No effusion or cardiomegaly. Negative aortic contours. No pneumothorax.  IMPRESSION: Diffuse airspace disease with symmetry favoring pneumonitis/noncardiogenic edema over pneumonia.   Electronically Signed   By: Tiburcio PeaJonathan  Watts M.D.   On: 07/26/2014 03:55    Jeoffrey MassedGHIMIRE,Jya Hughston, MD  [facility] Pager:[phone]  If 7PM-7AM, please contact night-coverage [email or web address] Password  [email or web address] 07/27/2014, 10:15 AM   LOS: 1 day   **Disclaimer: This note may have been dictated with voice recognition software. Similar sounding words can inadvertently be transcribed and this note may contain transcription errors which may not have been corrected upon publication of note.**

## 2014-07-28 ENCOUNTER — Other Ambulatory Visit: Payer: Self-pay | Admitting: Physician Assistant

## 2014-07-28 DIAGNOSIS — N390 Urinary tract infection, site not specified: Secondary | ICD-10-CM

## 2014-07-28 DIAGNOSIS — I5031 Acute diastolic (congestive) heart failure: Secondary | ICD-10-CM

## 2014-07-28 DIAGNOSIS — I5021 Acute systolic (congestive) heart failure: Secondary | ICD-10-CM

## 2014-07-28 DIAGNOSIS — I10 Essential (primary) hypertension: Secondary | ICD-10-CM

## 2014-07-28 LAB — BASIC METABOLIC PANEL
Anion gap: 13 (ref 5–15)
BUN: 30 mg/dL — AB (ref 6–23)
CO2: 24 mEq/L (ref 19–32)
CREATININE: 1.29 mg/dL — AB (ref 0.50–1.10)
Calcium: 8.4 mg/dL (ref 8.4–10.5)
Chloride: 101 mEq/L (ref 96–112)
GFR calc Af Amer: 54 mL/min — ABNORMAL LOW (ref >90)
GFR, EST NON AFRICAN AMERICAN: 46 mL/min — AB (ref >90)
Glucose, Bld: 111 mg/dL — ABNORMAL HIGH (ref 70–99)
Potassium: 4.5 mEq/L (ref 3.7–5.3)
Sodium: 138 mEq/L (ref 137–147)

## 2014-07-28 MED ORDER — TRAMADOL HCL 50 MG PO TABS: 50.0000 mg | ORAL_TABLET | Freq: Four times a day (QID) | ORAL | Status: DC | PRN

## 2014-07-28 MED ORDER — CARVEDILOL 6.25 MG PO TABS: 6.2500 mg | ORAL_TABLET | Freq: Two times a day (BID) | ORAL | Status: DC

## 2014-07-28 MED ORDER — DEXTROSE 5 % IV SOLN
1.0000 g | INTRAVENOUS | Status: DC
  Administered 2014-07-28: 1 g via INTRAVENOUS
  Filled 2014-07-28: qty 10

## 2014-07-28 MED ORDER — FUROSEMIDE 20 MG PO TABS: 20.0000 mg | ORAL_TABLET | Freq: Every day | ORAL | Status: DC

## 2014-07-28 MED ORDER — LISINOPRIL 10 MG PO TABS
10.0000 mg | ORAL_TABLET | Freq: Every day | ORAL | Status: DC
Start: 2014-07-28 — End: 2014-09-02

## 2014-07-28 MED ORDER — CEFUROXIME AXETIL 500 MG PO TABS: 500.0000 mg | ORAL_TABLET | Freq: Two times a day (BID) | ORAL | Status: DC

## 2014-07-28 MED ORDER — BISACODYL 5 MG PO TBEC
10.0000 mg | DELAYED_RELEASE_TABLET | Freq: Once | ORAL | Status: AC
  Administered 2014-07-28: 10 mg via ORAL
  Filled 2014-07-28: qty 2

## 2014-07-28 MED ORDER — ASPIRIN 325 MG PO TBEC: 325.0000 mg | DELAYED_RELEASE_TABLET | Freq: Every day | ORAL | Status: DC

## 2014-07-28 NOTE — Progress Notes (Signed)
Subjective: Feels better.  Slept well. No SOB  Objective: Vital signs in last 24 hours: Temp:  [98.2 F (36.8 C)-98.6 F (37 C)] 98.2 F (36.8 C) (08/19 0701) Pulse Rate:  [63-71] 63 (08/19 0701) Resp:  [16-18] 16 (08/19 0701) BP: (103-124)/(63-78) 124/78 mmHg (08/19 0701) SpO2:  [95 %-96 %] 95 % (08/19 0701) Last BM Date: 07/26/14  Intake/Output from previous day: 08/18 0701 - 08/19 0700 In: 1080 [P.O.:1080] Out: 700 [Urine:700] Intake/Output this shift:    Medications Current Facility-Administered Medications  Medication Dose Route Frequency Provider Last Rate Last Dose  . acetaminophen (TYLENOL) tablet 650 mg  650 mg Oral Q4H PRN Ron ParkerHarvette C Jenkins, MD   650 mg at 07/26/14 0719  . aspirin EC tablet 325 mg  325 mg Oral Daily Ron ParkerHarvette C Jenkins, MD   325 mg at 07/27/14 1049  . carvedilol (COREG) tablet 6.25 mg  6.25 mg Oral BID WC Lonia BloodJeffrey T McClung, MD   6.25 mg at 07/27/14 1617  . clonazePAM (KLONOPIN) tablet 1 mg  1 mg Oral QHS Lonia BloodJeffrey T McClung, MD   1 mg at 07/27/14 2252  . enoxaparin (LOVENOX) injection 40 mg  40 mg Subcutaneous Q24H Ron ParkerHarvette C Jenkins, MD   40 mg at 07/27/14 1048  . furosemide (LASIX) tablet 20 mg  20 mg Oral Daily Shanker Levora DredgeM Ghimire, MD      . ketorolac (TORADOL) 30 MG/ML injection 30 mg  30 mg Intravenous Q6H PRN Russella DarAllison L Ellis, NP   30 mg at 07/27/14 2257  . lisinopril (PRINIVIL,ZESTRIL) tablet 10 mg  10 mg Oral Daily Ron ParkerHarvette C Jenkins, MD   10 mg at 07/27/14 1048  . multivitamin with minerals tablet 1 tablet  1 tablet Oral Daily Lonia BloodJeffrey T McClung, MD   1 tablet at 07/27/14 1048  . ondansetron (ZOFRAN) injection 4 mg  4 mg Intravenous Q4H PRN Russella DarAllison L Ellis, NP      . promethazine (PHENERGAN) injection 25 mg  25 mg Intravenous Q6H PRN Russella DarAllison L Ellis, NP   25 mg at 07/27/14 0816  . traMADol (ULTRAM) tablet 50 mg  50 mg Oral Q6H PRN Lonia BloodJeffrey T McClung, MD   50 mg at 07/27/14 [id number]    PE: General appearance: alert, cooperative and no  distress Lungs: clear to auscultation bilaterally Heart: regular rate and rhythm, S1, S2 normal, no murmur, click, rub or gallop Extremities: No LEE Pulses: 2+ and symmetric Skin: Warm and dry. Neurologic: Grossly normal  Lab Results:   Recent Labs  07/26/14 0306  WBC 8.4  HGB 12.6  HCT 39.7  PLT 235   BMET  Recent Labs  07/26/14 0306 07/27/14 0535 07/28/14 0500  NA 139 139 138  K 3.6* 3.5* 4.5  CL 104 98 101  CO2 23 27 24   GLUCOSE 124* 98 111*  BUN 13 18 30*  CREATININE 0.64 1.41* 1.29*  CALCIUM 8.7 9.0 8.4   Echo,  Study Conclusions  - Left ventricle: The cavity size was normal. There was moderate concentric hypertrophy. Systolic function was normal. The estimated ejection fraction was in the range of 55% to 60%. Wall motion was normal; there were no regional wall motion abnormalities. Doppler parameters are consistent with abnormal left ventricular relaxation (grade 1 diastolic dysfunction). The E/e&' ratio is between 8-15, suggesting indeterminate LV filling pressure. - Left atrium: The atrium was normal in size.  Impressions:  - LVEF 55-60%, moderate concentric LVH, diastolic dysfunction, indeterminate LV filling pressure. Compared to the prior echo  in 2004, there is now moderate concentric LVH with diastolic dysfunction.   Assessment/Plan   Active Problems:   Acute respiratory failure with hypoxia   Pulmonary edema   Benign essential HTN   Sarcoidosis   Bipolar 1 disorder   Headache   Acute diastolic heart failure  Plan:    Net fluids: +0.4L/-0.3L.  On lasix 20mg  daily.   BP improved considerably.  Coreg and lisinopril.   Mildly improved SCr.  Order written for OP nuclear stress test.  The office will contact patient for scheduling.  We discussed daily weight monitoring and low sodium diet as well as other healthy eating habits.      LOS: 2 days    HAGER, BRYAN PA-C 07/28/2014 9:40 AM  Personally seen and examined. Agree with above. OK  with discharge.   Donato Schultz, MD

## 2014-07-28 NOTE — Progress Notes (Signed)
NURSING PROGRESS NOTE  Mikayla PollenRobin B Sweeney [id number] Discharge Data: 07/28/2014 3:40 PM Attending Provider: Kela MillinAdeline C Viyuoh, MD WGN:[id number],HQIONPCP:MACKENZIE,BRIAN, MD     [name] to be D/C'd Home per MD order.  Discussed with the patient the After Visit Summary and all questions fully answered. All IV's discontinued with no bleeding noted. All belongings returned to patient for patient to take home.   Last Vital Signs:  Blood pressure 136/65, pulse 74, temperature 98.3 F (36.8 C), temperature source Oral, resp. rate 16, height 5\' 2"  (1.575 m), weight 75.297 kg (166 lb), last menstrual period 07/12/2014, SpO2 99.00%.  Discharge Medication List   Medication List         albuterol 108 (90 BASE) MCG/ACT inhaler  Commonly known as:  PROVENTIL HFA;VENTOLIN HFA  Inhale 2 puffs into the lungs every 6 (six) hours as needed. For wheeze or shortness of breath     aspirin 325 MG EC tablet  Take 1 tablet (325 mg total) by mouth daily.     carvedilol 6.25 MG tablet  Commonly known as:  COREG  Take 1 tablet (6.25 mg total) by mouth 2 (two) times daily with a meal.     cefUROXime 500 MG tablet  Commonly known as:  CEFTIN  Take 1 tablet (500 mg total) by mouth 2 (two) times daily with a meal.     clonazePAM 1 MG tablet  Commonly known as:  KLONOPIN  Take 1 mg by mouth at bedtime.     furosemide 20 MG tablet  Commonly known as:  LASIX  Take 1 tablet (20 mg total) by mouth daily.     lisinopril 10 MG tablet  Commonly known as:  PRINIVIL,ZESTRIL  Take 1 tablet (10 mg total) by mouth daily.     MAGNESIUM PO  Take 1 tablet by mouth daily.     multivitamin with minerals Tabs tablet  Take 1 tablet by mouth daily.     traMADol 50 MG tablet  Commonly known as:  ULTRAM  Take 1 tablet (50 mg total) by mouth every 6 (six) hours as needed for severe pain.         Cathlyn Parsonsattha Lateisha Thurlow, RN

## 2014-07-28 NOTE — Discharge Summary (Signed)
Physician Discharge Summary  Mikayla Sweeney FAO:[id number] DOB: [birth year] DOA: 07/26/2014  PCP: Thayer HeadingsMACKENZIE,BRIAN, MD  Admit date: 07/26/2014 Discharge date: 07/28/2014  Time spent:>4630minutes  Recommendations for Outpatient Follow-up:  Follow-up Information   Follow up with Thayer HeadingsMACKENZIE,BRIAN, MD. (in 1week, call for appt)    Specialty:  Internal Medicine   Contact information:   [address], [address] [place] [address] [phone]       Please follow up. (Cardiology Outpt for Stress Test as Scheduled)        Discharge Diagnoses:  Active Problems:   Acute respiratory failure with hypoxia   Pulmonary edema   Benign essential HTN   Sarcoidosis   Bipolar 1 disorder   Headache   Acute diastolic heart failure   Discharge Condition: Improved/stable  Diet recommendation: Low sodium heart healthy  Filed Weights   07/26/14 0500 07/27/14 0527 07/28/14 1300  Weight: 75.5 kg (166 lb 7.2 oz) 75.524 kg (166 lb 8 oz) 75.297 kg (166 lb)    History of present illness:  The patient is a 53 y.o. female with a history of HTN, Sarcoid, and Bipolar disorder who presents to the ED with complaints of sudden onset of SOB and orthopnea. She denies chest pain, and was evaluated in the ED and found to have Acute Respiratory Failure with hypoxia and O2 Sats in the 80's and was placed on BIPAP. An X-Ray revealed acute Pulmonary Edema. She was admitted for further evaluation and management.   Hospital Course:  Acute respiratory failure with hypoxia  -As discussed above upon admission chest x-ray revealed diffuse airspace disease/flash infiltrates -The impression was that above was flash pulmonary edema secondary to malignant hypertension -She was placed BiPAP on admission and diuresed with Lasix -Her hypertension was treated as discussed below -Her respiratory failure/hypoxia resolved and she's been oxygenating well on room air. Acute Pulmonary edema/Acute Diastolic Heart Failure   -Suspect uncontrolled HTN as etiology  -She was diuresed with IV Lasix, cardiology was consulted to follow>> patient improved the Lasix was changed to by mouth -Echo shows preserved EF with grade 1 diastolic dysfunction  -Per cardiology and outpatient stress test has been ordered and she is to follow up as directed per cards. Malignant hypertension/hypertensive emergency  -had acute/flash pulmonary edema on admit-requiring IV NTG, and BiPAP support  -BP currently well controlled with Coreg, Lisinopril and Lasix>> she is to continue this upon discharge and followup with PCP and  cardiology -further work up for secondary causes-deferred to outpatient setting  ARF  -Renal function peaked at 1.41 with diuresis and ACE inhibitor use. The Lasix dose was decreased on followup today her creatinine  has improved to 1.29. She is to follow up outpatient with PCP Headache  -persistent over the past few days, initially attributed to NTG-however still ongoing. CT head neg. Has hx of Migraine headache-will try one dose of Toradol/Reglan to see if helps. Will reassess later.  Bipolar 1 disorder  -stable-continue with home meds  Sarcoidosis -Follow up outpatient   Procedures:  2-D echo Impressions:  - LVEF 55-60%, moderate concentric LVH, diastolic dysfunction, indeterminate LV filling pressure. Compared to the prior echo in 2004, there is now moderate concentric LVH with diastolic dysfunction.   Consultations:  Cardiology  Discharge Exam: Filed Vitals:   07/28/14 0944  BP: 127/82  Pulse:   Temp:   Resp:    Gen Exam: Awake and alert with clear speech.  Neck: Supple, No JVD.  Chest: Clear to auscultation bilaterally, no crackles or wheezes  CVS: S1 S2 Regular, no murmurs.  Abdomen: soft, BS +, non tender, non distended.  Extremities: no edema, lower extremities warm to touch.    Discharge Instructions You were cared for by a hospitalist during your hospital stay. If you have any  questions about your discharge medications or the care you received while you were in the hospital after you are discharged, you can call the unit and asked to speak with the hospitalist on call if the hospitalist that took care of you is not available. Once you are discharged, your primary care physician will handle any further medical issues. Please note that NO REFILLS for any discharge medications will be authorized once you are discharged, as it is imperative that you return to your primary care physician (or establish a relationship with a primary care physician if you do not have one) for your aftercare needs so that they can reassess your need for medications and monitor your lab values.  Discharge Instructions   Diet - low sodium heart healthy    Complete by:  As directed      Increase activity slowly    Complete by:  As directed             Medication List         albuterol 108 (90 BASE) MCG/ACT inhaler  Commonly known as:  PROVENTIL HFA;VENTOLIN HFA  Inhale 2 puffs into the lungs every 6 (six) hours as needed. For wheeze or shortness of breath     aspirin 325 MG EC tablet  Take 1 tablet (325 mg total) by mouth daily.     carvedilol 6.25 MG tablet  Commonly known as:  COREG  Take 1 tablet (6.25 mg total) by mouth 2 (two) times daily with a meal.     clonazePAM 1 MG tablet  Commonly known as:  KLONOPIN  Take 1 mg by mouth at bedtime.     furosemide 20 MG tablet  Commonly known as:  LASIX  Take 1 tablet (20 mg total) by mouth daily.     lisinopril 10 MG tablet  Commonly known as:  PRINIVIL,ZESTRIL  Take 1 tablet (10 mg total) by mouth daily.     MAGNESIUM PO  Take 1 tablet by mouth daily.     multivitamin with minerals Tabs tablet  Take 1 tablet by mouth daily.     traMADol 50 MG tablet  Commonly known as:  ULTRAM  Take 1 tablet (50 mg total) by mouth every 6 (six) hours as needed for severe pain.       No Known Allergies    The results of significant  diagnostics from this hospitalization (including imaging, microbiology, ancillary and laboratory) are listed below for reference.    Significant Diagnostic Studies: Dg Chest 2 View  07/27/2014   CLINICAL DATA:  Dyspnea, CHF, history hypertension and sarcoidosis  EXAM: CHEST  2 VIEW  COMPARISON:  07/26/2014  FINDINGS: Upper normal heart size.  Slight pulmonary vascular congestion.  Mediastinal contours normal.  Improved pulmonary edema since previous exam.  No segmental consolidation, pleural effusion, or pneumothorax.  Bones unremarkable.  IMPRESSION: Improved pulmonary edema.   Electronically Signed   By: Ulyses Southward M.D.   On: 07/27/2014 12:08   Ct Head Wo Contrast  07/27/2014   CLINICAL DATA:  Headache.  EXAM: CT HEAD WITHOUT CONTRAST  TECHNIQUE: Contiguous axial images were obtained from the base of the skull through the vertex without intravenous contrast.  COMPARISON:  CT scan of December 10, 2003.  FINDINGS: Bony calvarium appears intact. No mass effect or midline shift is noted. Ventricular size is within normal limits. There is no evidence of mass lesion, hemorrhage or acute infarction.  IMPRESSION: Normal head CT.   Electronically Signed   By: Roque Lias M.D.   On: 07/27/2014 09:58   Dg Chest Port 1 View  07/26/2014   CLINICAL DATA:  Sudden onset chest pain.  EXAM: PORTABLE CHEST - 1 VIEW  COMPARISON:  12/27/2012  FINDINGS: Diffuse airspace disease which is symmetric. No effusion or cardiomegaly. Negative aortic contours. No pneumothorax.  IMPRESSION: Diffuse airspace disease with symmetry favoring pneumonitis/noncardiogenic edema over pneumonia.   Electronically Signed   By: Tiburcio Pea M.D.   On: 07/26/2014 03:55    Microbiology: Recent Results (from the past 240 hour(s))  CULTURE, BLOOD (ROUTINE X 2)     Status: None   Collection Time    07/26/14  5:10 AM      Result Value Ref Range Status   Specimen Description BLOOD LEFT HAND   Final   Special Requests BOTTLES DRAWN AEROBIC  AND ANAEROBIC   Final   Culture  Setup Time     Final   Value: 07/26/2014 08:51     Performed at [facility]   Culture     Final   Value:        BLOOD CULTURE RECEIVED NO GROWTH TO DATE CULTURE WILL BE HELD FOR 5 DAYS BEFORE ISSUING A FINAL NEGATIVE REPORT     Performed at [facility]   Report Status PENDING   Incomplete  CULTURE, BLOOD (ROUTINE X 2)     Status: None   Collection Time    07/26/14  5:20 AM      Result Value Ref Range Status   Specimen Description BLOOD RIGHT HAND   Final   Special Requests BOTTLES DRAWN AEROBIC AND ANAEROBIC   Final   Culture  Setup Time     Final   Value: 07/26/2014 08:51     Performed at [facility]   Culture     Final   Value:        BLOOD CULTURE RECEIVED NO GROWTH TO DATE CULTURE WILL BE HELD FOR 5 DAYS BEFORE ISSUING A FINAL NEGATIVE REPORT     Performed at [facility]   Report Status PENDING   Incomplete  MRSA PCR SCREENING     Status: None   Collection Time    07/26/14  6:34 AM      Result Value Ref Range Status   MRSA by PCR NEGATIVE  NEGATIVE Final   Comment:            The GeneXpert MRSA Assay (FDA     approved for NASAL specimens     only), is one component of a     comprehensive MRSA colonization     surveillance program. It is not     intended to diagnose MRSA     infection nor to guide or     monitor treatment for     MRSA infections.     Labs: Basic Metabolic Panel:  Recent Labs Lab 07/26/14 0306 07/27/14 0535 07/28/14 0500  NA 139 139 138  K 3.6* 3.5* 4.5  CL 104 98 101  CO2 23 27 24   GLUCOSE 124* 98 111*  BUN 13 18 30*  CREATININE 0.64 1.41* 1.29*  CALCIUM 8.7 9.0 8.4   Liver Function Tests: No results  found for this basename: AST, ALT, ALKPHOS, BILITOT, PROT, ALBUMIN,  in the last 168 hours No results found for this basename: LIPASE, AMYLASE,  in the last 168 hours No results found for this basename: AMMONIA,  in the last 168 hours CBC:  Recent Labs Lab  07/26/14 0306  WBC 8.4  HGB 12.6  HCT 39.7  MCV 91.3  PLT 235   Cardiac Enzymes:  Recent Labs Lab 07/26/14 0610 07/26/14 1050 07/26/14 1813  TROPONINI <0.30 <0.30 <0.30   BNP: BNP (last 3 results)  Recent Labs  07/26/14 0315  PROBNP 582.7*   CBG: No results found for this basename: GLUCAP,  in the last 168 hours     Signed:  Kela Millin  [facility] 07/28/2014, 2:29 PM

## 2014-07-30 LAB — URINE CULTURE

## 2014-08-01 LAB — CULTURE, BLOOD (ROUTINE X 2)
Culture: NO GROWTH
Culture: NO GROWTH

## 2014-08-24 ENCOUNTER — Encounter (HOSPITAL_COMMUNITY): Payer: 59

## 2014-08-24 ENCOUNTER — Encounter (HOSPITAL_COMMUNITY): Payer: Self-pay

## 2014-08-24 NOTE — Progress Notes (Signed)
Patient ID: Mikayla Sweeney, female   DOB: Nov 30, 1961, 53 y.o.   MRN: [id number] Elaysha Bevard was a no show today for a [facility]. Message was left on home numbers [phone] and [phone] for the patient to call us back to reschedule. Dr. Donato Schultz and his nurse, Avie Arenas notified of the no show. Irean Hong, RN.

## 2014-09-02 ENCOUNTER — Other Ambulatory Visit: Payer: Self-pay | Admitting: *Deleted

## 2014-09-02 MED ORDER — FUROSEMIDE 20 MG PO TABS: 20.0000 mg | ORAL_TABLET | Freq: Every day | ORAL | Status: DC

## 2014-09-02 MED ORDER — ASPIRIN 325 MG PO TBEC: 325.0000 mg | DELAYED_RELEASE_TABLET | Freq: Every day | ORAL | Status: DC

## 2014-09-02 MED ORDER — LISINOPRIL 10 MG PO TABS: 10.0000 mg | ORAL_TABLET | Freq: Every day | ORAL | Status: DC

## 2014-09-06 ENCOUNTER — Institutional Professional Consult (permissible substitution): Payer: 59 | Admitting: Cardiology

## 2014-09-08 ENCOUNTER — Encounter (HOSPITAL_COMMUNITY): Payer: 59

## 2014-09-08 ENCOUNTER — Encounter (HOSPITAL_COMMUNITY): Payer: Self-pay

## 2014-09-08 NOTE — Progress Notes (Signed)
Patient ID: Mikayla Sweeney, female   DOB: 10/18/1961, 53 y.o.   MRN: [id number] Mikayla Sweeney, DOB, 12-12-1960, was a [facility] show [facility]. A message was left on the patients' home numbers at [phone], and [phone] to call us back to reschedule. Notified Dr. Donato SchultzMark Sweeney and his nurse, Mikayla ArenasPam Fleming, RN of the no show. Mikayla HongPatsy Belky Mundo, RN.

## 2014-09-22 ENCOUNTER — Telehealth: Payer: Self-pay | Admitting: Cardiology

## 2014-09-22 NOTE — Telephone Encounter (Signed)
Close encounter 

## 2014-09-24 ENCOUNTER — Other Ambulatory Visit: Payer: Self-pay

## 2014-09-27 ENCOUNTER — Ambulatory Visit: Payer: 59 | Admitting: Cardiology

## 2014-10-10 ENCOUNTER — Emergency Department (HOSPITAL_BASED_OUTPATIENT_CLINIC_OR_DEPARTMENT_OTHER): Payer: 59

## 2014-10-10 ENCOUNTER — Encounter (HOSPITAL_BASED_OUTPATIENT_CLINIC_OR_DEPARTMENT_OTHER): Payer: Self-pay | Admitting: Emergency Medicine

## 2014-10-10 ENCOUNTER — Emergency Department (HOSPITAL_BASED_OUTPATIENT_CLINIC_OR_DEPARTMENT_OTHER)
Admission: EM | Admit: 2014-10-10 | Discharge: 2014-10-10 | Disposition: A | Discharge status: Home / Self Care | Payer: 59 | Attending: Emergency Medicine | Admitting: Emergency Medicine

## 2014-10-10 DIAGNOSIS — R0602 Shortness of breath: Secondary | ICD-10-CM | POA: Diagnosis present

## 2014-10-10 DIAGNOSIS — J45901 Unspecified asthma with (acute) exacerbation: Secondary | ICD-10-CM | POA: Diagnosis not present

## 2014-10-10 DIAGNOSIS — I509 Heart failure, unspecified: Secondary | ICD-10-CM | POA: Insufficient documentation

## 2014-10-10 DIAGNOSIS — Z79899 Other long term (current) drug therapy: Secondary | ICD-10-CM | POA: Insufficient documentation

## 2014-10-10 DIAGNOSIS — I10 Essential (primary) hypertension: Secondary | ICD-10-CM | POA: Insufficient documentation

## 2014-10-10 DIAGNOSIS — M791 Myalgia, unspecified site: Secondary | ICD-10-CM

## 2014-10-10 DIAGNOSIS — R0600 Dyspnea, unspecified: Secondary | ICD-10-CM

## 2014-10-10 DIAGNOSIS — G40909 Epilepsy, unspecified, not intractable, without status epilepticus: Secondary | ICD-10-CM | POA: Insufficient documentation

## 2014-10-10 DIAGNOSIS — Z8619 Personal history of other infectious and parasitic diseases: Secondary | ICD-10-CM | POA: Insufficient documentation

## 2014-10-10 DIAGNOSIS — R51 Headache: Secondary | ICD-10-CM | POA: Insufficient documentation

## 2014-10-10 DIAGNOSIS — Z7982 Long term (current) use of aspirin: Secondary | ICD-10-CM | POA: Insufficient documentation

## 2014-10-10 DIAGNOSIS — F319 Bipolar disorder, unspecified: Secondary | ICD-10-CM | POA: Diagnosis not present

## 2014-10-10 HISTORY — DX: Heart failure, unspecified: I50.9

## 2014-10-10 LAB — CBC WITH DIFFERENTIAL/PLATELET
Basophils Absolute: 0 10*3/uL (ref 0.0–0.1)
Basophils Relative: 0 % (ref 0–1)
Eosinophils Absolute: 0.1 10*3/uL (ref 0.0–0.7)
Eosinophils Relative: 1 % (ref 0–5)
HCT: 42.1 % (ref 36.0–46.0)
HEMOGLOBIN: 14.1 g/dL (ref 12.0–15.0)
Lymphocytes Relative: 13 % (ref 12–46)
Lymphs Abs: 1.5 10*3/uL (ref 0.7–4.0)
MCH: 30.6 pg (ref 26.0–34.0)
MCHC: 33.5 g/dL (ref 30.0–36.0)
MCV: 91.3 fL (ref 78.0–100.0)
MONOS PCT: 9 % (ref 3–12)
Monocytes Absolute: 1 10*3/uL (ref 0.1–1.0)
NEUTROS ABS: 8.8 10*3/uL — AB (ref 1.7–7.7)
NEUTROS PCT: 77 % (ref 43–77)
Platelets: 203 10*3/uL (ref 150–400)
RBC: 4.61 MIL/uL (ref 3.87–5.11)
RDW: 13.9 % (ref 11.5–15.5)
WBC: 11.3 10*3/uL — ABNORMAL HIGH (ref 4.0–10.5)

## 2014-10-10 LAB — URINALYSIS, ROUTINE W REFLEX MICROSCOPIC
Bilirubin Urine: NEGATIVE
Glucose, UA: NEGATIVE mg/dL
Hgb urine dipstick: NEGATIVE
Ketones, ur: NEGATIVE mg/dL
Nitrite: NEGATIVE
Protein, ur: NEGATIVE mg/dL
Specific Gravity, Urine: 1.01 (ref 1.005–1.030)
Urobilinogen, UA: 0.2 mg/dL (ref 0.0–1.0)
pH: 6.5 (ref 5.0–8.0)

## 2014-10-10 LAB — URINE MICROSCOPIC-ADD ON

## 2014-10-10 LAB — BASIC METABOLIC PANEL WITH GFR
Anion gap: 13 (ref 5–15)
BUN: 15 mg/dL (ref 6–23)
CO2: 27 meq/L (ref 19–32)
Calcium: 8.9 mg/dL (ref 8.4–10.5)
Chloride: 99 meq/L (ref 96–112)
Creatinine, Ser: 0.9 mg/dL (ref 0.50–1.10)
GFR calc Af Amer: 83 mL/min — ABNORMAL LOW
GFR calc non Af Amer: 72 mL/min — ABNORMAL LOW
Glucose, Bld: 87 mg/dL (ref 70–99)
Potassium: 3.9 meq/L (ref 3.7–5.3)
Sodium: 139 meq/L (ref 137–147)

## 2014-10-10 LAB — TROPONIN I: Troponin I: <0.3 ng/mL

## 2014-10-10 LAB — PRO B NATRIURETIC PEPTIDE: Pro B Natriuretic peptide (BNP): 241.3 pg/mL — ABNORMAL HIGH (ref 0–125)

## 2014-10-10 MED ORDER — ONDANSETRON 4 MG PO TBDP: ORAL_TABLET | ORAL | Status: DC

## 2014-10-10 NOTE — ED Provider Notes (Signed)
CSN: [id number]     Arrival date & time 10/10/14  1159 History   First MD Initiated Contact with Patient 10/10/14 1359     Chief Complaint  Patient presents with  . Shortness of Breath     (Consider location/radiation/quality/duration/timing/severity/associated sxs/prior Treatment) HPI The patient reports for a week now she's had symptoms of generalized body aches. She reports several days ago she had diarrhea for a couple of days. Then subsequently a couple days later she had of several episodes of vomiting. She reports she's felt mildly short of breath. There is been minimal associated cough. No sputum production. No fever but general myalgia. No chest pain. Patient also reports she's had some mild generalized headache as well. The patient reports that her main concern in coming to the emergency department was to make sure that she wasn't having congestive heart failure again. She has not had a lower extremity swelling or calf pain. Past Medical History  Diagnosis Date  . Epilepsy   . Bipolar 1 disorder   . Asthma   . Sarcoidosis   . Hypertension   . CHF (congestive heart failure)    Past Surgical History  Procedure Laterality Date  . Tonsillectomy    . Tubal ligation     Family History  Problem Relation Age of Onset  . Pancreatic cancer Mother   . Hypertension Mother   . Diabetes Mother   . Heart disease Father   . Gout Father   . Cirrhosis Father   . Heart disease Sister     CHF   History  Substance Use Topics  . Smoking status: Never Smoker   . Smokeless tobacco: Not on file  . Alcohol Use: No   OB History    No data available     Review of Systems  10 Systems reviewed and are negative for acute change except as noted in the HPI.   Allergies  Review of patient's allergies indicates no known allergies.  Home Medications   Prior to Admission medications   Medication Sig Start Date End Date Taking? Authorizing Provider  albuterol (PROVENTIL HFA;VENTOLIN  HFA) 108 (90 BASE) MCG/ACT inhaler Inhale 2 puffs into the lungs every 6 (six) hours as needed. For wheeze or shortness of breath   Yes Historical Provider, MD  aspirin 325 MG EC tablet Take 1 tablet (325 mg total) by mouth daily. 09/02/14  Yes Lewayne BuntingBrian S Crenshaw, MD  carvedilol (COREG) 6.25 MG tablet Take 1 tablet (6.25 mg total) by mouth 2 (two) times daily with a meal. 07/28/14  Yes Adeline C Viyuoh, MD  clonazePAM (KLONOPIN) 1 MG tablet Take 1 mg by mouth at bedtime.    Yes Historical Provider, MD  furosemide (LASIX) 20 MG tablet Take 1 tablet (20 mg total) by mouth daily. 09/02/14  Yes Lewayne BuntingBrian S Crenshaw, MD  lisinopril (PRINIVIL,ZESTRIL) 10 MG tablet Take 1 tablet (10 mg total) by mouth daily. 09/02/14  Yes Lewayne BuntingBrian S Crenshaw, MD  Multiple Vitamin (MULTIVITAMIN WITH MINERALS) TABS Take 1 tablet by mouth daily.   Yes Historical Provider, MD  cefUROXime (CEFTIN) 500 MG tablet Take 1 tablet (500 mg total) by mouth 2 (two) times daily with a meal. 07/28/14   Adeline C Viyuoh, MD  MAGNESIUM PO Take 1 tablet by mouth daily.    Historical Provider, MD  traMADol (ULTRAM) 50 MG tablet Take 1 tablet (50 mg total) by mouth every 6 (six) hours as needed for severe pain. 07/28/14   Kela MillinAdeline C Viyuoh, MD  BP 155/76 mmHg  Pulse 68  Temp(Src) 98.2 F (36.8 C) (Oral)  Resp 18  Ht 5\' 2"  (1.575 m)  Wt 164 lb (74.39 kg)  BMI 29.99 kg/m2  SpO2 100%  LMP 09/09/2014 Physical Exam  Constitutional: She is oriented to person, place, and time. She appears well-developed and well-nourished.  HENT:  Head: Normocephalic and atraumatic.  Eyes: EOM are normal. Pupils are equal, round, and reactive to light.  Neck: Neck supple.  Cardiovascular: Normal rate, regular rhythm, normal heart sounds and intact distal pulses.   Pulmonary/Chest: Effort normal and breath sounds normal.  Abdominal: Soft. Bowel sounds are normal. She exhibits no distension. There is no tenderness.  Musculoskeletal: Normal range of motion. She exhibits  no edema.  Neurological: She is alert and oriented to person, place, and time. She has normal strength. Coordination normal. GCS eye subscore is 4. GCS verbal subscore is 5. GCS motor subscore is 6.  Skin: Skin is warm, dry and intact.  Psychiatric: She has a normal mood and affect.    ED Course  Procedures (including critical care time) Labs Review Labs Reviewed  BASIC METABOLIC PANEL - Abnormal; Notable for the following:    GFR calc non Af Amer 72 (*)    GFR calc Af Amer 83 (*)    All other components within normal limits  PRO B NATRIURETIC PEPTIDE - Abnormal; Notable for the following:    Pro B Natriuretic peptide (BNP) 241.3 (*)    All other components within normal limits  CBC WITH DIFFERENTIAL - Abnormal; Notable for the following:    WBC 11.3 (*)    Neutro Abs 8.8 (*)    All other components within normal limits  URINALYSIS, ROUTINE W REFLEX MICROSCOPIC - Abnormal; Notable for the following:    APPearance CLOUDY (*)    Leukocytes, UA TRACE (*)    All other components within normal limits  URINE MICROSCOPIC-ADD ON - Abnormal; Notable for the following:    Squamous Epithelial / LPF FEW (*)    Bacteria, UA FEW (*)    All other components within normal limits  TROPONIN I    Imaging Review Dg Chest 2 View  10/10/2014   CLINICAL DATA:  Cough and congestion with fever for several days  EXAM: CHEST  2 VIEW  COMPARISON:  July 27, 2014  FINDINGS: Lungs are clear. Heart size and pulmonary vascularity are normal. No adenopathy. No bone lesions.  IMPRESSION: No edema or consolidation.   Electronically Signed   By: Bretta BangWilliam  Woodruff M.D.   On: 10/10/2014 13:34     EKG Interpretation None      MDM   Final diagnoses:  Dyspnea  Myalgia   At this point in time the patient's symptoms are more consistent with a viral illness. She describes a weeks worth of variable symptoms of vomiting diarrhea or generalized myalgia. Diagnostic data at this time is not suggestive of CHF, nor is  her clinical appearance. The patient does not appear to need antibiotic therapy currently. Plan will be for continuing her regular medications and following up with her family physician.    Arby BarretteMarcy Arlander Gillen, MD 10/10/14 1537

## 2014-10-10 NOTE — Discharge Instructions (Signed)
Weakness Weakness is a lack of strength. It may be felt all over the body (generalized) or in one specific part of the body (focal). Some causes of weakness can be serious. You may need further medical evaluation, especially if you are elderly or you have a history of immunosuppression (such as chemotherapy or HIV), kidney disease, heart disease, or diabetes. CAUSES  Weakness can be caused by many different things, including:  Infection.  Physical exhaustion.  Internal bleeding or other blood loss that results in a lack of red blood cells (anemia).  Dehydration. This cause is more common in elderly people.  Side effects or electrolyte abnormalities from medicines, such as pain medicines or sedatives.  Emotional distress, anxiety, or depression.  Circulation problems, especially severe peripheral arterial disease.  Heart disease, such as rapid atrial fibrillation, bradycardia, or heart failure.  Nervous system disorders, such as Guillain-Barr syndrome, multiple sclerosis, or stroke. DIAGNOSIS  To find the cause of your weakness, your caregiver will take your history and perform a physical exam. Lab tests or X-rays may also be ordered, if needed. TREATMENT  Treatment of weakness depends on the cause of your symptoms and can vary greatly. HOME CARE INSTRUCTIONS   Rest as needed.  Eat a well-balanced diet.  Try to get some exercise every day.  Only take over-the-counter or prescription medicines as directed by your caregiver. SEEK MEDICAL CARE IF:   Your weakness seems to be getting worse or spreads to other parts of your body.  You develop new aches or pains. SEEK IMMEDIATE MEDICAL CARE IF:   You cannot perform your normal daily activities, such as getting dressed and feeding yourself.  You cannot walk up and down stairs, or you feel exhausted when you do so.  You have shortness of breath or chest pain.  You have difficulty moving parts of your body.  You have weakness  in only one area of the body or on only one side of the body.  You have a fever.  You have trouble speaking or swallowing.  You cannot control your bladder or bowel movements.  You have black or bloody vomit or stools. MAKE SURE YOU:  Understand these instructions.  Will watch your condition.  Will get help right away if you are not doing well or get worse. Document Released: 11/26/2005 Document Revised: 05/27/2012 Document Reviewed: 01/25/2012 [facility]Exit[facility] Patient Information 2015 [place]ExitCare, MarylandLLC. This information is not intended to replace advice given to you by your health care provider. Make sure you discuss any questions you have with your health care provider. Shortness of Breath Shortness of breath means you have trouble breathing. It could also mean that you have a medical problem. You should get immediate medical care for shortness of breath. CAUSES   Not enough oxygen in the air such as with high altitudes or a smoke-filled room.  Certain lung diseases, infections, or problems.  Heart disease or conditions, such as angina or heart failure.  Low red blood cells (anemia).  Poor physical fitness, which can cause shortness of breath when you exercise.  Chest or back injuries or stiffness.  Being overweight.  Smoking.  Anxiety, which can make you feel like you are not getting enough air. DIAGNOSIS  Serious medical problems can often be found during your physical exam. Tests may also be done to determine why you are having shortness of breath. Tests may include:  Chest X-rays.  Lung function tests.  Blood tests.  An electrocardiogram (ECG).  An ambulatory electrocardiogram. An  ambulatory ECG records your heartbeat patterns over a 24-hour period.  Exercise testing.  A transthoracic echocardiogram (TTE). During echocardiography, sound waves are used to evaluate how blood flows through your heart.  A transesophageal echocardiogram (TEE).  Imaging scans. Your  health care provider may not be able to find a cause for your shortness of breath after your exam. In this case, it is important to have a follow-up exam with your health care provider as directed.  TREATMENT  Treatment for shortness of breath depends on the cause of your symptoms and can vary greatly. HOME CARE INSTRUCTIONS   Do not smoke. Smoking is a common cause of shortness of breath. If you smoke, ask for help to quit.  Avoid being around chemicals or things that may bother your breathing, such as paint fumes and dust.  Rest as needed. Slowly resume your usual activities.  If medicines were prescribed, take them as directed for the full length of time directed. This includes oxygen and any inhaled medicines.  Keep all follow-up appointments as directed by your health care provider. SEEK MEDICAL CARE IF:   Your condition does not improve in the time expected.  You have a hard time doing your normal activities even with rest.  You have any new symptoms. SEEK IMMEDIATE MEDICAL CARE IF:   Your shortness of breath gets worse.  You feel light-headed, faint, or develop a cough not controlled with medicines.  You start coughing up blood.  You have pain with breathing.  You have chest pain or pain in your arms, shoulders, or abdomen.  You have a fever.  You are unable to walk up stairs or exercise the way you normally do. MAKE SURE YOU:  Understand these instructions.  Will watch your condition.  Will get help right away if you are not doing well or get worse. Document Released: 08/21/2001 Document Revised: 12/01/2013 Document Reviewed: 02/11/2012 [facility] Patient Information 2015 [place]ExitCare, MarylandLLC. This information is not intended to replace advice given to you by your health care provider. Make sure you discuss any questions you have with your health care provider.

## 2014-10-10 NOTE — ED Notes (Signed)
Pt presents to ED with complaints of shortness of breath, nausea, vomiting body aches and headache. Pt was recently diagnosed with CHF a month ago.

## 2014-10-10 NOTE — ED Notes (Signed)
Pt eating chips and talking with friend in room.

## 2014-10-10 NOTE — ED Notes (Addendum)
Pt placed on cardiac monitor. No changes noted. MD aware.

## 2014-10-12 LAB — URINE CULTURE: Colony Count: >=100000

## 2014-10-13 ENCOUNTER — Telehealth (HOSPITAL_BASED_OUTPATIENT_CLINIC_OR_DEPARTMENT_OTHER): Payer: Self-pay | Admitting: Emergency Medicine

## 2014-10-13 NOTE — Telephone Encounter (Signed)
Post ED Visit - Positive Culture Follow-up  Culture report reviewed by antimicrobial stewardship pharmacist: []  Wes Dulaney, Pharm.D., BCPS [x]  Celedonio MiyamotoJeremy Frens, Pharm.D., BCPS []  Georgina PillionElizabeth Martin, Pharm.D., BCPS []  [place] PointMinh Pham, [address] BoulevardPharm.D., BCPS, AAHIVP []  Estella HuskMichelle Turner, Pharm.D., BCPS, AAHIVP []  Babs BertinHaley Baird, Pharm.D.   Positive urine culture >100,000 colonies/ml Klebsiella Pneumoniae Treated with none asymptomatic, and no further patient follow-up is required at this time.  Berle MullMiller, Griffon Herberg 10/13/2014, 11:36 AM

## 2014-10-21 ENCOUNTER — Ambulatory Visit: Payer: 59 | Admitting: Cardiology

## 2014-12-24 ENCOUNTER — Encounter (HOSPITAL_BASED_OUTPATIENT_CLINIC_OR_DEPARTMENT_OTHER): Payer: Self-pay | Admitting: *Deleted

## 2014-12-24 ENCOUNTER — Emergency Department (HOSPITAL_BASED_OUTPATIENT_CLINIC_OR_DEPARTMENT_OTHER)
Admission: EM | Admit: 2014-12-24 | Discharge: 2014-12-25 | Disposition: A | Discharge status: Home / Self Care | Payer: 59 | Attending: Emergency Medicine | Admitting: Emergency Medicine

## 2014-12-24 DIAGNOSIS — R51 Headache: Secondary | ICD-10-CM | POA: Diagnosis not present

## 2014-12-24 DIAGNOSIS — I509 Heart failure, unspecified: Secondary | ICD-10-CM | POA: Diagnosis not present

## 2014-12-24 DIAGNOSIS — F319 Bipolar disorder, unspecified: Secondary | ICD-10-CM | POA: Insufficient documentation

## 2014-12-24 DIAGNOSIS — G40909 Epilepsy, unspecified, not intractable, without status epilepticus: Secondary | ICD-10-CM | POA: Insufficient documentation

## 2014-12-24 DIAGNOSIS — Z7982 Long term (current) use of aspirin: Secondary | ICD-10-CM | POA: Diagnosis not present

## 2014-12-24 DIAGNOSIS — Z79899 Other long term (current) drug therapy: Secondary | ICD-10-CM | POA: Diagnosis not present

## 2014-12-24 DIAGNOSIS — I10 Essential (primary) hypertension: Secondary | ICD-10-CM | POA: Diagnosis not present

## 2014-12-24 DIAGNOSIS — J45909 Unspecified asthma, uncomplicated: Secondary | ICD-10-CM | POA: Diagnosis not present

## 2014-12-24 DIAGNOSIS — R109 Unspecified abdominal pain: Secondary | ICD-10-CM | POA: Diagnosis present

## 2014-12-24 DIAGNOSIS — Z862 Personal history of diseases of the blood and blood-forming organs and certain disorders involving the immune mechanism: Secondary | ICD-10-CM | POA: Insufficient documentation

## 2014-12-24 DIAGNOSIS — N12 Tubulo-interstitial nephritis, not specified as acute or chronic: Secondary | ICD-10-CM | POA: Diagnosis not present

## 2014-12-24 LAB — URINE MICROSCOPIC-ADD ON

## 2014-12-24 LAB — URINALYSIS, ROUTINE W REFLEX MICROSCOPIC
Glucose, UA: NEGATIVE mg/dL
Ketones, ur: 15 mg/dL — AB
Nitrite: POSITIVE — AB
PH: 5.5 (ref 5.0–8.0)
Protein, ur: 100 mg/dL — AB
Specific Gravity, Urine: 1.026 (ref 1.005–1.030)
UROBILINOGEN UA: 1 mg/dL (ref 0.0–1.0)

## 2014-12-24 MED ORDER — CEPHALEXIN 250 MG PO CAPS: 1000.0000 mg | ORAL_CAPSULE | Freq: Once | ORAL | Status: DC

## 2014-12-24 MED ORDER — DEXTROSE 5 % IV SOLN: 1.0000 g | Freq: Once | INTRAVENOUS | Status: DC

## 2014-12-24 MED ORDER — ONDANSETRON 8 MG PO TBDP
8.0000 mg | ORAL_TABLET | Freq: Once | ORAL | Status: AC
  Administered 2014-12-24: 8 mg via ORAL
  Filled 2014-12-24: qty 1

## 2014-12-24 MED ORDER — CEFTRIAXONE SODIUM 1 G IJ SOLR
INTRAMUSCULAR | Status: AC
  Administered 2014-12-24: 1000 mg via INTRAVENOUS
  Filled 2014-12-24: qty 10

## 2014-12-24 NOTE — ED Provider Notes (Signed)
CSN: [id number]     Arrival date & time 12/24/14  2042 History  This chart was scribed for Mikayla Seamen, MD by Richarda Overlie, ED Scribe. This patient was seen in room MH01/MH01 and the patient's care was started 10:53 PM.    Chief Complaint  Patient presents with  . Abdominal Pain    The history is provided by the patient. No language interpreter was used.   HPI Comments: Mikayla Sweeney is a 54 y.o. female with a history of asthma, epilepsy, bipolar 1 disorder, sarcoidosis, HTN and CHF who presents to the Emergency Department complaining of suprapubic abdominal pain for the last 2 days. She states she has pain in her right flank as well. Pt reports associated urinary frequency and says she feels like she cannot empty her bladder. She also reports malodorous urine. She reports subjective fever, chills, neck pain, nausea, vomiting and HA as associated symptoms. She says that she is nauseated currently. She denies CP and SOB. Symptoms are moderate.    Past Medical History  Diagnosis Date  . Epilepsy   . Bipolar 1 disorder   . Asthma   . Sarcoidosis   . Hypertension   . CHF (congestive heart failure)    Past Surgical History  Procedure Laterality Date  . Tonsillectomy    . Tubal ligation     Family History  Problem Relation Age of Onset  . Pancreatic cancer Mother   . Hypertension Mother   . Diabetes Mother   . Heart disease Father   . Gout Father   . Cirrhosis Father   . Heart disease Sister     CHF   History  Substance Use Topics  . Smoking status: Never Smoker   . Smokeless tobacco: Not on file  . Alcohol Use: No   OB History    No data available     Review of Systems  Constitutional: Positive for fatigue.  Respiratory: Negative for shortness of breath.   Cardiovascular: Negative for chest pain.  Gastrointestinal: Positive for vomiting and abdominal pain.  Genitourinary: Positive for frequency.  Neurological: Positive for headaches.  All other systems reviewed  and are negative.   Allergies  Review of patient's allergies indicates no known allergies.  Home Medications   Prior to Admission medications   Medication Sig Start Date End Date Taking? Authorizing Provider  amLODipine (NORVASC) 2.5 MG tablet Take 2.5 mg by mouth daily.   Yes Historical Provider, MD  losartan (COZAAR) 100 MG tablet Take 100 mg by mouth daily.   Yes Historical Provider, MD  albuterol (PROVENTIL HFA;VENTOLIN HFA) 108 (90 BASE) MCG/ACT inhaler Inhale 2 puffs into the lungs every 6 (six) hours as needed. For wheeze or shortness of breath    Historical Provider, MD  aspirin 325 MG EC tablet Take 1 tablet (325 mg total) by mouth daily. 09/02/14   Lewayne Bunting, MD  carvedilol (COREG) 6.25 MG tablet Take 1 tablet (6.25 mg total) by mouth 2 (two) times daily with a meal. 07/28/14   Adeline C Viyuoh, MD  cefUROXime (CEFTIN) 500 MG tablet Take 1 tablet (500 mg total) by mouth 2 (two) times daily with a meal. 07/28/14   Adeline C Viyuoh, MD  clonazePAM (KLONOPIN) 1 MG tablet Take 1 mg by mouth at bedtime.     Historical Provider, MD  furosemide (LASIX) 20 MG tablet Take 1 tablet (20 mg total) by mouth daily. 09/02/14   Lewayne Bunting, MD  lisinopril (PRINIVIL,ZESTRIL) 10 MG tablet  Take 1 tablet (10 mg total) by mouth daily. 09/02/14   Lewayne BuntingBrian S Crenshaw, MD  MAGNESIUM PO Take 1 tablet by mouth daily.    Historical Provider, MD  Multiple Vitamin (MULTIVITAMIN WITH MINERALS) TABS Take 1 tablet by mouth daily.    Historical Provider, MD  ondansetron (ZOFRAN ODT) 4 MG disintegrating tablet 4mg  ODT q4 hours prn nausea/vomit 10/10/14   Arby BarretteMarcy Pfeiffer, MD  traMADol (ULTRAM) 50 MG tablet Take 1 tablet (50 mg total) by mouth every 6 (six) hours as needed for severe pain. 07/28/14   Kela MillinAdeline C Viyuoh, MD   BP 156/81 mmHg  Pulse 104  Temp(Src) 98.6 F (37 C) (Oral)  Resp 18  Ht 5\' 3"  (1.6 m)  Wt 160 lb (72.576 kg)  BMI 28.35 kg/m2  SpO2 100%  LMP 12/10/2014   Physical Exam  General:  Well-developed, well-nourished female in no acute distress; appearance consistent with age of record HENT: normocephalic; atraumatic Eyes: pupils equal, round and reactive to light; extraocular muscles intact Neck: supple; posterior soft tissue tenderness with muscle spasms palpated Heart: regular rate and rhythm Lungs: clear to auscultation bilaterally Abdomen: soft; nondistended; nontender; no masses or hepatosplenomegaly; bowel sounds present; diffuse tenderness most prominent in the suprapubic region.  GU: Right CVA tenderness  Extremities: No deformity; full range of motion; pulses normal Neurologic: Awake, alert and oriented; motor function intact in all extremities and symmetric; no facial droop Skin: Warm and dry Psychiatric: Normal mood and affect   ED Course  Procedures   DIAGNOSTIC STUDIES: Oxygen Saturation is 100% on RA, normal by my interpretation.    COORDINATION OF CARE: 10:58 PM Discussed treatment plan with pt at bedside and pt agreed to plan.    MDM   Nursing notes and vitals signs, including pulse oximetry, reviewed.  Summary of this visit's results, reviewed by myself:  Labs:  Results for orders placed or performed during the hospital encounter of 12/24/14 (from the past 24 hour(s))  Urinalysis, Routine w reflex microscopic     Status: Abnormal   Collection Time: 12/24/14  8:55 PM  Result Value Ref Range   Color, Urine YELLOW YELLOW   APPearance TURBID (A) CLEAR   Specific Gravity, Urine 1.026 1.005 - 1.030   pH 5.5 5.0 - 8.0   Glucose, UA NEGATIVE NEGATIVE mg/dL   Hgb urine dipstick SMALL (A) NEGATIVE   Bilirubin Urine SMALL (A) NEGATIVE   Ketones, ur 15 (A) NEGATIVE mg/dL   Protein, ur 478100 (A) NEGATIVE mg/dL   Urobilinogen, UA 1.0 0.0 - 1.0 mg/dL   Nitrite POSITIVE (A) NEGATIVE   Leukocytes, UA MODERATE (A) NEGATIVE  Urine microscopic-add on     Status: Abnormal   Collection Time: 12/24/14  8:55 PM  Result Value Ref Range   Squamous Epithelial  / LPF FEW (A) RARE   WBC, UA 21-50 <3 WBC/hpf   RBC / HPF 3-6 <3 RBC/hpf   Bacteria, UA MANY (A) RARE   Urine-Other MUCOUS PRESENT    Will treat for early pyelonephritis.  I personally performed the services described in this documentation, which was scribed in my presence. The recorded information has been reviewed and is accurate.     Mikayla SeamenJohn L Everardo Voris, MD 12/25/14 0002

## 2014-12-24 NOTE — ED Notes (Signed)
Abdominal pain and vomiting x 2 days. Headache all week. Fever earlier in the week.

## 2014-12-24 NOTE — ED Notes (Signed)
C/o abd pain x 2 days w odor to urine,  Also ha for awhile  Had vomiting on wed but none today

## 2014-12-25 MED ORDER — CYCLOBENZAPRINE HCL 10 MG PO TABS
10.0000 mg | ORAL_TABLET | Freq: Once | ORAL | Status: AC
  Administered 2014-12-25: 10 mg via ORAL
  Filled 2014-12-25: qty 1

## 2014-12-25 MED ORDER — FLUCONAZOLE 150 MG PO TABS: ORAL_TABLET | ORAL | Status: DC

## 2014-12-25 MED ORDER — CEPHALEXIN 500 MG PO CAPS: 500.0000 mg | ORAL_CAPSULE | Freq: Three times a day (TID) | ORAL | Status: DC

## 2014-12-25 MED ORDER — PHENAZOPYRIDINE HCL 100 MG PO TABS
200.0000 mg | ORAL_TABLET | Freq: Once | ORAL | Status: AC
  Administered 2014-12-25: 200 mg via ORAL
  Filled 2014-12-25: qty 2

## 2014-12-25 MED ORDER — CYCLOBENZAPRINE HCL 10 MG PO TABS: 10.0000 mg | ORAL_TABLET | Freq: Three times a day (TID) | ORAL | Status: DC | PRN

## 2014-12-25 MED ORDER — ONDANSETRON 8 MG PO TBDP: 8.0000 mg | ORAL_TABLET | Freq: Three times a day (TID) | ORAL | Status: DC | PRN

## 2014-12-25 MED ORDER — PHENAZOPYRIDINE HCL 200 MG PO TABS: 200.0000 mg | ORAL_TABLET | Freq: Three times a day (TID) | ORAL | Status: DC

## 2014-12-25 NOTE — Discharge Instructions (Signed)
Pyelonephritis, Adult °Pyelonephritis is a kidney infection. In general, there are 2 main types of pyelonephritis: °· Infections that come on quickly without any warning (acute pyelonephritis). °· Infections that persist for a long period of time (chronic pyelonephritis). °CAUSES  °Two main causes of pyelonephritis are: °· Bacteria traveling from the bladder to the kidney. This is a problem especially in pregnant women. The urine in the bladder can become filled with bacteria from multiple causes, including: °¨ Inflammation of the prostate gland (prostatitis). °¨ Sexual intercourse in females. °¨ Bladder infection (cystitis). °· Bacteria traveling from the bloodstream to the tissue part of the kidney. °Problems that may increase your risk of getting a kidney infection include: °· Diabetes. °· Kidney stones or bladder stones. °· Cancer. °· Catheters placed in the bladder. °· Other abnormalities of the kidney or ureter. °SYMPTOMS  °· Abdominal pain. °· Pain in the side or flank area. °· Fever. °· Chills. °· Upset stomach. °· Blood in the urine (dark urine). °· Frequent urination. °· Strong or persistent urge to urinate. °· Burning or stinging when urinating. °DIAGNOSIS  °Your caregiver may diagnose your kidney infection based on your symptoms. A urine sample may also be taken. °TREATMENT  °In general, treatment depends on how severe the infection is.  °· If the infection is mild and caught early, your caregiver may treat you with oral antibiotics and send you home. °· If the infection is more severe, the bacteria may have gotten into the bloodstream. This will require intravenous (IV) antibiotics and a hospital stay. Symptoms may include: °¨ High fever. °¨ Severe flank pain. °¨ Shaking chills. °· Even after a hospital stay, your caregiver may require you to be on oral antibiotics for a period of time. °· Other treatments may be required depending upon the cause of the infection. °HOME CARE INSTRUCTIONS  °· Take your  antibiotics as directed. Finish them even if you start to feel better. °· Make an appointment to have your urine checked to make sure the infection is gone. °· Drink enough fluids to keep your urine clear or pale yellow. °· Take medicines for the bladder if you have urgency and frequency of urination as directed by your caregiver. °SEEK IMMEDIATE MEDICAL CARE IF:  °· You have a fever or persistent symptoms for more than 2-3 days. °· You have a fever and your symptoms suddenly get worse. °· You are unable to take your antibiotics or fluids. °· You develop shaking chills. °· You experience extreme weakness or fainting. °· There is no improvement after 2 days of treatment. °MAKE SURE YOU: °· Understand these instructions. °· Will watch your condition. °· Will get help right away if you are not doing well or get worse. °Document Released: 11/26/2005 Document Revised: 05/27/2012 Document Reviewed: 05/02/2011 °ExitCare® Patient Information ©2015 Exit[employer], LLC. This information is not intended to replace advice given to you by your health care provider. Make sure you discuss any questions you have with your health care provider. ° °

## 2014-12-28 LAB — URINE CULTURE

## 2014-12-29 ENCOUNTER — Telehealth (HOSPITAL_BASED_OUTPATIENT_CLINIC_OR_DEPARTMENT_OTHER): Payer: Self-pay | Admitting: Emergency Medicine

## 2014-12-29 NOTE — Telephone Encounter (Signed)
Post ED Visit - Positive Culture Follow-up  Culture report reviewed by antimicrobial stewardship pharmacist: []  Mikayla Sweeney, Pharm.D., BCPS [x]  Mikayla Sweeney, [address]Pharm[address]D., BCPS []  Mikayla Sweeney, Pharm.D., BCPS []  Mikayla Sweeney, [place]Pharm.D., BCPS, AAHIVP []  Mikayla Sweeney, Pharm.D., BCPS, AAHIVP []  Mikayla Sweeney, [address]Pharm[address]D., BCPS  Positive urine  Culture Klebsiella Treated with cephalexin, organism sensitive to the same and no further patient follow-up is required at this time.  Mikayla Sweeney, Mikayla Sweeney 12/29/2014, 1:59 PM

## 2015-05-07 NOTE — Progress Notes (Signed)
HPI: FU diastolic CHF. Admitted 8/15 with acute diastolic CHF felt hypertensive mediated. Echo 8/15 showed normal LV function, grade 1 diastolic dysfunction, moderate LVH. Outpt nuclear study scheduled but pt no showed twice.   Current Outpatient Prescriptions  Medication Sig Dispense Refill  . albuterol (PROVENTIL HFA;VENTOLIN HFA) 108 (90 BASE) MCG/ACT inhaler Inhale 2 puffs into the lungs every 6 (six) hours as needed. For wheeze or shortness of breath    . amLODipine (NORVASC) 2.5 MG tablet Take 2.5 mg by mouth daily.    Marland Kitchen. aspirin 325 MG EC tablet Take 1 tablet (325 mg total) by mouth daily. 30 tablet 0  . carvedilol (COREG) 6.25 MG tablet Take 1 tablet (6.25 mg total) by mouth 2 (two) times daily with a meal. 60 tablet 0  . cephALEXin (KEFLEX) 500 MG capsule Take 1 capsule (500 mg total) by mouth 3 (three) times daily. 30 capsule 0  . clonazePAM (KLONOPIN) 1 MG tablet Take 1 mg by mouth at bedtime.     . cyclobenzaprine (FLEXERIL) 10 MG tablet Take 1 tablet (10 mg total) by mouth 3 (three) times daily as needed for muscle spasms. 20 tablet 0  . fluconazole (DIFLUCAN) 150 MG tablet Take 1 tablet as needed for vaginal yeast infection. May repeat in 3 days if necessary. 2 tablet 0  . furosemide (LASIX) 20 MG tablet Take 1 tablet (20 mg total) by mouth daily. 30 tablet 0  . losartan (COZAAR) 100 MG tablet Take 100 mg by mouth daily.    . Multiple Vitamin (MULTIVITAMIN WITH MINERALS) TABS Take 1 tablet by mouth daily.    . ondansetron (ZOFRAN ODT) 8 MG disintegrating tablet Take 1 tablet (8 mg total) by mouth every 8 (eight) hours as needed for nausea or vomiting. 10 tablet 0  . phenazopyridine (PYRIDIUM) 200 MG tablet Take 1 tablet (200 mg total) by mouth 3 (three) times daily. 6 tablet 0  . traMADol (ULTRAM) 50 MG tablet Take 1 tablet (50 mg total) by mouth every 6 (six) hours as needed for severe pain. 30 tablet 0  . [DISCONTINUED] lisinopril (PRINIVIL,ZESTRIL) 10 MG tablet Take 1  tablet (10 mg total) by mouth daily. 30 tablet 0  . [DISCONTINUED] loratadine (CLARITIN) 10 MG tablet Take 1 tablet (10 mg total) by mouth daily. 30 tablet 0  . [DISCONTINUED] QUEtiapine (SEROQUEL XR) 400 MG 24 hr tablet Take 400 mg by mouth at bedtime.    . [DISCONTINUED] topiramate (TOPAMAX) 200 MG tablet Take 600 mg by mouth at bedtime.     No current facility-administered medications for this visit.     Past Medical History  Diagnosis Date  . Epilepsy   . Bipolar 1 disorder   . Asthma   . Sarcoidosis   . Hypertension   . CHF (congestive heart failure)     Past Surgical History  Procedure Laterality Date  . Tonsillectomy    . Tubal ligation      History   Social History  . Marital Status: Married    Spouse Name: N/A  . Number of Children: N/A  . Years of Education: N/A   Occupational History  . Not on file.   Social History Main Topics  . Smoking status: Never Smoker   . Smokeless tobacco: Not on file  . Alcohol Use: No  . Drug Use: No  . Sexual Activity: Not on file   Other Topics Concern  . Not on file   Social History Narrative  ROS: no fevers or chills, productive cough, hemoptysis, dysphasia, odynophagia, melena, hematochezia, dysuria, hematuria, rash, seizure activity, orthopnea, PND, pedal edema, claudication. Remaining systems are negative.  Physical Exam: Well-developed well-nourished in no acute distress.  Skin is warm and dry.  HEENT is normal.  Neck is supple.  Chest is clear to auscultation with normal expansion.  Cardiovascular exam is regular rate and rhythm.  Abdominal exam nontender or distended. No masses palpated. Extremities show no edema. neuro grossly intact  ECG     This encounter was created in error - please disregard.

## 2015-05-10 ENCOUNTER — Encounter: Payer: 59 | Admitting: Cardiology

## 2015-05-31 ENCOUNTER — Encounter: Payer: Self-pay | Admitting: Cardiology

## 2017-03-01 ENCOUNTER — Ambulatory Visit (HOSPITAL_COMMUNITY)
Admission: EM | Admit: 2017-03-01 | Discharge: 2017-03-01 | Disposition: A | Discharge status: Home / Self Care | Payer: 59 | Attending: Family Medicine | Admitting: Family Medicine

## 2017-03-01 ENCOUNTER — Encounter (HOSPITAL_COMMUNITY): Payer: Self-pay

## 2017-03-01 DIAGNOSIS — R300 Dysuria: Secondary | ICD-10-CM

## 2017-03-01 DIAGNOSIS — I10 Essential (primary) hypertension: Secondary | ICD-10-CM | POA: Diagnosis not present

## 2017-03-01 LAB — POCT URINALYSIS DIP (DEVICE)
Glucose, UA: NEGATIVE mg/dL
Hgb urine dipstick: NEGATIVE
Ketones, ur: 15 mg/dL — AB
LEUKOCYTES UA: NEGATIVE
NITRITE: NEGATIVE
Protein, ur: 100 mg/dL — AB
SPECIFIC GRAVITY, URINE: 1.025 (ref 1.005–1.030)
Urobilinogen, UA: 0.2 mg/dL (ref 0.0–1.0)
pH: 6.5 (ref 5.0–8.0)

## 2017-03-01 MED ORDER — CARVEDILOL 6.25 MG PO TABS: 6.2500 mg | ORAL_TABLET | Freq: Two times a day (BID) | ORAL | 0 refills | Status: DC

## 2017-03-01 MED ORDER — AMLODIPINE BESYLATE 2.5 MG PO TABS: 2.5000 mg | ORAL_TABLET | Freq: Every day | ORAL | 0 refills | Status: DC

## 2017-03-01 MED ORDER — CLONAZEPAM 1 MG PO TABS: 1.0000 mg | ORAL_TABLET | Freq: Every day | ORAL | 0 refills | Status: DC

## 2017-03-01 MED ORDER — CLONAZEPAM 1 MG PO TABS: 1.0000 mg | ORAL_TABLET | Freq: Every day | ORAL | 1 refills | Status: DC

## 2017-03-01 MED ORDER — PHENAZOPYRIDINE HCL 200 MG PO TABS: 200.0000 mg | ORAL_TABLET | Freq: Three times a day (TID) | ORAL | 0 refills | Status: DC

## 2017-03-01 MED ORDER — FUROSEMIDE 20 MG PO TABS: 20.0000 mg | ORAL_TABLET | Freq: Every day | ORAL | 0 refills | Status: DC

## 2017-03-01 MED ORDER — CLONIDINE HCL 0.1 MG PO TABS
ORAL_TABLET | ORAL | Status: AC
  Filled 2017-03-01: qty 1

## 2017-03-01 MED ORDER — CLONIDINE HCL 0.1 MG PO TABS
0.1000 mg | ORAL_TABLET | Freq: Once | ORAL | Status: AC
  Administered 2017-03-01: 0.1 mg via ORAL

## 2017-03-01 MED ORDER — LOSARTAN POTASSIUM 100 MG PO TABS: 100.0000 mg | ORAL_TABLET | Freq: Every day | ORAL | 0 refills | Status: DC

## 2017-03-01 NOTE — ED Triage Notes (Signed)
Pt having UTI symptoms for 1 week. Having nausea, abdominal pain and not feeling well. Has tried cranberry juice, probiotics, azo without relief. Also needs a refill of klonopin for anxiety/epilepsy.

## 2017-03-01 NOTE — ED Provider Notes (Signed)
CSN: [id number]     Arrival date & time 03/01/17  1319 History   None    Chief Complaint  Patient presents with  . Urinary Tract Infection   (Consider location/radiation/quality/duration/timing/severity/associated sxs/prior Treatment) Patient c/o urinary sx's for 3 days.  She is in between Pcp's and needs refills.  She has hx of HTN and sz disorder   The history is provided by the patient.  Urinary Tract Infection  Pain severity:  Mild Onset quality:  Sudden Duration:  2 days Timing:  Constant Progression:  Waxing and waning   Past Medical History:  Diagnosis Date  . Asthma   . Bipolar 1 disorder (HCC)   . CHF (congestive heart failure) (HCC)   . Epilepsy (HCC)   . Hypertension   . Sarcoidosis [facility](HCC)    Past Surgical History:  Procedure Laterality Date  . TONSILLECTOMY    . TUBAL LIGATION     Family History  Problem Relation Age of Onset  . Pancreatic cancer Mother   . Hypertension Mother   . Diabetes Mother   . Heart disease Father   . Gout Father   . Cirrhosis Father   . Heart disease Sister     CHF   Social History  Substance Use Topics  . Smoking status: Never Smoker  . Smokeless tobacco: Never Used  . Alcohol use No   OB History    No data available     Review of Systems  Constitutional: Negative.   HENT: Negative.   Eyes: Negative.   Respiratory: Negative.   Cardiovascular: Negative.   Gastrointestinal: Negative.   Endocrine: Negative.   Genitourinary: Positive for dysuria.  Musculoskeletal: Negative.   Allergic/Immunologic: Negative.   Neurological: Negative.   Hematological: Negative.   Psychiatric/Behavioral: Negative.     Allergies  Patient has no known allergies.  Home Medications   Prior to Admission medications   Medication Sig Start Date End Date Taking? Authorizing Provider  albuterol (PROVENTIL HFA;VENTOLIN HFA) 108 (90 BASE) MCG/ACT inhaler Inhale 2 puffs into the lungs every 6 (six) hours as needed. For wheeze or shortness  of breath    Historical Provider, MD  amLODipine (NORVASC) 2.5 MG tablet Take 1 tablet (2.5 mg total) by mouth daily. 03/01/17   Deatra CanterWilliam J Quinesha Selinger, FNP  aspirin 325 MG EC tablet Take 1 tablet (325 mg total) by mouth daily. 09/02/14   Lewayne BuntingBrian S Crenshaw, MD  carvedilol (COREG) 6.25 MG tablet Take 1 tablet (6.25 mg total) by mouth 2 (two) times daily with a meal. 03/01/17   Deatra CanterWilliam J Arthuro Canelo, FNP  cephALEXin (KEFLEX) 500 MG capsule Take 1 capsule (500 mg total) by mouth 3 (three) times daily. 12/25/14   John Molpus, MD  clonazePAM (KLONOPIN) 1 MG tablet Take 1 tablet (1 mg total) by mouth at bedtime. 03/01/17   Deatra CanterWilliam J Keshav Winegar, FNP  cyclobenzaprine (FLEXERIL) 10 MG tablet Take 1 tablet (10 mg total) by mouth 3 (three) times daily as needed for muscle spasms. 12/25/14   John Molpus, MD  fluconazole (DIFLUCAN) 150 MG tablet Take 1 tablet as needed for vaginal yeast infection. May repeat in 3 days if necessary. 12/25/14   John Molpus, MD  furosemide (LASIX) 20 MG tablet Take 1 tablet (20 mg total) by mouth daily. 03/01/17   Deatra CanterWilliam J Katyana Trolinger, FNP  losartan (COZAAR) 100 MG tablet Take 1 tablet (100 mg total) by mouth daily. 03/01/17   Deatra CanterWilliam J Fenna Semel, FNP  Multiple Vitamin (MULTIVITAMIN WITH MINERALS) TABS Take 1 tablet by  mouth daily.    Historical Provider, MD  ondansetron (ZOFRAN ODT) 8 MG disintegrating tablet Take 1 tablet (8 mg total) by mouth every 8 (eight) hours as needed for nausea or vomiting. 12/25/14   John Molpus, MD  phenazopyridine (PYRIDIUM) 200 MG tablet Take 1 tablet (200 mg total) by mouth 3 (three) times daily. 03/01/17   Deatra Canter, FNP  traMADol (ULTRAM) 50 MG tablet Take 1 tablet (50 mg total) by mouth every 6 (six) hours as needed for severe pain. 07/28/14   Kela Millin, MD   Meds Ordered and Administered this Visit   Medications  cloNIDine (CATAPRES) tablet 0.1 mg (0.1 mg Oral Given 03/01/17 1402)    BP (!) 195/123 (BP Location: Left Arm)   Pulse 89   Temp 97.7 F (36.5 C)  (Oral)   Resp 20   SpO2 98%  No data found.   Physical Exam  Constitutional: She is oriented to person, place, and time. She appears well-developed and well-nourished.  HENT:  Head: Normocephalic and atraumatic.  Right Ear: External ear normal.  Left Ear: External ear normal.  Mouth/Throat: Oropharynx is clear and moist.  Eyes: Conjunctivae and EOM are normal. Pupils are equal, round, and reactive to light.  Neck: Normal range of motion.  Cardiovascular: Normal rate, regular rhythm and normal heart sounds.   Pulmonary/Chest: Effort normal.  Abdominal: Soft.  Neurological: She is alert and oriented to person, place, and time.  Nursing note and vitals reviewed.   Urgent Care Course     Procedures (including critical care time)  Labs Review Labs Reviewed  POCT URINALYSIS DIP (DEVICE) - Abnormal; Notable for the following:       Result Value   Bilirubin Urine SMALL (*)    Ketones, ur 15 (*)    Protein, ur 100 (*)    All other components within normal limits    Imaging Review No results found.   Visual Acuity Review  Right Eye Distance:   Left Eye Distance:   Bilateral Distance:    Right Eye Near:   Left Eye Near:    Bilateral Near:         MDM   1. Essential hypertension   2. Dysuria    Refilled clonazepam BP meds and referral to PCP Dr. Maryelizabeth Rowan      Deatra Canter, FNP 03/01/17 1513    Deatra Canter, FNP 03/01/17 [phone]

## 2017-04-02 ENCOUNTER — Emergency Department (HOSPITAL_BASED_OUTPATIENT_CLINIC_OR_DEPARTMENT_OTHER)
Admission: EM | Admit: 2017-04-02 | Discharge: 2017-04-02 | Disposition: A | Discharge status: Home / Self Care | Payer: 59 | Attending: Emergency Medicine | Admitting: Emergency Medicine

## 2017-04-02 ENCOUNTER — Emergency Department (HOSPITAL_COMMUNITY)
Admission: EM | Admit: 2017-04-02 | Discharge: 2017-04-02 | Disposition: A | Discharge status: Home / Self Care | Payer: 59 | Attending: Dermatology | Admitting: Dermatology

## 2017-04-02 ENCOUNTER — Encounter (HOSPITAL_BASED_OUTPATIENT_CLINIC_OR_DEPARTMENT_OTHER): Payer: Self-pay

## 2017-04-02 ENCOUNTER — Emergency Department (HOSPITAL_BASED_OUTPATIENT_CLINIC_OR_DEPARTMENT_OTHER): Payer: 59

## 2017-04-02 ENCOUNTER — Encounter (HOSPITAL_COMMUNITY): Payer: Self-pay | Admitting: Emergency Medicine

## 2017-04-02 ENCOUNTER — Ambulatory Visit: Payer: Self-pay | Admitting: Nurse Practitioner

## 2017-04-02 ENCOUNTER — Ambulatory Visit: Payer: 59 | Admitting: Adult Health

## 2017-04-02 ENCOUNTER — Encounter: Payer: Self-pay | Admitting: Nurse Practitioner

## 2017-04-02 VITALS — BP 218/130 | HR 82 | Temp 98.4°F | Wt 184.6 lb

## 2017-04-02 DIAGNOSIS — R51 Headache: Secondary | ICD-10-CM | POA: Insufficient documentation

## 2017-04-02 DIAGNOSIS — J45909 Unspecified asthma, uncomplicated: Secondary | ICD-10-CM | POA: Insufficient documentation

## 2017-04-02 DIAGNOSIS — Z79899 Other long term (current) drug therapy: Secondary | ICD-10-CM | POA: Insufficient documentation

## 2017-04-02 DIAGNOSIS — I5031 Acute diastolic (congestive) heart failure: Secondary | ICD-10-CM | POA: Insufficient documentation

## 2017-04-02 DIAGNOSIS — Z5321 Procedure and treatment not carried out due to patient leaving prior to being seen by health care provider: Secondary | ICD-10-CM | POA: Insufficient documentation

## 2017-04-02 DIAGNOSIS — I10 Essential (primary) hypertension: Secondary | ICD-10-CM

## 2017-04-02 DIAGNOSIS — I110 Hypertensive heart disease with heart failure: Secondary | ICD-10-CM | POA: Insufficient documentation

## 2017-04-02 DIAGNOSIS — Z765 Malingerer [conscious simulation]: Secondary | ICD-10-CM

## 2017-04-02 DIAGNOSIS — Z7289 Other problems related to lifestyle: Secondary | ICD-10-CM | POA: Insufficient documentation

## 2017-04-02 DIAGNOSIS — I509 Heart failure, unspecified: Secondary | ICD-10-CM | POA: Insufficient documentation

## 2017-04-02 LAB — CBC WITH DIFFERENTIAL/PLATELET
Basophils Absolute: 0 10*3/uL (ref 0.0–0.1)
Basophils Relative: 0 %
Eosinophils Absolute: 0.1 10*3/uL (ref 0.0–0.7)
Eosinophils Relative: 2 %
HEMATOCRIT: 41.8 % (ref 36.0–46.0)
HEMOGLOBIN: 13.5 g/dL (ref 12.0–15.0)
Lymphocytes Relative: 31 %
Lymphs Abs: 2.7 10*3/uL (ref 0.7–4.0)
MCH: 29.3 pg (ref 26.0–34.0)
MCHC: 32.3 g/dL (ref 30.0–36.0)
MCV: 90.7 fL (ref 78.0–100.0)
Monocytes Absolute: 0.8 10*3/uL (ref 0.1–1.0)
Monocytes Relative: 9 %
NEUTROS ABS: 5.1 10*3/uL (ref 1.7–7.7)
NEUTROS PCT: 58 %
Platelets: 214 10*3/uL (ref 150–400)
RBC: 4.61 MIL/uL (ref 3.87–5.11)
RDW: 14.2 % (ref 11.5–15.5)
WBC: 8.8 10*3/uL (ref 4.0–10.5)

## 2017-04-02 LAB — BASIC METABOLIC PANEL
Anion gap: 9 (ref 5–15)
BUN: 20 mg/dL (ref 6–20)
CHLORIDE: 106 mmol/L (ref 101–111)
CO2: 28 mmol/L (ref 22–32)
Calcium: 8.5 mg/dL — ABNORMAL LOW (ref 8.9–10.3)
Creatinine, Ser: 1.02 mg/dL — ABNORMAL HIGH (ref 0.44–1.00)
GFR calc non Af Amer: >60 mL/min (ref >60)
Glucose, Bld: 104 mg/dL — ABNORMAL HIGH (ref 65–99)
Potassium: 3.6 mmol/L (ref 3.5–5.1)
Sodium: 143 mmol/L (ref 135–145)

## 2017-04-02 LAB — TROPONIN I: Troponin I: <0.03 ng/mL (ref <0.03)

## 2017-04-02 MED ORDER — ACETAMINOPHEN 325 MG PO TABS
650.0000 mg | ORAL_TABLET | Freq: Once | ORAL | Status: AC
  Administered 2017-04-02: 650 mg via ORAL

## 2017-04-02 MED ORDER — CARVEDILOL 6.25 MG PO TABS: 6.2500 mg | ORAL_TABLET | Freq: Two times a day (BID) | ORAL | 0 refills | Status: DC

## 2017-04-02 MED ORDER — HYDROCHLOROTHIAZIDE 25 MG PO TABS: 25.0000 mg | ORAL_TABLET | Freq: Every day | ORAL | 0 refills | Status: DC

## 2017-04-02 MED ORDER — METOPROLOL TARTRATE 5 MG/5ML IV SOLN
2.5000 mg | Freq: Once | INTRAVENOUS | Status: AC
  Administered 2017-04-02: 2.5 mg via INTRAVENOUS
  Filled 2017-04-02: qty 5

## 2017-04-02 MED ORDER — CARVEDILOL 6.25 MG PO TABS
6.2500 mg | ORAL_TABLET | Freq: Two times a day (BID) | ORAL | Status: DC
  Filled 2017-04-02: qty 1

## 2017-04-02 MED ORDER — HYDRALAZINE HCL 20 MG/ML IJ SOLN
5.0000 mg | Freq: Once | INTRAMUSCULAR | Status: AC
  Administered 2017-04-02: 5 mg via INTRAVENOUS
  Filled 2017-04-02: qty 1

## 2017-04-02 MED ORDER — HYDRALAZINE HCL 20 MG/ML IJ SOLN
2.0000 mg | Freq: Once | INTRAMUSCULAR | Status: AC
  Administered 2017-04-02: 2 mg via INTRAVENOUS
  Filled 2017-04-02: qty 1

## 2017-04-02 MED ORDER — AMLODIPINE BESYLATE 10 MG PO TABS: 10.0000 mg | ORAL_TABLET | Freq: Every day | ORAL | 0 refills | Status: DC

## 2017-04-02 MED ORDER — ACETAMINOPHEN 325 MG PO TABS
ORAL_TABLET | ORAL | Status: AC
  Filled 2017-04-02: qty 2

## 2017-04-02 MED ORDER — AMLODIPINE BESYLATE 5 MG PO TABS
10.0000 mg | ORAL_TABLET | Freq: Once | ORAL | Status: AC
  Administered 2017-04-02: 10 mg via ORAL
  Filled 2017-04-02: qty 2

## 2017-04-02 MED ORDER — HYDROCHLOROTHIAZIDE 25 MG PO TABS
25.0000 mg | ORAL_TABLET | Freq: Once | ORAL | Status: AC
  Administered 2017-04-02: 25 mg via ORAL
  Filled 2017-04-02: qty 1

## 2017-04-02 NOTE — ED Triage Notes (Signed)
Pt c/o headache since this morning with HTN, also "some chest pain."  She denies any SOB, denies dizziness, denies numbness, denies weakness, requesting refills of her meds also

## 2017-04-02 NOTE — ED Notes (Signed)
Pt ambulatory to restroom

## 2017-04-02 NOTE — Progress Notes (Addendum)
Patient presented for medication refill at [facility] for klonopin.  During triage, patient's BP was 218/130, CMA made writer aware.  Writer reviewed patient's chart and saw that patient was discharged from the ER this am with complaints of "needing a refill of her seizure medication and HA".  Patient was discharged from ER with 3 BP meds and told to f/u with [facility].  Spoke with patient regarding events in the ER.  Patient states they would not refill her klonopin because it was a controlled med.  Patient kept reiterating that she had not slept because she has been out of her seizure medication for at least two days.  Patient asked who had prescribed the medication and why she had not followed up.  Patient states she was dismissed from the practice because she did not receive her bill.  CMA, Keba, contacted Dr. Zebedee Iba office.  Contact at [facility] office stated patient had drug seeking tendencies and that her account as locked.  While sitting in room, patient then begans to c/o petit mal seizures.  Informed patient that she should go to the ER immediately.  Patient continued to ask for klonopin, but I informed her that her vital signs and complaints are not appropriate to be seen at [facility].  Again recommended that patient go to ER.  In the interim, CMA contacted [facility] and made an appointment for the patient tomorrow at 7:30am.  Patient continued to ask for klonopin and I told her I would not prescribe this medication because this is not a medication that is taken daily for seizure control.  Address and phone provided for [place].  Informed patient that she should take her BP meds if she hasn't, but recommended returning to the ER for BP, although chronic problem, and new c/o seizures.  Patient refunded money and was not seen.  Patient ambulatory when leaving.

## 2017-04-02 NOTE — ED Notes (Signed)
Pt to desk to ask wait time. Pt updated on wait and stated she was leaving.

## 2017-04-02 NOTE — ED Triage Notes (Signed)
Pt arrives to ED with headache 10/10 and hypertension. Pt was seen at med center last night for same and had CT scan. Pt is alert and ox4. Denies any dizziness or numbness. NO sob or chest pain.

## 2017-04-02 NOTE — ED Notes (Signed)
Pt verbalizes understanding of d/c instructions and denies any further needs at this time. 

## 2017-04-02 NOTE — ED Provider Notes (Addendum)
[facility]   CSN: [id number] Arrival date & time: 04/02/17  0002     History   Chief Complaint Chief Complaint  Patient presents with  . Headache    HPI Mikayla Sweeney is a 56 y.o. female.  The history is provided by the patient.  Hypertension  This is a chronic problem. The current episode started more than 1 week ago. The problem occurs constantly. The problem has not changed since onset.Pertinent negatives include no chest pain, no abdominal pain, no headaches and no shortness of breath. Nothing aggravates the symptoms. Nothing relieves the symptoms. Treatments tried: has only intermittently been taking medications secondary to loss of insurance.   The treatment provided no relief.  Patient denied chest pain shortness of breath, headache (to me) weakness numbness or changes in vision or speech.  She states she takes BP medications but has missed doses.  She denies bipolar disorder and states she cannot follow up with her PMD for medication refills as she owes them money and they will not see her.  She states she needs refill on her BP meds.    Past Medical History:  Diagnosis Date  . Asthma   . CHF (congestive heart failure) (HCC)   . Epilepsy (HCC)   . Hypertension   . Sarcoidosis     Patient Active Problem List   Diagnosis Date Noted  . Acute diastolic heart failure (HCC) 07/27/2014  . Acute respiratory failure with hypoxia (HCC) 07/26/2014  . Pulmonary edema 07/26/2014  . Benign essential HTN 07/26/2014  . Sarcoidosis 07/26/2014  . Bipolar 1 disorder (HCC) 07/26/2014  . Headache 07/26/2014    Past Surgical History:  Procedure Laterality Date  . TONSILLECTOMY    . TUBAL LIGATION      OB History    No data available       Home Medications    Prior to Admission medications   Medication Sig Start Date End Date Taking? Authorizing Provider  albuterol (PROVENTIL HFA;VENTOLIN HFA) 108 (90 BASE) MCG/ACT inhaler Inhale 2 puffs into  the lungs every 6 (six) hours as needed. For wheeze or shortness of breath    Historical Provider, MD  amLODipine (NORVASC) 2.5 MG tablet Take 1 tablet (2.5 mg total) by mouth daily. 03/01/17   Deatra Canter, FNP  aspirin 325 MG EC tablet Take 1 tablet (325 mg total) by mouth daily. 09/02/14   Lewayne Bunting, MD  carvedilol (COREG) 6.25 MG tablet Take 1 tablet (6.25 mg total) by mouth 2 (two) times daily with a meal. 03/01/17   Deatra Canter, FNP  cephALEXin (KEFLEX) 500 MG capsule Take 1 capsule (500 mg total) by mouth 3 (three) times daily. 12/25/14   John Molpus, MD  clonazePAM (KLONOPIN) 1 MG tablet Take 1 tablet (1 mg total) by mouth at bedtime. 03/01/17   Deatra Canter, FNP  cyclobenzaprine (FLEXERIL) 10 MG tablet Take 1 tablet (10 mg total) by mouth 3 (three) times daily as needed for muscle spasms. 12/25/14   John Molpus, MD  fluconazole (DIFLUCAN) 150 MG tablet Take 1 tablet as needed for vaginal yeast infection. May repeat in 3 days if necessary. 12/25/14   John Molpus, MD  furosemide (LASIX) 20 MG tablet Take 1 tablet (20 mg total) by mouth daily. 03/01/17   Deatra Canter, FNP  losartan (COZAAR) 100 MG tablet Take 1 tablet (100 mg total) by mouth daily. 03/01/17   Deatra Canter, FNP  Multiple Vitamin (MULTIVITAMIN WITH  MINERALS) TABS Take 1 tablet by mouth daily.    Historical Provider, MD  ondansetron (ZOFRAN ODT) 8 MG disintegrating tablet Take 1 tablet (8 mg total) by mouth every 8 (eight) hours as needed for nausea or vomiting. 12/25/14   John Molpus, MD  phenazopyridine (PYRIDIUM) 200 MG tablet Take 1 tablet (200 mg total) by mouth 3 (three) times daily. 03/01/17   Deatra Canter, FNP  traMADol (ULTRAM) 50 MG tablet Take 1 tablet (50 mg total) by mouth every 6 (six) hours as needed for severe pain. 07/28/14   Kela Millin, MD    Family History Family History  Problem Relation Age of Onset  . Pancreatic cancer Mother   . Hypertension Mother   . Diabetes Mother   .  Heart disease Father   . Gout Father   . Cirrhosis Father   . Heart disease Sister     CHF    Social History Social History  Substance Use Topics  . Smoking status: Never Smoker  . Smokeless tobacco: Never Used  . Alcohol use No     Allergies   Patient has no known allergies.   Review of Systems Review of Systems  Constitutional: Negative for fever.  Respiratory: Negative for shortness of breath and wheezing.   Cardiovascular: Negative for chest pain, palpitations and leg swelling.  Gastrointestinal: Negative for abdominal pain.  Neurological: Negative for dizziness, tremors, seizures, syncope, facial asymmetry, speech difficulty, weakness, light-headedness, numbness and headaches.  All other systems reviewed and are negative.    Physical Exam Updated Vital Signs BP (!) 220/110 (BP Location: Right Arm)   Pulse 70   Temp 98.2 F (36.8 C) (Oral)   Resp 18   Ht  (1.6 m)   Wt 183 lb (83 kg)   SpO2 100%   BMI 32.42 kg/m   Physical Exam  Constitutional: She is oriented to person, place, and time. She appears well-developed and well-nourished. No distress.  HENT:  Head: Normocephalic and atraumatic.  Mouth/Throat: No oropharyngeal exudate.  Eyes: Conjunctivae and EOM are normal. Pupils are equal, round, and reactive to light.  Neck: Normal range of motion. Neck supple.  Cardiovascular: Normal rate, regular rhythm, normal heart sounds and intact distal pulses.   Pulmonary/Chest: Effort normal and breath sounds normal. She has no wheezes. She has no rales.  Abdominal: Soft. Bowel sounds are normal. She exhibits no mass. There is no tenderness. There is no rebound and no guarding.  Musculoskeletal: Normal range of motion.  Neurological: She is alert and oriented to person, place, and time. She displays normal reflexes.  Skin: Skin is warm and dry. Capillary refill takes less than 2 seconds.  Psychiatric: She has a normal mood and affect.     ED Treatments /  Results   Vitals:   04/02/17 0331 04/02/17 0400  BP: (!) 204/95 (!) 196/101  Pulse: 65 65  Resp: 18 18  Temp:      Labs (all labs ordered are listed, but only abnormal results are displayed) Results for orders placed or performed during the hospital encounter of 04/02/17  CBC with Differential/Platelet  Result Value Ref Range   WBC 8.8 4.0 - 10.5 K/uL   RBC 4.61 3.87 - 5.11 MIL/uL   Hemoglobin 13.5 12.0 - 15.0 g/dL   HCT 04.5 40.9 - 81.1 %   MCV 90.7 78.0 - 100.0 fL   MCH 29.3 26.0 - 34.0 pg   MCHC 32.3 30.0 - 36.0 g/dL   RDW 91.4  11.5 - 15.5 %   Platelets 214 150 - 400 K/uL   Neutrophils Relative % 58 %   Neutro Abs 5.1 1.7 - 7.7 K/uL   Lymphocytes Relative 31 %   Lymphs Abs 2.7 0.7 - 4.0 K/uL   Monocytes Relative 9 %   Monocytes Absolute 0.8 0.1 - 1.0 K/uL   Eosinophils Relative 2 %   Eosinophils Absolute 0.1 0.0 - 0.7 K/uL   Basophils Relative 0 %   Basophils Absolute 0.0 0.0 - 0.1 K/uL  Basic metabolic panel  Result Value Ref Range   Sodium 143 135 - 145 mmol/L   Potassium 3.6 3.5 - 5.1 mmol/L   Chloride 106 101 - 111 mmol/L   CO2 28 22 - 32 mmol/L   Glucose, Bld 104 (H) 65 - 99 mg/dL   BUN 20 6 - 20 mg/dL   Creatinine, Ser 1.61 (H) 0.44 - 1.00 mg/dL   Calcium 8.5 (L) 8.9 - 10.3 mg/dL   GFR calc non Af Amer >60 >60 mL/min   GFR calc Af Amer >60 >60 mL/min   Anion gap 9 5 - 15  Troponin I  Result Value Ref Range   Troponin I <0.03 <0.03 ng/mL   Dg Chest 2 View  Result Date: 04/02/2017 CLINICAL DATA:  Left-sided chest pain and weakness EXAM: CHEST  2 VIEW COMPARISON:  Chest radiograph 10/10/2014 FINDINGS: The heart size and mediastinal contours are within normal limits. Both lungs are clear. The visualized skeletal structures are unremarkable. IMPRESSION: No active cardiopulmonary disease. Electronically Signed   By: Deatra Robinson M.D.   On: 04/02/2017 00:59   Ct Head Wo Contrast  Result Date: 04/02/2017 CLINICAL DATA:  56 year old female with hypertension  and headache. EXAM: CT HEAD WITHOUT CONTRAST TECHNIQUE: Contiguous axial images were obtained from the base of the skull through the vertex without intravenous contrast. COMPARISON:  None. FINDINGS: Brain: The ventricles and sulci appropriate size for patient's age. Mild periventricular and deep white matter chronic microvascular ischemic changes noted. There is no acute intracranial hemorrhage. No mass effect or midline shift noted. No extra-axial fluid collection. Vascular: No hyperdense vessel or unexpected calcification. Skull: Normal. Negative for fracture or focal lesion. Sinuses/Orbits: No acute finding. Other: None IMPRESSION: 1. No acute intracranial hemorrhage. 2. Mild chronic microvascular ischemic changes. Electronically Signed   By: Elgie Collard M.D.   On: 04/02/2017 04:03    EKG  EKG Interpretation  Date/Time:  Tuesday Abad Manard 24 2018 00:13:38 EDT Ventricular Rate:  69 PR Interval:  168 QRS Duration: 96 QT Interval:  400 QTC Calculation: 428 R Axis:   48 Text Interpretation:  Normal sinus rhythm Left ventricular hypertrophy with repolarization abnormality Confirmed by [facility]  MD, Destin Kittler ([id number]) on 04/02/2017 12:31:05 AM       Radiology Dg Chest 2 View  Result Date: 04/02/2017 CLINICAL DATA:  Left-sided chest pain and weakness EXAM: CHEST  2 VIEW COMPARISON:  Chest radiograph 10/10/2014 FINDINGS: The heart size and mediastinal contours are within normal limits. Both lungs are clear. The visualized skeletal structures are unremarkable. IMPRESSION: No active cardiopulmonary disease. Electronically Signed   By: Deatra Robinson M.D.   On: 04/02/2017 00:59    Procedures Procedures (including critical care time)  Medications Ordered in ED Medications  amLODipine (NORVASC) tablet 10 mg (not administered)  carvedilol (COREG) tablet 6.25 mg (not administered)     03/01/2017 03/01/2017 CLONAZEPAM 1 MG TABLET [id number] 30 30 0 0 [id number] [place] Anselm Pancoast NP [place],  [place] [id number] [place]  [facility] [place], [place], Lilyanah J May 23, 1961 [address] [place], [place] [address] 01 UNK 01/16/2017 07/27/2016 CLONAZEPAM 1 MG TABLET [id number] 45 [id number] Mack Hook MD [place], [place] [id number] [place] [facility] [place], [place] Stonesifer, Jasie J 1961/06/27 [address] [place], [place] [address] 01 UNK 12/05/2016 07/27/2016 CLONAZEPAM 1 MG TABLET [id number] 45 [id number] Mack Hook MD [place], [place] [id number] [place] [facility] [place], [place] Guarino, Najwa J [birth year] [address] [place], [place] [address] 03 UNK 10/12/2016 07/27/2016 CLONAZEPAM 1 MG TABLET [id number] 45 [id number] Mack Hook MD [place], [place] [id number] [place] [facility] [place], [place] Delcid, Ameena J [birth year] [address] [place], [place] [address] 03 UNK 09/07/2016 07/27/2016 CLONAZEPAM 1 MG TABLET [id number] 45 [id number] Mack Hook MD [place], [place] [id number] [place] [facility] [place], [place] Carreira, Theresia J 1961/10/18 [address] [place], [place] [address] 03 UNK 07/27/2016 07/27/2016 CLONAZEPAM 1 MG TABLET [id number] 45 30 0 5 [id number] MACKENZIE BRIAN J MD [place], [place] [id number] [place] [facility] [place], [place] Pittman, Haset J [birth year] [address] [place], [place] [address] 03 UNK 06/07/2016 06/07/2016 CLONAZEPAM 1 MG TABLET [id number] 45 30 0 0 [id number] MACKENZIE BRIAN J MD [place], [place] [id number]  [facility] [place], [place] Dorin, Laron J 07-May-1961 [address] [place], [place] [address] 03 UNK 04/23/2016 04/23/2016 CLONAZEPAM 1 MG TABLET [id number] 45 30  Final Clinical Impressions(s) / ED Diagnoses  Chronic HTN:  Will give a 30 days supply of hypertension medications and have patient follow up with community health and  wellness if she is no longer seeing Dr. Thea Silversmith.  I believe that the patient is not taking medication.  Patient was angry that we were not refilling her narcotic medication or benzodiazepine, this was informed to me by the patient's nurse.  EDP immediately went to speak to patient and politely stated we do not refill these medications as it is a department and state policy.  Then she stated she only received 2 RX and EDP counted them for her and there are 3 BP medications.  Then she attempted to get an RX stating she has a seizure disorder and that this is what they use for her seizures. She states she cannot take other medications for same.  She says this is what her doctors have always used and someone else has refilled them.  EDP stated this is not a medication that we use for seizures and seizures were not listed in her past medical history anywhere.  EDP profusely apologized for the inconvenience but stated she would need to see a family physician for these medications and that we did both increase her BP medications but have provided her with 30 days worth of medications to get her through until she can be seen by a new family doctor.  [facility] info was placed on her discharge papers.    Well appearing. Return for chest pain, shortness of breath, weakness, numbness, changes in vision or speech, neck pain, or any concerns. Patient is extremely well appearing and exam and imaging are negative for acute finding.   After history, exam, and medical workup I feel the patient has been appropriately medically screened and is safe for discharge home. Pertinent diagnoses were discussed with the patient. Patient was given return precautions.     Cy Blamer, MD 04/02/17 [phone]

## 2017-04-02 NOTE — ED Notes (Signed)
Pt asking about her prescription for klonopin.  Informed pt with EDP that klonopin is not a prescription that is refilled in the ED.  Dr. Nicanor Alcon explained to pt that she can follow up with [facility] and Wellness, and pt has still received refills on her three BP meds.

## 2017-04-03 ENCOUNTER — Encounter: Payer: Self-pay | Admitting: Adult Health

## 2017-04-03 ENCOUNTER — Ambulatory Visit: Payer: Self-pay | Admitting: Adult Health

## 2017-04-03 ENCOUNTER — Ambulatory Visit (INDEPENDENT_AMBULATORY_CARE_PROVIDER_SITE_OTHER): Payer: Self-pay | Admitting: Adult Health

## 2017-04-03 VITALS — BP 178/100 | Ht 63.0 in | Wt 185.1 lb

## 2017-04-03 DIAGNOSIS — G40919 Epilepsy, unspecified, intractable, without status epilepticus: Secondary | ICD-10-CM

## 2017-04-03 DIAGNOSIS — Z765 Malingerer [conscious simulation]: Secondary | ICD-10-CM

## 2017-04-03 DIAGNOSIS — Z7689 Persons encountering health services in other specified circumstances: Secondary | ICD-10-CM

## 2017-04-03 DIAGNOSIS — I10 Essential (primary) hypertension: Secondary | ICD-10-CM

## 2017-04-03 MED ORDER — CLONAZEPAM 1 MG PO TABS: ORAL_TABLET | ORAL | 0 refills | Status: DC

## 2017-04-03 NOTE — Patient Instructions (Signed)
It was great meeting you today   Someone will give you a call from Neurology   I have prescribed you a one time prescription for Klonopin - take one half tab every day as needed, try to stretch it out past one day   Follow up with me in 2 weeks or sooner if needed

## 2017-04-03 NOTE — Progress Notes (Signed)
Patient presents to clinic today to establish care. She is pleasant 56 year old female who  has a past medical history of Asthma; CHF (congestive heart failure) ([place]); Epilepsy ([place]); Hypertension; and Sarcoidosis.   Acute Concerns: Establish Care  Chronic Issues: CHF - Does not follow up with Cardiology. She reports being controlled with Lasix   HTN - Amlodipine 10 mg, HCTZ 25 mg, Cozaar 100 mg. she reports taking amlodipine and Cozaar daily. She was just started on hydrochlorothiazide and has not picked up from the pharmacy yet  Epilepsy- states she's been taking Klonopin 1 mg to "control my seizures".  Drug seeking Behavior - there is concern for drug-seeking behavior with this patient especially with Klonopin. She was seen in the emergency room on 04/02/2017 for hypertension. Her blood pressure medications were refilled the patient became upset that the ER would not refill her Klonopin. She then went to [facility] or she was again denied a Klonopin prescription. Per Leodis Binet note "Contact at [facility] office stated patient had drug seeking tendencies and that her account as locked.  While sitting in room, patient then begans to c/o petit mal seizures" she was directed to the emergency room where she went but left without being seen.  I told the patient that Klonopin was not used daily for seizure control and that I would write her a one-time prescription so that she would not go through withdrawal. She is to have follow-up with neurology to be evaluated for seizures is given To follow-up with neurology to be evaluated for seizures. Since she was complaining of seizures while it [facility] she is not to drive until she is seen.   Health Maintenance: Dental -- Does not do routine care Vision -- Does not do routine care Immunizations -- UTD  Colonoscopy -- UTD Mammogram -- Greater than one year PAP -- Over one year   Diet: Does not eat healthy  Exercise: Does not exercise   Past  Medical History:  Diagnosis Date  . Asthma   . CHF (congestive heart failure) ([place])   . Epilepsy ([place])   . Hypertension   . Sarcoidosis     Past Surgical History:  Procedure Laterality Date  . TONSILLECTOMY    . TUBAL LIGATION      Current Outpatient Prescriptions on File Prior to Visit  Medication Sig Dispense Refill  . albuterol (PROVENTIL HFA;VENTOLIN HFA) 108 (90 BASE) MCG/ACT inhaler Inhale 2 puffs into the lungs every 6 (six) hours as needed. For wheeze or shortness of breath    . amLODipine (NORVASC) 10 MG tablet Take 1 tablet (10 mg total) by mouth daily. 30 tablet 0  . carvedilol (COREG) 6.25 MG tablet Take 1 tablet (6.25 mg total) by mouth 2 (two) times daily with a meal. 60 tablet 0  . clonazePAM (KLONOPIN) 1 MG tablet Take 1 tablet (1 mg total) by mouth at bedtime. 30 tablet 0  . furosemide (LASIX) 20 MG tablet Take 1 tablet (20 mg total) by mouth daily. 30 tablet 0  . hydrochlorothiazide (HYDRODIURIL) 25 MG tablet Take 1 tablet (25 mg total) by mouth daily. 30 tablet 0  . losartan (COZAAR) 100 MG tablet Take 1 tablet (100 mg total) by mouth daily. 30 tablet 0  . ondansetron (ZOFRAN ODT) 8 MG disintegrating tablet Take 1 tablet (8 mg total) by mouth every 8 (eight) hours as needed for nausea or vomiting. 10 tablet 0  . phenazopyridine (PYRIDIUM) 200 MG tablet Take 1 tablet (200 mg total) by  mouth 3 (three) times daily. 6 tablet 0  . [DISCONTINUED] lisinopril (PRINIVIL,ZESTRIL) 10 MG tablet Take 1 tablet (10 mg total) by mouth daily. 30 tablet 0  . [DISCONTINUED] loratadine (CLARITIN) 10 MG tablet Take 1 tablet (10 mg total) by mouth daily. 30 tablet 0  . [DISCONTINUED] QUEtiapine (SEROQUEL XR) 400 MG 24 hr tablet Take 400 mg by mouth at bedtime.    . [DISCONTINUED] topiramate (TOPAMAX) 200 MG tablet Take 600 mg by mouth at bedtime.     No current facility-administered medications on file prior to visit.     No Known Allergies  Family History  Problem Relation Age of  Onset  . Pancreatic cancer Mother   . Hypertension Mother   . Diabetes Mother   . Heart disease Father   . Gout Father   . Cirrhosis Father   . Heart disease Sister     CHF    Social History   Social History  . Marital status: Married    Spouse name: N/A  . Number of children: N/A  . Years of education: N/A   Occupational History  . Not on file.   Social History Main Topics  . Smoking status: Never Smoker  . Smokeless tobacco: Never Used  . Alcohol use No  . Drug use: No  . Sexual activity: Not on file   Other Topics Concern  . Not on file   Social History Narrative  . No narrative on file    Review of Systems  Constitutional: Negative.   HENT: Negative.   Eyes: Negative.   Respiratory: Negative.   Cardiovascular: Negative.   Gastrointestinal: Negative.   Genitourinary: Negative.   Musculoskeletal: Negative.   Skin: Negative.   Neurological: Negative.   Endo/Heme/Allergies: Negative.   Psychiatric/Behavioral: Negative.   All other systems reviewed and are negative.   BP (!) 178/100 (BP Location: Left Arm, Patient Position: Sitting, Cuff Size: Normal)   Ht '5\' 3"'  (1.6 m)   Wt 185 lb 1.6 oz (84 kg)   BMI 32.79 kg/m   Physical Exam  Constitutional: She is oriented to person, place, and time and well-developed, well-nourished, and in no distress. No distress.  Eyes: Conjunctivae and EOM are normal. Pupils are equal, round, and reactive to light. Right eye exhibits no discharge. Left eye exhibits no discharge. No scleral icterus.  Neck: Normal range of motion. Neck supple. No thyromegaly present.  Cardiovascular: Normal rate, regular rhythm, normal heart sounds and intact distal pulses.  Exam reveals no gallop and no friction rub.   No murmur heard. Pulmonary/Chest: Effort normal and breath sounds normal. No respiratory distress. She has no wheezes. She has no rales. She exhibits no tenderness.  Lymphadenopathy:    She has no cervical adenopathy.    Neurological: She is alert and oriented to person, place, and time. She has normal reflexes. Gait normal. GCS score is 15.  Skin: Skin is warm and dry. No rash noted. She is not diaphoretic. No erythema. No pallor.  Psychiatric: Mood, memory, affect and judgment normal.  Nursing note and vitals reviewed.   Recent Results (from the past 2160 hour(s))  POCT urinalysis dip (device)     Status: Abnormal   Collection Time: 03/01/17  1:44 PM  Result Value Ref Range   Glucose, UA NEGATIVE NEGATIVE mg/dL   Bilirubin Urine SMALL (A) NEGATIVE   Ketones, ur 15 (A) NEGATIVE mg/dL   Specific Gravity, Urine 1.025 1.005 - 1.030   Hgb urine dipstick NEGATIVE NEGATIVE  pH 6.5 5.0 - 8.0   Protein, ur 100 (A) NEGATIVE mg/dL   Urobilinogen, UA 0.2 0.0 - 1.0 mg/dL   Nitrite NEGATIVE NEGATIVE   Leukocytes, UA NEGATIVE NEGATIVE    Comment: Biochemical Testing Only. Please order routine urinalysis from main lab if confirmatory testing is needed.  CBC with Differential/Platelet     Status: None   Collection Time: 04/02/17  2:29 AM  Result Value Ref Range   WBC 8.8 4.0 - 10.5 K/uL   RBC 4.61 3.87 - 5.11 MIL/uL   Hemoglobin 13.5 12.0 - 15.0 g/dL   HCT 41.8 36.0 - 46.0 %   MCV 90.7 78.0 - 100.0 fL   MCH 29.3 26.0 - 34.0 pg   MCHC 32.3 30.0 - 36.0 g/dL   RDW 14.2 11.5 - 15.5 %   Platelets 214 150 - 400 K/uL   Neutrophils Relative % 58 %   Neutro Abs 5.1 1.7 - 7.7 K/uL   Lymphocytes Relative 31 %   Lymphs Abs 2.7 0.7 - 4.0 K/uL   Monocytes Relative 9 %   Monocytes Absolute 0.8 0.1 - 1.0 K/uL   Eosinophils Relative 2 %   Eosinophils Absolute 0.1 0.0 - 0.7 K/uL   Basophils Relative 0 %   Basophils Absolute 0.0 0.0 - 0.1 K/uL  Basic metabolic panel     Status: Abnormal   Collection Time: 04/02/17  2:29 AM  Result Value Ref Range   Sodium 143 135 - 145 mmol/L   Potassium 3.6 3.5 - 5.1 mmol/L   Chloride 106 101 - 111 mmol/L   CO2 28 22 - 32 mmol/L   Glucose, Bld 104 (H) 65 - 99 mg/dL   BUN 20 6 -  20 mg/dL   Creatinine, Ser 1.02 (H) 0.44 - 1.00 mg/dL   Calcium 8.5 (L) 8.9 - 10.3 mg/dL   GFR calc non Af Amer >60 >60 mL/min   GFR calc Af Amer >60 >60 mL/min    Comment: (NOTE) The eGFR has been calculated using the CKD EPI equation. This calculation has not been validated in all clinical situations. eGFR's persistently <60 mL/min signify possible Chronic Kidney Disease.    Anion gap 9 5 - 15  Troponin I     Status: None   Collection Time: 04/02/17  2:29 AM  Result Value Ref Range   Troponin I <0.03 <0.03 ng/mL    Assessment/Plan:  1. Encounter to establish care - Follow-up for complete physical exam - I will get records from Dr. Thressa Sheller - Needs to work on diet and exercise   2. Essential hypertension - Advised her to take all medications as directed. She needs to pick up her hydrochlorothiazide. I would like to see her back in 2 weeks after starting this medication - We'll likely go up on Coreg at next visit  3. Intractable epilepsy without status epilepticus, unspecified epilepsy type ([place])  - clonazePAM (KLONOPIN) 1 MG tablet; Take 1/2 tab as daily as needed  Dispense: 30 tablet; Refill: 0 - Ambulatory referral to Neurology  4. Drug-seeking behavior - It was reiterated to Russie before she left that this was a one-time prescription for Klonopin. She is to take one half of a tab daily as needed but was advised to spread the duration out between when she takes this medication as is a one-time prescription. - I will get medical records from PCP  Dorothyann Peng, NP

## 2017-05-02 ENCOUNTER — Ambulatory Visit: Payer: Self-pay | Admitting: Adult Health

## 2017-05-07 ENCOUNTER — Ambulatory Visit: Payer: Self-pay | Admitting: Adult Health

## 2017-05-09 ENCOUNTER — Ambulatory Visit (INDEPENDENT_AMBULATORY_CARE_PROVIDER_SITE_OTHER): Payer: Self-pay | Admitting: Adult Health

## 2017-05-09 ENCOUNTER — Encounter: Payer: Self-pay | Admitting: Adult Health

## 2017-05-09 VITALS — BP 124/78 | Temp 98.6°F | Ht 63.0 in | Wt 183.7 lb

## 2017-05-09 DIAGNOSIS — I10 Essential (primary) hypertension: Secondary | ICD-10-CM

## 2017-05-09 LAB — BASIC METABOLIC PANEL
BUN: 16 mg/dL (ref 6–23)
CALCIUM: 9.1 mg/dL (ref 8.4–10.5)
CHLORIDE: 102 meq/L (ref 96–112)
CO2: 33 meq/L — AB (ref 19–32)
CREATININE: 0.95 mg/dL (ref 0.40–1.20)
GFR: 78.24 mL/min (ref >60.00)
Glucose, Bld: 102 mg/dL — ABNORMAL HIGH (ref 70–99)
Potassium: 4 mEq/L (ref 3.5–5.1)
SODIUM: 140 meq/L (ref 135–145)

## 2017-05-09 NOTE — Progress Notes (Signed)
Subjective:    Patient ID: Mikayla Sweeney, female    DOB: 1961-01-08, 56 y.o.   MRN: [id number]  HPI  56 year old female who  has a past medical history of Anxiety; Asthma; B12 deficiency; Bipolar 1 disorder ([facility]); CHF (congestive heart failure) (H[facility]); Epilepsy ([facility]); Hypertension; and Sarcoidosis.   She presents to the office today for follow up regarding hypertension. I last saw her one month ago after she was seen in the ER for hypertension. She was started on HCTZ 25 mg by the ER in addition to her already prescribed Amlodipine 10 mg and Cozaar 100mg    She reports that her blood pressures have been in the 120's/80's systolic.   She has not noticed any side effects of HCTZ   Overall, she feels " really good"   Review of Systems See HPI   Past Medical History:  Diagnosis Date  . Anxiety   . Asthma   . B12 deficiency   . Bipolar 1 disorder ([facility])   . CHF (congestive heart failure) ([facility])   . Epilepsy ([facility])   . Hypertension   . Sarcoidosis     Social History   Social History  . Marital status: Married    Spouse name: N/A  . Number of children: N/A  . Years of education: N/A   Occupational History  . Not on file.   Social History Main Topics  . Smoking status: Never Smoker  . Smokeless tobacco: Never Used  . Alcohol use No  . Drug use: No  . Sexual activity: Not on file   Other Topics Concern  . Not on file   Social History Narrative   She has her own business    Married    Three children    5 grandchildren       She likes to spend time with her grandchildren        Past Surgical History:  Procedure Laterality Date  . TONSILLECTOMY    . TUBAL LIGATION      Family History  Problem Relation Age of Onset  . Pancreatic cancer Mother   . Hypertension Mother   . Diabetes Mother   . Heart disease Father   . Gout Father   . Cirrhosis Father   . Heart disease Sister        CHF    No Known Allergies  Current Outpatient Prescriptions on File Prior  to Visit  Medication Sig Dispense Refill  . albuterol (PROVENTIL HFA;VENTOLIN HFA) 108 (90 BASE) MCG/ACT inhaler Inhale 2 puffs into the lungs every 6 (six) hours as needed. For wheeze or shortness of breath    . amLODipine (NORVASC) 10 MG tablet Take 1 tablet (10 mg total) by mouth daily. 30 tablet 0  . carvedilol (COREG) 6.25 MG tablet Take 1 tablet (6.25 mg total) by mouth 2 (two) times daily with a meal. 60 tablet 0  . clonazePAM (KLONOPIN) 1 MG tablet Take 1/2 tab as daily as needed 30 tablet 0  . furosemide (LASIX) 20 MG tablet Take 1 tablet (20 mg total) by mouth daily. 30 tablet 0  . hydrochlorothiazide (HYDRODIURIL) 25 MG tablet Take 1 tablet (25 mg total) by mouth daily. 30 tablet 0  . losartan (COZAAR) 100 MG tablet Take 1 tablet (100 mg total) by mouth daily. 30 tablet 0  . ondansetron (ZOFRAN ODT) 8 MG disintegrating tablet Take 1 tablet (8 mg total) by mouth every 8 (eight) hours as needed for nausea or vomiting. 10  tablet 0  . phenazopyridine (PYRIDIUM) 200 MG tablet Take 1 tablet (200 mg total) by mouth 3 (three) times daily. 6 tablet 0  . [DISCONTINUED] lisinopril (PRINIVIL,ZESTRIL) 10 MG tablet Take 1 tablet (10 mg total) by mouth daily. 30 tablet 0  . [DISCONTINUED] loratadine (CLARITIN) 10 MG tablet Take 1 tablet (10 mg total) by mouth daily. 30 tablet 0  . [DISCONTINUED] QUEtiapine (SEROQUEL XR) 400 MG 24 hr tablet Take 400 mg by mouth at bedtime.    . [DISCONTINUED] topiramate (TOPAMAX) 200 MG tablet Take 600 mg by mouth at bedtime.     No current facility-administered medications on file prior to visit.     BP 124/78 (BP Location: Left Arm, Patient Position: Sitting, Cuff Size: Normal)   Temp 98.6 F (37 C) (Oral)   Ht 5\' 3"  (1.6 m)   Wt 183 lb 11.2 oz (83.3 kg)   BMI 32.54 kg/m       Objective:   Physical Exam  Constitutional: She is oriented to person, place, and time. She appears well-developed and well-nourished. No distress.  Cardiovascular: Normal rate,  regular rhythm, normal heart sounds and intact distal pulses.  Exam reveals no friction rub.   No murmur heard. Neurological: She is alert and oriented to person, place, and time.  Skin: Skin is warm and dry. No rash noted. She is not diaphoretic. No erythema. No pallor.  Psychiatric: She has a normal mood and affect. Her behavior is normal. Judgment and thought content normal.  Nursing note and vitals reviewed.     Assessment & Plan:  1. Essential hypertension - Well controlled at this point. No changes in medication  - Basic Metabolic Panel - Follow up in July for CPE or sooner with any acute issues  Shirline Frees, NP

## 2017-05-27 ENCOUNTER — Encounter: Payer: Self-pay | Admitting: Neurology

## 2017-05-27 ENCOUNTER — Telehealth: Payer: Self-pay | Admitting: Adult Health

## 2017-05-27 NOTE — Telephone Encounter (Signed)
Pt request refill  clonazePAM (KLONOPIN) 1 MG tablet  Pt states she will make appt with neurology, but is out of this med and would like a 30 day to get her through to an appt.  [facility] [address]  (pls note new pharmacy)

## 2017-05-28 ENCOUNTER — Other Ambulatory Visit: Payer: Self-pay

## 2017-05-28 DIAGNOSIS — G40919 Epilepsy, unspecified, intractable, without status epilepticus: Secondary | ICD-10-CM

## 2017-05-28 MED ORDER — CLONAZEPAM 1 MG PO TABS: ORAL_TABLET | ORAL | 0 refills | Status: DC

## 2017-05-28 NOTE — Telephone Encounter (Signed)
Is this ok?

## 2017-05-28 NOTE — Telephone Encounter (Signed)
We spoke about this when she established care. She can have one additional refill but there will be no more provided after this

## 2017-05-29 NOTE — Telephone Encounter (Signed)
Rx has been refilled. Patient notified that no further refills will be given.

## 2017-06-01 ENCOUNTER — Other Ambulatory Visit: Payer: Self-pay | Admitting: Adult Health

## 2017-06-04 NOTE — Telephone Encounter (Signed)
She should be on Norvasc 10 mg   Please refuse this medication

## 2017-06-04 NOTE — Telephone Encounter (Signed)
Ok to refill 

## 2017-07-02 ENCOUNTER — Encounter: Payer: Self-pay | Admitting: Adult Health

## 2017-07-02 DIAGNOSIS — Z0289 Encounter for other administrative examinations: Secondary | ICD-10-CM

## 2017-07-10 ENCOUNTER — Ambulatory Visit: Payer: Self-pay | Admitting: Adult Health

## 2017-07-10 DIAGNOSIS — Z0289 Encounter for other administrative examinations: Secondary | ICD-10-CM

## 2017-07-11 ENCOUNTER — Ambulatory Visit (INDEPENDENT_AMBULATORY_CARE_PROVIDER_SITE_OTHER): Payer: Medicare HMO | Admitting: Adult Health

## 2017-07-11 VITALS — BP 160/92 | Temp 98.1°F | Wt 182.0 lb

## 2017-07-11 DIAGNOSIS — I10 Essential (primary) hypertension: Secondary | ICD-10-CM | POA: Diagnosis not present

## 2017-07-11 DIAGNOSIS — R110 Nausea: Secondary | ICD-10-CM

## 2017-07-11 MED ORDER — AMLODIPINE BESYLATE 10 MG PO TABS: 10.0000 mg | ORAL_TABLET | Freq: Every day | ORAL | 1 refills | Status: DC

## 2017-07-11 MED ORDER — ONDANSETRON 8 MG PO TBDP: 8.0000 mg | ORAL_TABLET | Freq: Three times a day (TID) | ORAL | 0 refills | Status: DC | PRN

## 2017-07-11 MED ORDER — CLONIDINE HCL 0.1 MG PO TABS: 0.1000 mg | ORAL_TABLET | Freq: Once | ORAL | Status: DC

## 2017-07-11 MED ORDER — LOSARTAN POTASSIUM 100 MG PO TABS: 100.0000 mg | ORAL_TABLET | Freq: Every day | ORAL | 1 refills | Status: DC

## 2017-07-11 MED ORDER — CARVEDILOL 6.25 MG PO TABS: 6.2500 mg | ORAL_TABLET | Freq: Two times a day (BID) | ORAL | 1 refills | Status: DC

## 2017-07-11 MED ORDER — FUROSEMIDE 20 MG PO TABS: 20.0000 mg | ORAL_TABLET | Freq: Every day | ORAL | 1 refills | Status: DC

## 2017-07-11 MED ORDER — HYDROCHLOROTHIAZIDE 25 MG PO TABS: 25.0000 mg | ORAL_TABLET | Freq: Every day | ORAL | 1 refills | Status: DC

## 2017-07-11 NOTE — Progress Notes (Signed)
Subjective:    Patient ID: Mikayla Sweeney, female    DOB: 08-20-1961, 56 y.o.   MRN: [id number]  HPI  56 year old female who  has a past medical history of Anxiety; Asthma; B12 deficiency; Bipolar 1 disorder ([facility]); CHF (congestive heart failure) ([facility]); Epilepsy ([facility]); Hypertension; and Sarcoidosis. She presents to the office today for the complaint of dizziness, nausea, night sweats and headache. She reports being out of all her blood pressure medication except for Norvasc   BP 178/100 and 190/100 on two separate readings.   She denies any blurred vision, fevers, vomiting, diarrhea   Review of Systems See HPI   Past Medical History:  Diagnosis Date  . Anxiety   . Asthma   . B12 deficiency   . Bipolar 1 disorder ([facility])   . CHF (congestive heart failure) ([facility])   . Epilepsy ([facility])   . Hypertension   . Sarcoidosis     Social History   Social History  . Marital status: Married    Spouse name: N/A  . Number of children: N/A  . Years of education: N/A   Occupational History  . Not on file.   Social History Main Topics  . Smoking status: Never Smoker  . Smokeless tobacco: Never Used  . Alcohol use No  . Drug use: No  . Sexual activity: Not on file   Other Topics Concern  . Not on file   Social History Narrative   She has her own business    Married    Three children    5 grandchildren       She likes to spend time with her grandchildren        Past Surgical History:  Procedure Laterality Date  . TONSILLECTOMY    . TUBAL LIGATION      Family History  Problem Relation Age of Onset  . Pancreatic cancer Mother   . Hypertension Mother   . Diabetes Mother   . Heart disease Father   . Gout Father   . Cirrhosis Father   . Heart disease Sister        CHF    No Known Allergies  Current Outpatient Prescriptions on File Prior to Visit  Medication Sig Dispense Refill  . albuterol (PROVENTIL HFA;VENTOLIN HFA) 108 (90 BASE) MCG/ACT inhaler Inhale 2 puffs into  the lungs every 6 (six) hours as needed. For wheeze or shortness of breath    . amLODipine (NORVASC) 10 MG tablet Take 1 tablet (10 mg total) by mouth daily. 30 tablet 0  . carvedilol (COREG) 6.25 MG tablet Take 1 tablet (6.25 mg total) by mouth 2 (two) times daily with a meal. 60 tablet 0  . clonazePAM (KLONOPIN) 1 MG tablet Take 1/2 tab as daily as needed 30 tablet 0  . furosemide (LASIX) 20 MG tablet Take 1 tablet (20 mg total) by mouth daily. 30 tablet 0  . hydrochlorothiazide (HYDRODIURIL) 25 MG tablet Take 1 tablet (25 mg total) by mouth daily. 30 tablet 0  . losartan (COZAAR) 100 MG tablet Take 1 tablet (100 mg total) by mouth daily. 30 tablet 0  . ondansetron (ZOFRAN ODT) 8 MG disintegrating tablet Take 1 tablet (8 mg total) by mouth every 8 (eight) hours as needed for nausea or vomiting. (Patient not taking: Reported on 07/11/2017) 10 tablet 0  . [DISCONTINUED] lisinopril (PRINIVIL,ZESTRIL) 10 MG tablet Take 1 tablet (10 mg total) by mouth daily. 30 tablet 0  . [DISCONTINUED] loratadine (CLARITIN) 10 MG  tablet Take 1 tablet (10 mg total) by mouth daily. 30 tablet 0  . [DISCONTINUED] QUEtiapine (SEROQUEL XR) 400 MG 24 hr tablet Take 400 mg by mouth at bedtime.    . [DISCONTINUED] topiramate (TOPAMAX) 200 MG tablet Take 600 mg by mouth at bedtime.     No current facility-administered medications on file prior to visit.     BP (!) 190/100 (BP Location: Left Arm)   Temp 98.1 F (36.7 C) (Oral)   Wt 182 lb (82.6 kg)   BMI 32.24 kg/m       Objective:   Physical Exam  Constitutional: She is oriented to person, place, and time. She appears well-developed and well-nourished. No distress.  Eyes: Pupils are equal, round, and reactive to light. Conjunctivae and EOM are normal. Right eye exhibits no discharge. Left eye exhibits no discharge. No scleral icterus.  Cardiovascular: Normal rate, regular rhythm and intact distal pulses.  Exam reveals no gallop and no friction rub.   No murmur  heard. Pulmonary/Chest: Effort normal. No respiratory distress. She has wheezes. She has no rales. She exhibits no tenderness.  Musculoskeletal: Normal range of motion. She exhibits no edema, tenderness or deformity.  Neurological: She is alert and oriented to person, place, and time. She has normal reflexes. She displays normal reflexes. No cranial nerve deficit. She exhibits normal muscle tone. Coordination normal.  Skin: Skin is warm and dry. No rash noted. She is not diaphoretic. No erythema. No pallor.  Psychiatric: She has a normal mood and affect. Her behavior is normal. Judgment and thought content normal.  Nursing note and vitals reviewed.     Assessment & Plan:  1. Essential hypertension - She was given a dose of clonidine in the office today. At 30 minute recheck her blood pressure was 160/92. She reported feeling improved. I am going to have her take HCTZ tonight and then start with medications as she normally would tomorrow.  - Follow up in one week  - hydrochlorothiazide (HYDRODIURIL) 25 MG tablet; Take 1 tablet (25 mg total) by mouth daily.  Dispense: 90 tablet; Refill: 1 - amLODipine (NORVASC) 10 MG tablet; Take 1 tablet (10 mg total) by mouth daily.  Dispense: 90 tablet; Refill: 1 - carvedilol (COREG) 6.25 MG tablet; Take 1 tablet (6.25 mg total) by mouth 2 (two) times daily with a meal.  Dispense: 180 tablet; Refill: 1 - furosemide (LASIX) 20 MG tablet; Take 1 tablet (20 mg total) by mouth daily.  Dispense: 90 tablet; Refill: 1 - losartan (COZAAR) 100 MG tablet; Take 1 tablet (100 mg total) by mouth daily.  Dispense: 90 tablet; Refill: 1 - cloNIDine (CATAPRES) tablet 0.1 mg; Take 1 tablet (0.1 mg total) by mouth once.  2. Nausea  - ondansetron (ZOFRAN ODT) 8 MG disintegrating tablet; Take 1 tablet (8 mg total) by mouth every 8 (eight) hours as needed for nausea or vomiting.  Dispense: 10 tablet; Refill: 0   Shirline Freesory Gaytha Raybourn, NP

## 2017-07-11 NOTE — Patient Instructions (Signed)
I am sorry you are feeling so badly.   I have sent in all your medications.   Tonight I want you to take a dose of HCTZ   Tomorrow morning, resume your medications as you normally would.   Follow up with me in one week or sooner if needed

## 2017-07-29 ENCOUNTER — Ambulatory Visit: Payer: Self-pay | Admitting: Neurology

## 2017-08-30 ENCOUNTER — Encounter: Payer: Self-pay | Admitting: Adult Health

## 2017-10-16 ENCOUNTER — Ambulatory Visit (INDEPENDENT_AMBULATORY_CARE_PROVIDER_SITE_OTHER): Payer: Medicare HMO | Admitting: Adult Health

## 2017-10-16 ENCOUNTER — Encounter: Payer: Self-pay | Admitting: Adult Health

## 2017-10-16 VITALS — BP 160/90 | Temp 98.4°F | Wt 187.0 lb

## 2017-10-16 DIAGNOSIS — Z23 Encounter for immunization: Secondary | ICD-10-CM

## 2017-10-16 DIAGNOSIS — F4321 Adjustment disorder with depressed mood: Secondary | ICD-10-CM

## 2017-10-16 DIAGNOSIS — I10 Essential (primary) hypertension: Secondary | ICD-10-CM

## 2017-10-16 DIAGNOSIS — R69 Illness, unspecified: Secondary | ICD-10-CM | POA: Diagnosis not present

## 2017-10-16 MED ORDER — CLONIDINE HCL 0.1 MG PO TABS: 0.1000 mg | ORAL_TABLET | Freq: Once | ORAL | Status: DC

## 2017-10-16 MED ORDER — CARVEDILOL 25 MG PO TABS: 25.0000 mg | ORAL_TABLET | Freq: Two times a day (BID) | ORAL | 1 refills | Status: DC

## 2017-10-16 MED ORDER — ALPRAZOLAM 0.5 MG PO TABS: 0.5000 mg | ORAL_TABLET | Freq: Every evening | ORAL | 0 refills | Status: DC | PRN

## 2017-10-16 NOTE — Progress Notes (Signed)
Subjective:    Patient ID: Mikayla Sweeney, female    DOB: June 15, 1961, 56 y.o.   MRN: [id number]  HPI  56 year old female who  has a past medical history of Anxiety, Asthma, B12 deficiency, Bipolar 1 disorder (HCC), CHF (congestive heart failure) (HCC), Epilepsy (HCC), Hypertension, and Sarcoidosis. She presents to the office today for " not feeling well". She reports fatigue, hot flashes, headache, and episodes of blurred vision. This has been presents for about one week. She reports that her sister died from a stroke over the weekend and she has been in [place], so she missed some of her blood pressure medications.   Today in the office her symptoms include hot flashes and right sided frontal headache. She denies any blurred vision. Her BP is 200/100. This has been a common theme with [place]. I last saw her in August at which time her BP was 178/100 and 190/100 on separate readings. At this time she was out of most of her BP medications. She was given a dose of clonidine in the office and her BP dropped to 160/92. She was prescribed HCTZ 25 mg and was asked to follow up in one week, but she failed to do this.   Her current regimen is that of Cozzar 100 mg, HCTZ 25 mg, Coreg 6.25 mg, and Norvasc 10 mg.   She has not been monitoring her BP at home   Review of Systems See HPI   Past Medical History:  Diagnosis Date  . Anxiety   . Asthma   . B12 deficiency   . Bipolar 1 disorder (HCC)   . CHF (congestive heart failure) (HCC)   . Epilepsy (HCC)   . Hypertension   . Sarcoidosis     Social History   Socioeconomic History  . Marital status: Married    Spouse name: Not on file  . Number of children: Not on file  . Years of education: Not on file  . Highest education level: Not on file  Social Needs  . Financial resource strain: Not on file  . Food insecurity - worry: Not on file  . Food insecurity - inability: Not on file  . Transportation needs - medical: Not on file  .  Transportation needs - non-medical: Not on file  Occupational History  . Not on file  Tobacco Use  . Smoking status: Never Smoker  . Smokeless tobacco: Never Used  Substance and Sexual Activity  . Alcohol use: No  . Drug use: No  . Sexual activity: Not on file  Other Topics Concern  . Not on file  Social History Narrative   She has her own business    Married    Three children    5 grandchildren       She likes to spend time with her grandchildren     Past Surgical History:  Procedure Laterality Date  . TONSILLECTOMY    . TUBAL LIGATION      Family History  Problem Relation Age of Onset  . Pancreatic cancer Mother   . Hypertension Mother   . Diabetes Mother   . Heart disease Father   . Gout Father   . Cirrhosis Father   . Heart disease Sister        CHF    No Known Allergies  Current Outpatient Medications on File Prior to Visit  Medication Sig Dispense Refill  . albuterol (PROVENTIL HFA;VENTOLIN HFA) 108 (90 BASE) MCG/ACT inhaler Inhale 2 puffs into  the lungs every 6 (six) hours as needed. For wheeze or shortness of breath    . amLODipine (NORVASC) 10 MG tablet Take 1 tablet (10 mg total) by mouth daily. 90 tablet 1  . clonazePAM (KLONOPIN) 1 MG tablet Take 1/2 tab as daily as needed 30 tablet 0  . furosemide (LASIX) 20 MG tablet Take 1 tablet (20 mg total) by mouth daily. 90 tablet 1  . hydrochlorothiazide (HYDRODIURIL) 25 MG tablet Take 1 tablet (25 mg total) by mouth daily. 90 tablet 1  . losartan (COZAAR) 100 MG tablet Take 1 tablet (100 mg total) by mouth daily. 90 tablet 1  . ondansetron (ZOFRAN ODT) 8 MG disintegrating tablet Take 1 tablet (8 mg total) by mouth every 8 (eight) hours as needed for nausea or vomiting. 10 tablet 0  . carvedilol (COREG) 6.25 MG tablet Take 1 tablet (6.25 mg total) by mouth 2 (two) times daily with a meal. 180 tablet 1  . [DISCONTINUED] lisinopril (PRINIVIL,ZESTRIL) 10 MG tablet Take 1 tablet (10 mg total) by mouth daily. 30  tablet 0  . [DISCONTINUED] loratadine (CLARITIN) 10 MG tablet Take 1 tablet (10 mg total) by mouth daily. 30 tablet 0  . [DISCONTINUED] QUEtiapine (SEROQUEL XR) 400 MG 24 hr tablet Take 400 mg by mouth at bedtime.    . [DISCONTINUED] topiramate (TOPAMAX) 200 MG tablet Take 600 mg by mouth at bedtime.     No current facility-administered medications on file prior to visit.     BP (!) 200/110 (BP Location: Right Arm)   Temp 98.4 F (36.9 C) (Oral)   Wt 187 lb (84.8 kg)   BMI 33.13 kg/m       Objective:   Physical Exam  Constitutional: She is oriented to person, place, and time. She appears well-developed and well-nourished. No distress.  HENT:  Head: Normocephalic and atraumatic.  Right Ear: External ear normal.  Left Ear: External ear normal.  Nose: Nose normal.  Mouth/Throat: Oropharynx is clear and moist. No oropharyngeal exudate.  Eyes: Conjunctivae and EOM are normal. Pupils are equal, round, and reactive to light. Right eye exhibits no discharge. Left eye exhibits no discharge. No scleral icterus.  Cardiovascular: Normal rate, regular rhythm, normal heart sounds and intact distal pulses. Exam reveals no gallop and no friction rub.  No murmur heard. Pulmonary/Chest: Effort normal and breath sounds normal. No respiratory distress. She has no wheezes. She has no rales. She exhibits no tenderness.  Neurological: She is alert and oriented to person, place, and time.  Skin: Skin is warm and dry. No rash noted. She is not diaphoretic. No erythema. No pallor.  Psychiatric: She has a normal mood and affect. Her behavior is normal. Judgment and thought content normal.  Nursing note and vitals reviewed.     Assessment & Plan:  1. Essential hypertension - She was given 0.1 mg clonidine in the office and 40 minutes later her blood pressure had decreased to 160/90. She no longer had a headache. I increased Coreg to 25 mg BID and referred her to Nephrology to help get her blood pressure  under control. She will follow up in one week when she return from [place]. Advised that if she starts to have a headache or has blurred vision that she needs to go to the local ER  - carvedilol (COREG) 25 MG tablet; Take 1 tablet (25 mg total) 2 (two) times daily with a meal by mouth.  Dispense: 180 tablet; Refill: 1 - Ambulatory referral to Nephrology -  cloNIDine (CATAPRES) tablet 0.1 mg  2. Need for prophylactic vaccination and inoculation against influenza  - Flu Vaccine QUAD 6+ mos PF IM (Fluarix Quad PF)  3. Grieving  - ALPRAZolam (XANAX) 0.5 MG tablet; Take 1 tablet (0.5 mg total) at bedtime as needed by mouth for anxiety.  Dispense: 15 tablet; Refill: 0   Shirline Freesory Andre Gallego, NP

## 2017-10-16 NOTE — Patient Instructions (Signed)
I have increased your Coreg from 6.25 mg to 25 mg twice a day   Take the first dose tonight   Follow up with me in one week   You have been referred to nephrology to help get your blood pressure under control

## 2017-10-24 ENCOUNTER — Ambulatory Visit: Payer: Medicare HMO | Admitting: Adult Health

## 2017-10-24 ENCOUNTER — Telehealth: Payer: Self-pay | Admitting: *Deleted

## 2017-10-24 NOTE — Telephone Encounter (Signed)
Spoke to the pt and informed her she will need to come in and be evaluated for medication.  Pt has an appt on 10/28/17 and states she cannot come in sooner.  She will call back if she is able to come in sooner.

## 2017-10-24 NOTE — Telephone Encounter (Signed)
Copied from CRM [id number]. Topic: Inquiry >> Oct 24, 2017 12:26 PM Everardo Pacific[place], [place], [place] wrote: Reason for CRM: Patient stated that she is having neck pain and would like to know if [name] could call her something in for the pain. If someone could give her a call back about this matter.

## 2017-10-28 ENCOUNTER — Encounter: Payer: Self-pay | Admitting: Adult Health

## 2017-10-28 ENCOUNTER — Ambulatory Visit (INDEPENDENT_AMBULATORY_CARE_PROVIDER_SITE_OTHER): Payer: Medicare HMO | Admitting: Adult Health

## 2017-10-28 VITALS — BP 130/80 | Temp 98.5°F | Wt 188.0 lb

## 2017-10-28 DIAGNOSIS — M542 Cervicalgia: Secondary | ICD-10-CM | POA: Diagnosis not present

## 2017-10-28 DIAGNOSIS — I10 Essential (primary) hypertension: Secondary | ICD-10-CM

## 2017-10-28 NOTE — Progress Notes (Signed)
Subjective:    Patient ID: Mikayla Sweeney, female    DOB: 1961-08-19, 56 y.o.   MRN: [id number]  HPI  56 year old female who  has a past medical history of Anxiety, Asthma, B12 deficiency, Bipolar 1 disorder (HCC), CHF (congestive heart failure) (HCC), Epilepsy (HCC), Hypertension, and Sarcoidosis. she presents to the office today for follow up regarding hypertension. During her last visit her BP was 200/100, Coreg was increased from 6.25 mg to 25 mg BID. She denies any headaches, blurred vision, dizziness, or lightheadedness.   She also reports 2-3 days of right sided upper back pain. Pain is described as a " ache" and is worse with palpation and horizontal head movements. She has been using Motrin and reports improvement in her pain. No numbness or tingling in her right arm    Review of Systems See HPI   Past Medical History:  Diagnosis Date  . Anxiety   . Asthma   . B12 deficiency   . Bipolar 1 disorder (HCC)   . CHF (congestive heart failure) (HCC)   . Epilepsy (HCC)   . Hypertension   . Sarcoidosis     Social History   Socioeconomic History  . Marital status: Married    Spouse name: Not on file  . Number of children: Not on file  . Years of education: Not on file  . Highest education level: Not on file  Social Needs  . Financial resource strain: Not on file  . Food insecurity - worry: Not on file  . Food insecurity - inability: Not on file  . Transportation needs - medical: Not on file  . Transportation needs - non-medical: Not on file  Occupational History  . Not on file  Tobacco Use  . Smoking status: Never Smoker  . Smokeless tobacco: Never Used  Substance and Sexual Activity  . Alcohol use: No  . Drug use: No  . Sexual activity: Not on file  Other Topics Concern  . Not on file  Social History Narrative   She has her own business    Married    Three children    5 grandchildren       She likes to spend time with her grandchildren     Past Surgical  History:  Procedure Laterality Date  . TONSILLECTOMY    . TUBAL LIGATION      Family History  Problem Relation Age of Onset  . Pancreatic cancer Mother   . Hypertension Mother   . Diabetes Mother   . Heart disease Father   . Gout Father   . Cirrhosis Father   . Heart disease Sister        CHF    No Known Allergies  Current Outpatient Medications on File Prior to Visit  Medication Sig Dispense Refill  . albuterol (PROVENTIL HFA;VENTOLIN HFA) 108 (90 BASE) MCG/ACT inhaler Inhale 2 puffs into the lungs every 6 (six) hours as needed. For wheeze or shortness of breath    . ALPRAZolam (XANAX) 0.5 MG tablet Take 1 tablet (0.5 mg total) at bedtime as needed by mouth for anxiety. 15 tablet 0  . amLODipine (NORVASC) 10 MG tablet Take 1 tablet (10 mg total) by mouth daily. 90 tablet 1  . carvedilol (COREG) 25 MG tablet Take 1 tablet (25 mg total) 2 (two) times daily with a meal by mouth. 180 tablet 1  . furosemide (LASIX) 20 MG tablet Take 1 tablet (20 mg total) by mouth daily. 90 tablet  1  . hydrochlorothiazide (HYDRODIURIL) 25 MG tablet Take 1 tablet (25 mg total) by mouth daily. 90 tablet 1  . losartan (COZAAR) 100 MG tablet Take 1 tablet (100 mg total) by mouth daily. 90 tablet 1  . ondansetron (ZOFRAN ODT) 8 MG disintegrating tablet Take 1 tablet (8 mg total) by mouth every 8 (eight) hours as needed for nausea or vomiting. 10 tablet 0  . [DISCONTINUED] lisinopril (PRINIVIL,ZESTRIL) 10 MG tablet Take 1 tablet (10 mg total) by mouth daily. 30 tablet 0  . [DISCONTINUED] loratadine (CLARITIN) 10 MG tablet Take 1 tablet (10 mg total) by mouth daily. 30 tablet 0  . [DISCONTINUED] QUEtiapine (SEROQUEL XR) 400 MG 24 hr tablet Take 400 mg by mouth at bedtime.    . [DISCONTINUED] topiramate (TOPAMAX) 200 MG tablet Take 600 mg by mouth at bedtime.     Current Facility-Administered Medications on File Prior to Visit  Medication Dose Route Frequency Provider Last Rate Last Dose  . cloNIDine  (CATAPRES) tablet 0.1 mg  0.1 mg Oral Once Leylanie Woodmansee, NP        BP 130/80 (BP Location: Left Arm)   Temp 98.5 F (36.9 C) (Oral)   Wt 188 lb (85.3 kg)   BMI 33.30 kg/m       Objective:   Physical Exam  Constitutional: She is oriented to person, place, and time. She appears well-developed and well-nourished. No distress.  Cardiovascular: Normal rate, regular rhythm, normal heart sounds and intact distal pulses. Exam reveals no gallop and no friction rub.  No murmur heard. Pulmonary/Chest: Effort normal and breath sounds normal. No respiratory distress. She has no wheezes. She has no rales. She exhibits no tenderness.  Musculoskeletal: Normal range of motion. She exhibits tenderness (right trapezius ). She exhibits no edema or deformity.  Neurological: She is alert and oriented to person, place, and time.  Skin: Skin is warm and dry. No rash noted. She is not diaphoretic. No erythema. No pallor.  Psychiatric: She has a normal mood and affect. Her behavior is normal. Judgment and thought content normal.  Nursing note and vitals reviewed.     Assessment & Plan:  1. Essential hypertension - 130/80 today in the office. Much better controlled. No change in medications  - Advised to monitor at home  - Follow up for CPE   2. Neck pain - Muscle strain with improvement with Motrin. Continue motrin. Add heading pad  - Massage therapy if she would like  - Follow up as needed   Shirline Freesory Wynee Matarazzo, NP

## 2017-11-09 ENCOUNTER — Other Ambulatory Visit: Payer: Self-pay

## 2017-11-09 ENCOUNTER — Encounter (HOSPITAL_COMMUNITY): Payer: Self-pay | Admitting: Emergency Medicine

## 2017-11-09 ENCOUNTER — Ambulatory Visit (HOSPITAL_COMMUNITY)
Admission: EM | Admit: 2017-11-09 | Discharge: 2017-11-09 | Disposition: A | Discharge status: Home / Self Care | Payer: Medicare HMO | Attending: Family Medicine | Admitting: Family Medicine

## 2017-11-09 DIAGNOSIS — I110 Hypertensive heart disease with heart failure: Secondary | ICD-10-CM | POA: Insufficient documentation

## 2017-11-09 DIAGNOSIS — E538 Deficiency of other specified B group vitamins: Secondary | ICD-10-CM | POA: Insufficient documentation

## 2017-11-09 DIAGNOSIS — R350 Frequency of micturition: Secondary | ICD-10-CM | POA: Diagnosis present

## 2017-11-09 DIAGNOSIS — G40909 Epilepsy, unspecified, not intractable, without status epilepticus: Secondary | ICD-10-CM | POA: Insufficient documentation

## 2017-11-09 DIAGNOSIS — N390 Urinary tract infection, site not specified: Secondary | ICD-10-CM | POA: Diagnosis not present

## 2017-11-09 DIAGNOSIS — D869 Sarcoidosis, unspecified: Secondary | ICD-10-CM | POA: Diagnosis not present

## 2017-11-09 DIAGNOSIS — J45909 Unspecified asthma, uncomplicated: Secondary | ICD-10-CM | POA: Insufficient documentation

## 2017-11-09 DIAGNOSIS — I509 Heart failure, unspecified: Secondary | ICD-10-CM | POA: Diagnosis not present

## 2017-11-09 DIAGNOSIS — F419 Anxiety disorder, unspecified: Secondary | ICD-10-CM | POA: Insufficient documentation

## 2017-11-09 DIAGNOSIS — Z79899 Other long term (current) drug therapy: Secondary | ICD-10-CM | POA: Diagnosis not present

## 2017-11-09 DIAGNOSIS — Z87440 Personal history of urinary (tract) infections: Secondary | ICD-10-CM | POA: Diagnosis present

## 2017-11-09 DIAGNOSIS — R109 Unspecified abdominal pain: Secondary | ICD-10-CM | POA: Diagnosis present

## 2017-11-09 DIAGNOSIS — R69 Illness, unspecified: Secondary | ICD-10-CM | POA: Diagnosis not present

## 2017-11-09 LAB — POCT URINALYSIS DIP (DEVICE)
Bilirubin Urine: NEGATIVE
Glucose, UA: NEGATIVE mg/dL
HGB URINE DIPSTICK: NEGATIVE
Ketones, ur: NEGATIVE mg/dL
NITRITE: NEGATIVE
PROTEIN: NEGATIVE mg/dL
SPECIFIC GRAVITY, URINE: 1.015 (ref 1.005–1.030)
UROBILINOGEN UA: 0.2 mg/dL (ref 0.0–1.0)
pH: 6.5 (ref 5.0–8.0)

## 2017-11-09 MED ORDER — SULFAMETHOXAZOLE-TRIMETHOPRIM 800-160 MG PO TABS: 1.0000 | ORAL_TABLET | Freq: Two times a day (BID) | ORAL | 0 refills | Status: DC

## 2017-11-09 NOTE — ED Provider Notes (Signed)
[facility]   [id number] 11/09/17 Arrival Time: 1758   SUBJECTIVE:  Mikayla PollenRobin B Sweeney is a 56 y.o. female who presents to the urgent care with complaint of Nausea, no vomiting for 2 days.  Abdominal pain, hot flashes, frequent urination  Last UTI was over a year ago.   Past Medical History:  Diagnosis Date  . Anxiety   . Asthma   . B12 deficiency   . Bipolar 1 disorder (HCC)   . CHF (congestive heart failure) (HCC)   . Epilepsy (HCC)   . Hypertension   . Sarcoidosis    Family History  Problem Relation Age of Onset  . Pancreatic cancer Mother   . Hypertension Mother   . Diabetes Mother   . Heart disease Father   . Gout Father   . Cirrhosis Father   . Heart disease Sister        CHF   Social History   Socioeconomic History  . Marital status: Married    Spouse name: Not on file  . Number of children: Not on file  . Years of education: Not on file  . Highest education level: Not on file  Social Needs  . Financial resource strain: Not on file  . Food insecurity - worry: Not on file  . Food insecurity - inability: Not on file  . Transportation needs - medical: Not on file  . Transportation needs - non-medical: Not on file  Occupational History  . Not on file  Tobacco Use  . Smoking status: Never Smoker  . Smokeless tobacco: Never Used  Substance and Sexual Activity  . Alcohol use: No  . Drug use: No  . Sexual activity: Not on file  Other Topics Concern  . Not on file  Social History Narrative   She has her own business    Married    Three children    5 grandchildren       She likes to spend time with her grandchildren    Current Facility-Administered Medications for the 11/09/17 encounter [facility](Hospital Encounter)  Medication  . cloNIDine (CATAPRES) tablet 0.1 mg   No outpatient medications have been marked as taking for the 11/09/17 encounter [facility](Hospital Encounter).   No Known Allergies    ROS: As per HPI, remainder of ROS  negative.   OBJECTIVE:   Vitals:   11/09/17 1818  BP: 126/67  Pulse: 69  Resp: 20  Temp: 98.1 F (36.7 C)  TempSrc: Oral  SpO2: 100%     General appearance: alert; no distress Eyes: PERRL; EOMI; conjunctiva normal HENT: normocephalic; atraumatic; external ears normal without trauma; nasal mucosa normal; oral mucosa normal Neck: supple Abdomen: soft, lower abdominal tender; bowel sounds normal; no masses or organomegaly; no guarding or rebound tenderness Back: no CVA tenderness Extremities: no cyanosis or edema; symmetrical with no gross deformities Skin: warm and dry Neurologic: normal gait; grossly normal Psychological: alert and cooperative; normal mood and affect      Labs:  Results for orders placed or performed during the hospital encounter of 11/09/17  POCT urinalysis dip (device)  Result Value Ref Range   Glucose, UA NEGATIVE NEGATIVE mg/dL   Bilirubin Urine NEGATIVE NEGATIVE   Ketones, ur NEGATIVE NEGATIVE mg/dL   Specific Gravity, Urine 1.015 1.005 - 1.030   Hgb urine dipstick NEGATIVE NEGATIVE   pH 6.5 5.0 - 8.0   Protein, ur NEGATIVE NEGATIVE mg/dL   Urobilinogen, UA 0.2 0.0 - 1.0 mg/dL   Nitrite NEGATIVE NEGATIVE   Leukocytes,  UA SMALL (A) NEGATIVE    Labs Reviewed  POCT URINALYSIS DIP (DEVICE) - Abnormal; Notable for the following components:      Result Value   Leukocytes, UA SMALL (*)    All other components within normal limits  URINE CULTURE    No results found.     ASSESSMENT & PLAN:  1. Lower urinary tract infectious disease     Meds ordered this encounter  Medications  . sulfamethoxazole-trimethoprim (BACTRIM DS,SEPTRA DS) 800-160 MG tablet    Sig: Take 1 tablet by mouth 2 (two) times daily for 7 days.    Dispense:  14 tablet    Refill:  0    Reviewed expectations re: course of current medical issues. Questions answered. Outlined signs and symptoms indicating need for more acute intervention. Patient verbalized  understanding. After Visit Summary given.    Procedures:      Elvina SidleLauenstein, Okie Jansson, MD 11/09/17 1843

## 2017-11-09 NOTE — Discharge Instructions (Signed)
Urine test suggests that you have a simple urinary tract infection and it should respond to the antibiotics prescribed tonight.  Please start taking the medicine tonight and instructions are to take 1 pill twice a day.  Follow each pill with 8 ounces of water.

## 2017-11-09 NOTE — ED Triage Notes (Signed)
Nausea, no vomiting for 2 days.  Abdominal pain, hot flashes, frequent urination

## 2017-11-11 ENCOUNTER — Telehealth (HOSPITAL_COMMUNITY): Payer: Self-pay | Admitting: Internal Medicine

## 2017-11-11 LAB — URINE CULTURE: Culture: 80000 — AB

## 2017-11-11 MED ORDER — CEPHALEXIN 500 MG PO CAPS: 500.0000 mg | ORAL_CAPSULE | Freq: Two times a day (BID) | ORAL | 0 refills | Status: DC

## 2017-11-11 NOTE — Telephone Encounter (Signed)
Clinical staff, please let patient know that urine culture was positive for E coli germ, resistant to trimethoprim/sulfa rx given at the urgent care visit but sensitive to cephalexin. Stop trimethoprim/sulfa.  Rx cephalexin sent to the pharmacy of record, [facility] [address] [facility] [place].  Take all of the cephalexin.  Recheck or followup with PCP for further evaluation if symptoms are not improving.  LM

## 2017-11-27 ENCOUNTER — Ambulatory Visit (INDEPENDENT_AMBULATORY_CARE_PROVIDER_SITE_OTHER): Payer: Medicare HMO | Admitting: Adult Health

## 2017-11-27 ENCOUNTER — Encounter: Payer: Self-pay | Admitting: Adult Health

## 2017-11-27 VITALS — BP 122/90 | Temp 97.9°F | Wt 192.0 lb

## 2017-11-27 DIAGNOSIS — N3001 Acute cystitis with hematuria: Secondary | ICD-10-CM

## 2017-11-27 DIAGNOSIS — M5412 Radiculopathy, cervical region: Secondary | ICD-10-CM | POA: Diagnosis not present

## 2017-11-27 LAB — POCT URINALYSIS DIPSTICK
BILIRUBIN UA: NEGATIVE
Blood, UA: NEGATIVE
GLUCOSE UA: NEGATIVE
KETONES UA: NEGATIVE
Leukocytes, UA: NEGATIVE
Nitrite, UA: NEGATIVE
Protein, UA: NEGATIVE
Spec Grav, UA: 1.025 (ref 1.010–1.025)
Urobilinogen, UA: 0.2 E.U./dL
pH, UA: 5.5 (ref 5.0–8.0)

## 2017-11-27 MED ORDER — METHYLPREDNISOLONE 4 MG PO TBPK: ORAL_TABLET | ORAL | 0 refills | Status: DC

## 2017-11-27 MED ORDER — CYCLOBENZAPRINE HCL 10 MG PO TABS: 10.0000 mg | ORAL_TABLET | Freq: Every day | ORAL | 0 refills | Status: DC

## 2017-11-27 NOTE — Progress Notes (Signed)
Subjective:    Patient ID: Mikayla Sweeney, female    DOB: Aug 27, 1961, 56 y.o.   MRN: [id number]  HPI  56 year old female who  has a past medical history of Anxiety, Asthma, B12 deficiency, Bipolar 1 disorder (HCC), CHF (congestive heart failure) (HCC), Epilepsy (HCC), Hypertension, and Sarcoidosis. She presents to the office today for follow up regarding neck pain. She was last seen for this on 10/28/2017. At which time she was taking Motrin which improved the symptoms. She had pain in her right trapezius with palpation. She was advised to continue to use motrin and use a heating pad.   Today in the office she reports that her neck pain has not improved and has since become worse. She reports now that her pain has started to be present in bilateral shoulder with numbness and tingling down both arms.   She denies any changes in grip strength and is not dropping any items at home.  She reports that the pain is intermittent and is worse in the morning. She continues to use motrin but feels as though it is not helpin  She would also like to have a UA performed. She was treated in the ER last week for a UTI and has finished her ABX.  Does not feel as though she has any symptoms at this time   Review of Systems See HPI   Past Medical History:  Diagnosis Date  . Anxiety   . Asthma   . B12 deficiency   . Bipolar 1 disorder (HCC)   . CHF (congestive heart failure) (HCC)   . Epilepsy (HCC)   . Hypertension   . Sarcoidosis     Social History   Socioeconomic History  . Marital status: Married    Spouse name: Not on file  . Number of children: Not on file  . Years of education: Not on file  . Highest education level: Not on file  Social Needs  . Financial resource strain: Not on file  . Food insecurity - worry: Not on file  . Food insecurity - inability: Not on file  . Transportation needs - medical: Not on file  . Transportation needs - non-medical: Not on file  Occupational History   . Not on file  Tobacco Use  . Smoking status: Never Smoker  . Smokeless tobacco: Never Used  Substance and Sexual Activity  . Alcohol use: No  . Drug use: No  . Sexual activity: Not on file  Other Topics Concern  . Not on file  Social History Narrative   She has her own business    Married    Three children    5 grandchildren       She likes to spend time with her grandchildren     Past Surgical History:  Procedure Laterality Date  . TONSILLECTOMY    . TUBAL LIGATION      Family History  Problem Relation Age of Onset  . Pancreatic cancer Mother   . Hypertension Mother   . Diabetes Mother   . Heart disease Father   . Gout Father   . Cirrhosis Father   . Heart disease Sister        CHF    No Known Allergies  Current Outpatient Medications on File Prior to Visit  Medication Sig Dispense Refill  . albuterol (PROVENTIL HFA;VENTOLIN HFA) 108 (90 BASE) MCG/ACT inhaler Inhale 2 puffs into the lungs every 6 (six) hours as needed. For wheeze or shortness  of breath    . ALPRAZolam (XANAX) 0.5 MG tablet Take 1 tablet (0.5 mg total) at bedtime as needed by mouth for anxiety. 15 tablet 0  . amLODipine (NORVASC) 10 MG tablet Take 1 tablet (10 mg total) by mouth daily. 90 tablet 1  . carvedilol (COREG) 25 MG tablet Take 1 tablet (25 mg total) 2 (two) times daily with a meal by mouth. 180 tablet 1  . furosemide (LASIX) 20 MG tablet Take 1 tablet (20 mg total) by mouth daily. 90 tablet 1  . hydrochlorothiazide (HYDRODIURIL) 25 MG tablet Take 1 tablet (25 mg total) by mouth daily. 90 tablet 1  . losartan (COZAAR) 100 MG tablet Take 1 tablet (100 mg total) by mouth daily. 90 tablet 1  . ondansetron (ZOFRAN ODT) 8 MG disintegrating tablet Take 1 tablet (8 mg total) by mouth every 8 (eight) hours as needed for nausea or vomiting. 10 tablet 0  . [DISCONTINUED] lisinopril (PRINIVIL,ZESTRIL) 10 MG tablet Take 1 tablet (10 mg total) by mouth daily. 30 tablet 0  . [DISCONTINUED] loratadine  (CLARITIN) 10 MG tablet Take 1 tablet (10 mg total) by mouth daily. 30 tablet 0  . [DISCONTINUED] QUEtiapine (SEROQUEL XR) 400 MG 24 hr tablet Take 400 mg by mouth at bedtime.    . [DISCONTINUED] topiramate (TOPAMAX) 200 MG tablet Take 600 mg by mouth at bedtime.     Current Facility-Administered Medications on File Prior to Visit  Medication Dose Route Frequency Provider Last Rate Last Dose  . cloNIDine (CATAPRES) tablet 0.1 mg  0.1 mg Oral Once Aristides Luckey, NP        BP 122/90 (BP Location: Left Arm)   Temp 97.9 F (36.6 C) (Oral)   Wt 192 lb (87.1 kg)   BMI 34.01 kg/m       Objective:   Physical Exam  Constitutional: She is oriented to person, place, and time. She appears well-developed and well-nourished. No distress.  Cardiovascular: Normal rate, regular rhythm, normal heart sounds and intact distal pulses. Exam reveals no gallop and no friction rub.  No murmur heard. Pulmonary/Chest: Effort normal and breath sounds normal. No respiratory distress. She has no wheezes. She has no rales. She exhibits no tenderness.  Musculoskeletal: Normal range of motion. She exhibits no edema, tenderness or deformity.  Np spinal tenderness No shoulder tenderness  Tenderness with palpation to bilateral trapezius   Neurological: She is alert and oriented to person, place, and time.  Skin: Skin is warm and dry. No rash noted. She is not diaphoretic. No erythema. No pallor.  Psychiatric: She has a normal mood and affect. Her behavior is normal. Judgment and thought content normal.  Vitals reviewed.     Assessment & Plan:  1. Cervical radiculopathy - methylPREDNISolone (MEDROL DOSEPAK) 4 MG TBPK tablet; Take as directed  Dispense: 21 tablet; Refill: 0 - cyclobenzaprine (FLEXERIL) 10 MG tablet; Take 1 tablet (10 mg total) by mouth at bedtime.  Dispense: 15 tablet; Refill: 0 - DG Cervical Spine Complete; Future - Consider referral to orthopedics  2. Acute cystitis with hematuria  - POC  Urinalysis Dipstick  Shirline Freesory Maricela Kawahara, NP

## 2017-11-27 NOTE — Patient Instructions (Signed)
I have sent in prednisone and flexeril for your neck and shoulder pain. Please go to the [facility] office and get an x ray   Follow up with me if no improvement   I will follow up with you regarding your urinalysis

## 2017-12-06 ENCOUNTER — Telehealth: Payer: Self-pay | Admitting: Adult Health

## 2017-12-06 NOTE — Telephone Encounter (Signed)
Noted and updated on result note

## 2017-12-06 NOTE — Telephone Encounter (Signed)
Unable to document in result notes.  Results given.   Notes recorded by Shirline FreesNafziger, Cory, NP on 11/27/2017 at 2:50 PM EST urinalysis is negative for UTI

## 2017-12-12 ENCOUNTER — Ambulatory Visit: Payer: Self-pay | Admitting: *Deleted

## 2017-12-12 NOTE — Telephone Encounter (Signed)
Pt states she has been experiencing hot flashes, nausea and headaches since Sunday. Pt has been able to tolerate solid food and has not had any episodes of vomiting or diarrhea. Pt denies having any sick contacts or any changes in medications or food. Pt given home care advise an advised to call office back if symptoms worsen or no improvement. Pt verbalized understanding.  Reason for Disposition . Unexplained nausea  Answer Assessment - Initial Assessment Questions 1. NAUSEA SEVERITY: "How bad is the nausea?" (e.g., mild, moderate, severe; dehydration, weight loss)   - MILD: loss of appetite without change in eating habits   - MODERATE: decreased oral intake without significant weight loss, dehydration, or malnutrition   - SEVERE: inadequate caloric or fluid intake, significant weight loss, symptoms of dehydration     Mild 2. ONSET: "When did the nausea begin?"     Sunday 3. VOMITING: "Any vomiting?" If so, ask: "How many times today?"     No 4. RECURRENT SYMPTOM: "Have you had nausea before?" If so, ask: "When was the last time?" "What happened that time?"     No 5. CAUSE: "What do you think is causing the nausea?"     No  Protocols used: NAUSEA-A-AH

## 2018-01-28 ENCOUNTER — Encounter: Payer: Self-pay | Admitting: Adult Health

## 2018-01-28 ENCOUNTER — Ambulatory Visit (INDEPENDENT_AMBULATORY_CARE_PROVIDER_SITE_OTHER): Payer: Medicare HMO | Admitting: Adult Health

## 2018-01-28 VITALS — BP 130/80 | Temp 98.2°F | Wt 199.0 lb

## 2018-01-28 DIAGNOSIS — J069 Acute upper respiratory infection, unspecified: Secondary | ICD-10-CM

## 2018-01-28 LAB — POC INFLUENZA A&B (BINAX/QUICKVUE)
Influenza A, POC: NEGATIVE
Influenza B, POC: NEGATIVE

## 2018-01-28 MED ORDER — PREDNISONE 10 MG PO TABS: ORAL_TABLET | ORAL | 0 refills | Status: DC

## 2018-01-28 MED ORDER — DOXYCYCLINE HYCLATE 100 MG PO CAPS: 100.0000 mg | ORAL_CAPSULE | Freq: Two times a day (BID) | ORAL | 0 refills | Status: DC

## 2018-01-28 NOTE — Progress Notes (Signed)
Subjective:    Patient ID: Mikayla Sweeney, female    DOB: 04-09-1961, 57 y.o.   MRN: [id number]  HPI  Patient with history of asthma, CHF, HTN and sarcoidosis presents with 7-8 day history of sore throat, fever & chills (3 days since last episode), nasal congestion, dry cough that has progressed to productive, yellow sputum, fatigue and myalgia. .  Denies sinus pain or pressure.  She has had some intermittent wheezing.  Pain in the right "shoulder blade" with coughing. She was feeling some better but has started feeling worse in the last 2 days. She has used OTC Coricidin, Pepto-Bismol, Nyquil and alka-seltzer with some relief of symptoms.    Review of Systems  Constitutional: Positive for chills, diaphoresis, fatigue and fever. Negative for activity change and appetite change.  HENT: Positive for congestion, postnasal drip and sore throat. Negative for ear discharge, ear pain, rhinorrhea, sinus pressure, sinus pain and sneezing.   Eyes: Negative.   Respiratory: Positive for cough and wheezing. Negative for apnea, choking, chest tightness, shortness of breath and stridor.   Cardiovascular: Negative for chest pain, palpitations and leg swelling.  Gastrointestinal: Positive for nausea. Negative for abdominal pain, constipation, diarrhea and vomiting.  Musculoskeletal: Positive for myalgias.   Past Medical History:  Diagnosis Date  . Anxiety   . Asthma   . B12 deficiency   . Bipolar 1 disorder (HCC)   . CHF (congestive heart failure) (HCC)   . Epilepsy (HCC)   . Hypertension   . Sarcoidosis     Social History   Socioeconomic History  . Marital status: Married    Spouse name: Not on file  . Number of children: Not on file  . Years of education: Not on file  . Highest education level: Not on file  Social Needs  . Financial resource strain: Not on file  . Food insecurity - worry: Not on file  . Food insecurity - inability: Not on file  . Transportation needs - medical: Not on file   . Transportation needs - non-medical: Not on file  Occupational History  . Not on file  Tobacco Use  . Smoking status: Never Smoker  . Smokeless tobacco: Never Used  Substance and Sexual Activity  . Alcohol use: No  . Drug use: No  . Sexual activity: Not on file  Other Topics Concern  . Not on file  Social History Narrative   She has her own business    Married    Three children    5 grandchildren       She likes to spend time with her grandchildren     Past Surgical History:  Procedure Laterality Date  . TONSILLECTOMY    . TUBAL LIGATION      Family History  Problem Relation Age of Onset  . Pancreatic cancer Mother   . Hypertension Mother   . Diabetes Mother   . Heart disease Father   . Gout Father   . Cirrhosis Father   . Heart disease Sister        CHF    No Known Allergies  Current Outpatient Medications on File Prior to Visit  Medication Sig Dispense Refill  . albuterol (PROVENTIL HFA;VENTOLIN HFA) 108 (90 BASE) MCG/ACT inhaler Inhale 2 puffs into the lungs every 6 (six) hours as needed. For wheeze or shortness of breath    . ALPRAZolam (XANAX) 0.5 MG tablet Take 1 tablet (0.5 mg total) at bedtime as needed by mouth for anxiety. 15  tablet 0  . amLODipine (NORVASC) 10 MG tablet Take 1 tablet (10 mg total) by mouth daily. 90 tablet 1  . cyclobenzaprine (FLEXERIL) 10 MG tablet Take 1 tablet (10 mg total) by mouth at bedtime. 15 tablet 0  . furosemide (LASIX) 20 MG tablet Take 1 tablet (20 mg total) by mouth daily. 90 tablet 1  . hydrochlorothiazide (HYDRODIURIL) 25 MG tablet Take 1 tablet (25 mg total) by mouth daily. 90 tablet 1  . losartan (COZAAR) 100 MG tablet Take 1 tablet (100 mg total) by mouth daily. 90 tablet 1  . carvedilol (COREG) 25 MG tablet Take 1 tablet (25 mg total) 2 (two) times daily with a meal by mouth. 180 tablet 1  . [DISCONTINUED] lisinopril (PRINIVIL,ZESTRIL) 10 MG tablet Take 1 tablet (10 mg total) by mouth daily. 30 tablet 0  .  [DISCONTINUED] loratadine (CLARITIN) 10 MG tablet Take 1 tablet (10 mg total) by mouth daily. 30 tablet 0  . [DISCONTINUED] QUEtiapine (SEROQUEL XR) 400 MG 24 hr tablet Take 400 mg by mouth at bedtime.    . [DISCONTINUED] topiramate (TOPAMAX) 200 MG tablet Take 600 mg by mouth at bedtime.     Current Facility-Administered Medications on File Prior to Visit  Medication Dose Route Frequency Provider Last Rate Last Dose  . cloNIDine (CATAPRES) tablet 0.1 mg  0.1 mg Oral Once [name], Cory, NP        BP 130/80 (BP Location: Left Arm)   Temp 98.2 F (36.8 C) (Oral)   Wt 199 lb (90.3 kg)   BMI 35.25 kg/m      Objective:   Physical Exam  Constitutional: She appears well-developed and well-nourished. No distress.  HENT:  Right Ear: Tympanic membrane and ear canal normal.  Left Ear: Tympanic membrane normal. There is swelling.  Nose: Mucosal edema present. No rhinorrhea.  Mouth/Throat: Uvula is midline and mucous membranes are normal. No oropharyngeal exudate, posterior oropharyngeal edema or posterior oropharyngeal erythema.  Swelling of left external ear canal Inferior nasal turbinate erythema and swollen left greater than right  Eyes: Right eye exhibits no discharge. Left eye exhibits no discharge. No scleral icterus.  Neck: Neck supple. No JVD present.  Cardiovascular: Normal rate, regular rhythm and normal heart sounds. Exam reveals no gallop and no friction rub.  No murmur heard. Pulmonary/Chest: Effort normal and breath sounds normal. No respiratory distress. She has no wheezes. She has no rales.  Lymphadenopathy:    She has cervical adenopathy.  Skin: Skin is warm. She is diaphoretic.  Nursing note and vitals reviewed.     Assessment & Plan:  1. Upper respiratory tract infection, unspecified type Continue with fluids and hot soups/tea.  Tylenol for discomfort or fever. If symptoms worsen, advised to go to the ER.  - POC Influenza A&B(BINAX/QUICKVUE) - doxycycline  (VIBRAMYCIN) 100 MG capsule; Take 1 capsule (100 mg total) by mouth 2 (two) times daily.  Dispense: 14 capsule; Refill: 0 - predniSONE (DELTASONE) 10 MG tablet; 40 mg x 3 days, 20 mg x 3 days, 10 mg x 3 days  Dispense: 21 tablet; Refill: 0

## 2018-01-28 NOTE — Progress Notes (Signed)
Subjective:    Patient ID: Mikayla Sweeney, female    DOB: 12-Oct-1961, 57 y.o.   MRN: [id number]  URI   This is a new problem. The current episode started in the past 7 days. The problem has been gradually worsening. The maximum temperature recorded prior to her arrival was 100.4 - 100.9 F. Associated symptoms include congestion, coughing (productive ), nausea, a sore throat and wheezing. Pertinent negatives include no diarrhea, dysuria, rhinorrhea, sinus pain or vomiting. She has tried decongestant for the symptoms. The treatment provided mild relief.     Review of Systems  Constitutional: Positive for activity change, appetite change, diaphoresis, fatigue and fever.  HENT: Positive for congestion and sore throat. Negative for postnasal drip, rhinorrhea and sinus pain.   Respiratory: Positive for cough (productive ), shortness of breath and wheezing.   Cardiovascular: Negative.   Gastrointestinal: Positive for nausea. Negative for diarrhea and vomiting.  Genitourinary: Negative for dysuria.   Past Medical History:  Diagnosis Date  . Anxiety   . Asthma   . B12 deficiency   . Bipolar 1 disorder (HCC)   . CHF (congestive heart failure) (HCC)   . Epilepsy (HCC)   . Hypertension   . Sarcoidosis     Social History   Socioeconomic History  . Marital status: Married    Spouse name: Not on file  . Number of children: Not on file  . Years of education: Not on file  . Highest education level: Not on file  Social Needs  . Financial resource strain: Not on file  . Food insecurity - worry: Not on file  . Food insecurity - inability: Not on file  . Transportation needs - medical: Not on file  . Transportation needs - non-medical: Not on file  Occupational History  . Not on file  Tobacco Use  . Smoking status: Never Smoker  . Smokeless tobacco: Never Used  Substance and Sexual Activity  . Alcohol use: No  . Drug use: No  . Sexual activity: Not on file  Other Topics Concern  .  Not on file  Social History Narrative   She has her own business    Married    Three children    5 grandchildren       She likes to spend time with her grandchildren     Past Surgical History:  Procedure Laterality Date  . TONSILLECTOMY    . TUBAL LIGATION      Family History  Problem Relation Age of Onset  . Pancreatic cancer Mother   . Hypertension Mother   . Diabetes Mother   . Heart disease Father   . Gout Father   . Cirrhosis Father   . Heart disease Sister        CHF    No Known Allergies  Current Outpatient Medications on File Prior to Visit  Medication Sig Dispense Refill  . albuterol (PROVENTIL HFA;VENTOLIN HFA) 108 (90 BASE) MCG/ACT inhaler Inhale 2 puffs into the lungs every 6 (six) hours as needed. For wheeze or shortness of breath    . ALPRAZolam (XANAX) 0.5 MG tablet Take 1 tablet (0.5 mg total) at bedtime as needed by mouth for anxiety. 15 tablet 0  . amLODipine (NORVASC) 10 MG tablet Take 1 tablet (10 mg total) by mouth daily. 90 tablet 1  . cyclobenzaprine (FLEXERIL) 10 MG tablet Take 1 tablet (10 mg total) by mouth at bedtime. 15 tablet 0  . furosemide (LASIX) 20 MG tablet Take 1  tablet (20 mg total) by mouth daily. 90 tablet 1  . hydrochlorothiazide (HYDRODIURIL) 25 MG tablet Take 1 tablet (25 mg total) by mouth daily. 90 tablet 1  . losartan (COZAAR) 100 MG tablet Take 1 tablet (100 mg total) by mouth daily. 90 tablet 1  . carvedilol (COREG) 25 MG tablet Take 1 tablet (25 mg total) 2 (two) times daily with a meal by mouth. 180 tablet 1  . [DISCONTINUED] lisinopril (PRINIVIL,ZESTRIL) 10 MG tablet Take 1 tablet (10 mg total) by mouth daily. 30 tablet 0  . [DISCONTINUED] loratadine (CLARITIN) 10 MG tablet Take 1 tablet (10 mg total) by mouth daily. 30 tablet 0  . [DISCONTINUED] QUEtiapine (SEROQUEL XR) 400 MG 24 hr tablet Take 400 mg by mouth at bedtime.    . [DISCONTINUED] topiramate (TOPAMAX) 200 MG tablet Take 600 mg by mouth at bedtime.     Current  Facility-Administered Medications on File Prior to Visit  Medication Dose Route Frequency Provider Last Rate Last Dose  . cloNIDine (CATAPRES) tablet 0.1 mg  0.1 mg Oral Once Elexa Kivi, NP        BP 130/80 (BP Location: Left Arm)   Temp 98.2 F (36.8 C) (Oral)   Wt 199 lb (90.3 kg)   BMI 35.25 kg/m       Objective:   Physical Exam  Constitutional: She is oriented to person, place, and time. She appears well-developed and well-nourished. She appears ill. No distress.  HENT:  Right Ear: Hearing, external ear and ear canal normal. Tympanic membrane is bulging. Tympanic membrane is not erythematous.  Left Ear: Hearing and ear canal normal. Tympanic membrane is bulging. Tympanic membrane is not erythematous.  Nose: Mucosal edema and rhinorrhea present. Right sinus exhibits no maxillary sinus tenderness and no frontal sinus tenderness. Left sinus exhibits no frontal sinus tenderness.  Mouth/Throat: Uvula is midline. Posterior oropharyngeal erythema present. No oropharyngeal exudate or posterior oropharyngeal edema.  Cardiovascular: Normal rate, regular rhythm, normal heart sounds and intact distal pulses. Exam reveals no gallop and no friction rub.  No murmur heard. Pulmonary/Chest: Effort normal and breath sounds normal. No respiratory distress. She has no wheezes. She has no rales. She exhibits no tenderness.  Neurological: She is alert and oriented to person, place, and time.  Skin: Skin is warm. No rash noted. She is diaphoretic. No erythema. No pallor.  Psychiatric: She has a normal mood and affect. Her behavior is normal. Judgment and thought content normal.  Nursing note and vitals reviewed.     Assessment & Plan:  1. Upper respiratory tract infection, unspecified type - POC Influenza A&B(BINAX/QUICKVUE)- negative - Will treat URI due to time frame and symptoms. Advised to follow up in 2-3 days if not improved.  - Stay hydrated and rest  - doxycycline (VIBRAMYCIN) 100 MG  capsule; Take 1 capsule (100 mg total) by mouth 2 (two) times daily.  Dispense: 14 capsule; Refill: 0 - predniSONE (DELTASONE) 10 MG tablet; 40 mg x 3 days, 20 mg x 3 days, 10 mg x 3 days  Dispense: 21 tablet; Refill: 0  Shirline Freesory Jamilee Lafosse, NP

## 2018-02-12 ENCOUNTER — Ambulatory Visit: Payer: Medicare HMO | Admitting: Adult Health

## 2018-02-12 DIAGNOSIS — Z0289 Encounter for other administrative examinations: Secondary | ICD-10-CM

## 2018-02-18 ENCOUNTER — Ambulatory Visit (INDEPENDENT_AMBULATORY_CARE_PROVIDER_SITE_OTHER): Payer: Medicare HMO | Admitting: Adult Health

## 2018-02-18 ENCOUNTER — Encounter: Payer: Self-pay | Admitting: Adult Health

## 2018-02-18 VITALS — BP 158/104 | Temp 97.8°F | Wt 200.0 lb

## 2018-02-18 DIAGNOSIS — J069 Acute upper respiratory infection, unspecified: Secondary | ICD-10-CM | POA: Diagnosis not present

## 2018-02-18 DIAGNOSIS — M5412 Radiculopathy, cervical region: Secondary | ICD-10-CM | POA: Diagnosis not present

## 2018-02-18 MED ORDER — CYCLOBENZAPRINE HCL 10 MG PO TABS: 10.0000 mg | ORAL_TABLET | Freq: Every day | ORAL | 0 refills | Status: DC

## 2018-02-18 MED ORDER — FLUTICASONE PROPIONATE 50 MCG/ACT NA SUSP: 2.0000 | Freq: Every day | NASAL | 6 refills | Status: DC

## 2018-02-18 NOTE — Progress Notes (Signed)
Subjective:    Patient ID: Mikayla Sweeney, female    DOB: 14-May-1961, 57 y.o.   MRN: [id number]  HPI 57 year old female who  has a past medical history of Anxiety, Asthma, B12 deficiency, Bipolar 1 disorder (HCC), CHF (congestive heart failure) (HCC), Epilepsy (HCC), Hypertension, and Sarcoidosis. She presents to the office today for an acute complaint of URI. Her symptoms include that of fatigue, sinus pain and pressure, dry cough, nasal congestion, and sore throat.   She denies any fevers. Her symptoms have been present for 4 days. She has been using Dayquil   She was treated one month ago for URI with prednisone and doxycycline. Which she reports cleared her up but her symptoms returned   Also complains of continued pain in her cervical spine with tightness in her shoulders. She had some left over Flexeril which she has taken and has noticed improvement. She has an order from a previous visit for a cervical spine x ray, she never went and had this done.   Review of Systems Sere HPI   Past Medical History:  Diagnosis Date  . Anxiety   . Asthma   . B12 deficiency   . Bipolar 1 disorder (HCC)   . CHF (congestive heart failure) (HCC)   . Epilepsy (HCC)   . Hypertension   . Sarcoidosis     Social History   Socioeconomic History  . Marital status: Married    Spouse name: Not on file  . Number of children: Not on file  . Years of education: Not on file  . Highest education level: Not on file  Social Needs  . Financial resource strain: Not on file  . Food insecurity - worry: Not on file  . Food insecurity - inability: Not on file  . Transportation needs - medical: Not on file  . Transportation needs - non-medical: Not on file  Occupational History  . Not on file  Tobacco Use  . Smoking status: Never Smoker  . Smokeless tobacco: Never Used  Substance and Sexual Activity  . Alcohol use: No  . Drug use: No  . Sexual activity: Not on file  Other Topics Concern  . Not on  file  Social History Narrative   She has her own business    Married    Three children    5 grandchildren       She likes to spend time with her grandchildren     Past Surgical History:  Procedure Laterality Date  . TONSILLECTOMY    . TUBAL LIGATION      Family History  Problem Relation Age of Onset  . Pancreatic cancer Mother   . Hypertension Mother   . Diabetes Mother   . Heart disease Father   . Gout Father   . Cirrhosis Father   . Heart disease Sister        CHF    No Known Allergies  Current Outpatient Medications on File Prior to Visit  Medication Sig Dispense Refill  . albuterol (PROVENTIL HFA;VENTOLIN HFA) 108 (90 BASE) MCG/ACT inhaler Inhale 2 puffs into the lungs every 6 (six) hours as needed. For wheeze or shortness of breath    . ALPRAZolam (XANAX) 0.5 MG tablet Take 1 tablet (0.5 mg total) at bedtime as needed by mouth for anxiety. 15 tablet 0  . amLODipine (NORVASC) 10 MG tablet Take 1 tablet (10 mg total) by mouth daily. 90 tablet 1  . furosemide (LASIX) 20 MG tablet Take  1 tablet (20 mg total) by mouth daily. 90 tablet 1  . hydrochlorothiazide (HYDRODIURIL) 25 MG tablet Take 1 tablet (25 mg total) by mouth daily. 90 tablet 1  . losartan (COZAAR) 100 MG tablet Take 1 tablet (100 mg total) by mouth daily. 90 tablet 1  . carvedilol (COREG) 25 MG tablet Take 1 tablet (25 mg total) 2 (two) times daily with a meal by mouth. 180 tablet 1  . [DISCONTINUED] lisinopril (PRINIVIL,ZESTRIL) 10 MG tablet Take 1 tablet (10 mg total) by mouth daily. 30 tablet 0  . [DISCONTINUED] loratadine (CLARITIN) 10 MG tablet Take 1 tablet (10 mg total) by mouth daily. 30 tablet 0  . [DISCONTINUED] QUEtiapine (SEROQUEL XR) 400 MG 24 hr tablet Take 400 mg by mouth at bedtime.    . [DISCONTINUED] topiramate (TOPAMAX) 200 MG tablet Take 600 mg by mouth at bedtime.     Current Facility-Administered Medications on File Prior to Visit  Medication Dose Route Frequency Provider Last Rate Last  Dose  . cloNIDine (CATAPRES) tablet 0.1 mg  0.1 mg Oral Once Saraann Enneking, NP        BP (!) 158/104   Temp 97.8 F (36.6 C) (Oral)   Wt 200 lb (90.7 kg)   BMI 35.43 kg/m       Objective:   Physical Exam  Constitutional: She is oriented to person, place, and time. She appears well-developed and well-nourished. No distress.  HENT:  Right Ear: Hearing, tympanic membrane, external ear and ear canal normal.  Left Ear: Hearing, tympanic membrane, external ear and ear canal normal.  Nose: Mucosal edema present. No rhinorrhea. Right sinus exhibits maxillary sinus tenderness. Left sinus exhibits no maxillary sinus tenderness and no frontal sinus tenderness.  Mouth/Throat: Uvula is midline, oropharynx is clear and moist and mucous membranes are normal.  Cardiovascular: Normal rate, regular rhythm, normal heart sounds and intact distal pulses. Exam reveals no gallop and no friction rub.  No murmur heard. Pulmonary/Chest: Effort normal and breath sounds normal. No respiratory distress. She has no wheezes. She has no rales. She exhibits no tenderness.  Musculoskeletal: Normal range of motion. She exhibits tenderness. She exhibits no edema or deformity.  Tenderness with palpation to bilateral trapezius   Neurological: She is alert and oriented to person, place, and time.  Skin: Skin is warm and dry. No rash noted. She is not diaphoretic. No erythema. No pallor.  Psychiatric: She has a normal mood and affect. Her behavior is normal. Judgment and thought content normal.  Nursing note and vitals reviewed.     Assessment & Plan:  1. Cervical radiculopathy - Get x ray  - cyclobenzaprine (FLEXERIL) 10 MG tablet; Take 1 tablet (10 mg total) by mouth at bedtime.  Dispense: 15 tablet; Refill: 0  2. Upper respiratory tract infection, unspecified type - Does not appear to be bacterial in nature. Advised Flonase   - Stop dayquil as it it causing her BP to elevate  - fluticasone (FLONASE) 50 MCG/ACT  nasal spray; Place 2 sprays into both nostrils daily.  Dispense: 16 g; Refill: 6  Shirline Freesory Yanin Muhlestein, NP

## 2018-02-18 NOTE — Progress Notes (Signed)
Subjective:    Patient ID: Mikayla Sweeney, female    DOB: 10/05/1961, 57 y.o.   MRN: [id number]  Presents with recurrent upper respiratory symptoms similar to those treated 01/29/18.  She was treated with Prednisone taper and a course of Doxycycline.  She felt better after 2 days with complete resolution of symptoms.  4 days ago she started having sore throat, headache, nasal congestion, non-productive cough, right ear pain, wheezing though not enough to necessitate using her Albuterol inhaler.  She denies fever but has had some chills. She has been using OTC Dayquil and her BP is elevated today. She has been taking care of her grandchild that has had these same symptoms.    Neck Pain   Associated symptoms include headaches. Pertinent negatives include no chest pain or fever.  Cough  Associated symptoms include ear pain, headaches, a sore throat and wheezing. Pertinent negatives include no chest pain, chills, fever, postnasal drip, rhinorrhea or shortness of breath.   Review of Systems  Constitutional: Positive for fatigue. Negative for chills and fever.  HENT: Positive for congestion, ear pain, sinus pressure, sinus pain and sore throat. Negative for postnasal drip, rhinorrhea and sneezing.   Respiratory: Positive for cough, chest tightness and wheezing. Negative for shortness of breath.   Cardiovascular: Negative for chest pain and palpitations.  Gastrointestinal: Negative for diarrhea, nausea and vomiting.  Musculoskeletal: Positive for neck pain.       With numbness and tingling to the hands bilateral.   Neurological: Positive for headaches. Negative for dizziness.      Objective:   Physical Exam  Constitutional: She appears well-developed and well-nourished. No distress.  HENT:  Right Ear: Tympanic membrane normal.  Left Ear: Tympanic membrane normal.  Nose: Mucosal edema and rhinorrhea present.  Mouth/Throat: Uvula is midline, oropharynx is clear and moist and mucous membranes are  normal. No oropharyngeal exudate, posterior oropharyngeal edema or posterior oropharyngeal erythema.  Redness and edema in the external ear canal.  There is no tragus pain or pain with inspection. Bilateral nasal turbinates are edematous and erythematous with minimal exudate.   Eyes: Right eye exhibits no discharge. Left eye exhibits no discharge.  Neck: Decreased range of motion present.    Tenderness over cervical spine.  Refer to graphical documentation for location.   Cardiovascular: Normal rate, regular rhythm and normal heart sounds. Exam reveals no gallop and no friction rub.  No murmur heard. Pulmonary/Chest: Effort normal and breath sounds normal. No respiratory distress. She has no wheezes. She has no rales.  Musculoskeletal:  Tenderness in bilateral trapezius muscles.   Lymphadenopathy:    She has no cervical adenopathy.  Neurological: She displays no atrophy and no tremor. She exhibits normal muscle tone.  Strength 5/5 in upper extremities.   Skin: Skin is warm and dry. She is not diaphoretic.  Nursing note and vitals reviewed.     Assessment & Plan:  1. Cervical radiculopathy Warm compresses to neck and upper back.  Massage therapy.  - cyclobenzaprine (FLEXERIL) 10 MG tablet; Take 1 tablet (10 mg total) by mouth at bedtime.  Dispense: 15 tablet; Refill: 0  2. Upper respiratory tract infection, unspecified type Symptomatic care.  Warm salt water gargles for sore throat.  Hot tea or hot soup.  Saline nasal spray and flonase for nasal congestion. Tylenol for fever or discomfort.  Please stop Dayquil and avoid products containing phenylephrine. May use Coricidin HBP or plain Mucinex. Given return to office information.  - fluticasone (FLONASE) 50  MCG/ACT nasal spray; Place 2 sprays into both nostrils daily.  Dispense: 16 g; Refill: 6  Brittin Janik C Darivs Lunden BSN RN NP student

## 2018-03-05 ENCOUNTER — Telehealth: Payer: Self-pay | Admitting: Adult Health

## 2018-03-05 NOTE — Telephone Encounter (Signed)
I spoke with pt and she would like further advice from [facility]. She was recently seen here in office and was told to call back if no better.

## 2018-03-05 NOTE — Telephone Encounter (Signed)
Copied from CRM [id number]. Topic: Quick Communication - See Telephone Encounter >> Mar 05, 2018  8:26 AM Diana EvesHoyt, Maryann B wrote: CRM for notification. See Telephone encounter for: 03/05/18. Pt was told to call back in if she starts getting sick again. She has a sore throat, cough, nauseous and throwing up.

## 2018-03-06 NOTE — Telephone Encounter (Signed)
Spoke to patient and she informed me on her symptoms. I advised that she will need to be come in and be seen and possibly swabbed for strep. If needed we will supply abx at that time

## 2018-03-06 NOTE — Telephone Encounter (Signed)
    Patient called again and said she is  not felling better  She still has a sore throat she has body aches , coughing , wet and dry  Sore throat she also has chills that come and go she is so miserable  , and would like something called in

## 2018-03-07 ENCOUNTER — Encounter: Payer: Self-pay | Admitting: Adult Health

## 2018-03-07 ENCOUNTER — Ambulatory Visit (INDEPENDENT_AMBULATORY_CARE_PROVIDER_SITE_OTHER): Payer: Medicare HMO | Admitting: Adult Health

## 2018-03-07 VITALS — BP 124/80 | HR 77 | Temp 98.0°F | Wt 195.0 lb

## 2018-03-07 DIAGNOSIS — J020 Streptococcal pharyngitis: Secondary | ICD-10-CM

## 2018-03-07 LAB — POCT RAPID STREP A (OFFICE): RAPID STREP A SCREEN: NEGATIVE

## 2018-03-07 MED ORDER — PREDNISONE 10 MG PO TABS: 10.0000 mg | ORAL_TABLET | Freq: Every day | ORAL | 0 refills | Status: DC

## 2018-03-07 MED ORDER — ONDANSETRON HCL 4 MG PO TABS: 4.0000 mg | ORAL_TABLET | Freq: Three times a day (TID) | ORAL | 0 refills | Status: DC | PRN

## 2018-03-07 MED ORDER — AZITHROMYCIN 250 MG PO TABS: ORAL_TABLET | ORAL | 0 refills | Status: DC

## 2018-03-07 NOTE — Progress Notes (Signed)
Subjective:    Patient ID: Mikayla Sweeney, female    DOB: [birth year], 57 y.o.   MRN: [id number]  HPI   Review of Systems  Constitutional: Positive for activity change, appetite change, chills and fatigue. Negative for fever.  HENT: Positive for congestion, sore throat, trouble swallowing and voice change. Negative for ear pain, sinus pressure and sinus pain.   Respiratory: Positive for cough (semi productive ) and wheezing. Negative for chest tightness and shortness of breath.   Cardiovascular: Negative.   Gastrointestinal: Positive for nausea and vomiting.  Musculoskeletal: Negative.   Neurological: Positive for headaches.   Past Medical History:  Diagnosis Date  . Anxiety   . Asthma   . B12 deficiency   . Bipolar 1 disorder (HCC)   . CHF (congestive heart failure) (HCC)   . Epilepsy (HCC)   . Hypertension   . Sarcoidosis     Social History   Socioeconomic History  . Marital status: Married    Spouse name: Not on file  . Number of children: Not on file  . Years of education: Not on file  . Highest education level: Not on file  Occupational History  . Not on file  Social Needs  . Financial resource strain: Not on file  . Food insecurity:    Worry: Not on file    Inability: Not on file  . Transportation needs:    Medical: Not on file    Non-medical: Not on file  Tobacco Use  . Smoking status: Never Smoker  . Smokeless tobacco: Never Used  Substance and Sexual Activity  . Alcohol use: No  . Drug use: No  . Sexual activity: Not on file  Lifestyle  . Physical activity:    Days per week: Not on file    Minutes per session: Not on file  . Stress: Not on file  Relationships  . Social connections:    Talks on phone: Not on file    Gets together: Not on file    Attends religious service: Not on file    Active member of club or organization: Not on file    Attends meetings of clubs or organizations: Not on file    Relationship status: Not on file  . Intimate  partner violence:    Fear of current or ex partner: Not on file    Emotionally abused: Not on file    Physically abused: Not on file    Forced sexual activity: Not on file  Other Topics Concern  . Not on file  Social History Narrative   She has her own business    Married    Three children    5 grandchildren       She likes to spend time with her grandchildren     Past Surgical History:  Procedure Laterality Date  . TONSILLECTOMY    . TUBAL LIGATION      Family History  Problem Relation Age of Onset  . Pancreatic cancer Mother   . Hypertension Mother   . Diabetes Mother   . Heart disease Father   . Gout Father   . Cirrhosis Father   . Heart disease Sister        CHF    No Known Allergies  Current Outpatient Medications on File Prior to Visit  Medication Sig Dispense Refill  . albuterol (PROVENTIL HFA;VENTOLIN HFA) 108 (90 BASE) MCG/ACT inhaler Inhale 2 puffs into the lungs every 6 (six) hours as needed. For wheeze or  shortness of breath    . ALPRAZolam (XANAX) 0.5 MG tablet Take 1 tablet (0.5 mg total) at bedtime as needed by mouth for anxiety. 15 tablet 0  . amLODipine (NORVASC) 10 MG tablet Take 1 tablet (10 mg total) by mouth daily. 90 tablet 1  . cyclobenzaprine (FLEXERIL) 10 MG tablet Take 1 tablet (10 mg total) by mouth at bedtime. 15 tablet 0  . fluticasone (FLONASE) 50 MCG/ACT nasal spray Place 2 sprays into both nostrils daily. 16 g 6  . furosemide (LASIX) 20 MG tablet Take 1 tablet (20 mg total) by mouth daily. 90 tablet 1  . hydrochlorothiazide (HYDRODIURIL) 25 MG tablet Take 1 tablet (25 mg total) by mouth daily. 90 tablet 1  . losartan (COZAAR) 100 MG tablet Take 1 tablet (100 mg total) by mouth daily. 90 tablet 1  . carvedilol (COREG) 25 MG tablet Take 1 tablet (25 mg total) 2 (two) times daily with a meal by mouth. 180 tablet 1  . [DISCONTINUED] lisinopril (PRINIVIL,ZESTRIL) 10 MG tablet Take 1 tablet (10 mg total) by mouth daily. 30 tablet 0  .  [DISCONTINUED] loratadine (CLARITIN) 10 MG tablet Take 1 tablet (10 mg total) by mouth daily. 30 tablet 0  . [DISCONTINUED] QUEtiapine (SEROQUEL XR) 400 MG 24 hr tablet Take 400 mg by mouth at bedtime.    . [DISCONTINUED] topiramate (TOPAMAX) 200 MG tablet Take 600 mg by mouth at bedtime.     Current Facility-Administered Medications on File Prior to Visit  Medication Dose Route Frequency Provider Last Rate Last Dose  . cloNIDine (CATAPRES) tablet 0.1 mg  0.1 mg Oral Once Paola Aleshire, NP        BP 124/80 (BP Location: Left Arm)   Pulse 77   Temp 98 F (36.7 C)   Wt 195 lb (88.5 kg)   SpO2 97%   BMI 34.54 kg/m       Objective:   Physical Exam  Constitutional: She is oriented to person, place, and time. She appears well-developed and well-nourished. No distress.  HENT:  Right Ear: Hearing, tympanic membrane, external ear and ear canal normal.  Left Ear: Hearing, tympanic membrane, external ear and ear canal normal.  Nose: Nose normal. No mucosal edema or rhinorrhea. Right sinus exhibits no maxillary sinus tenderness and no frontal sinus tenderness. Left sinus exhibits no maxillary sinus tenderness and no frontal sinus tenderness.  Mouth/Throat: Mucous membranes are normal. Posterior oropharyngeal edema and posterior oropharyngeal erythema present. No oropharyngeal exudate or tonsillar abscesses.  Neck: Normal range of motion. Neck supple. No JVD present. No tracheal deviation present. No thyromegaly present.  Cardiovascular: Normal rate, regular rhythm, normal heart sounds and intact distal pulses. Exam reveals no gallop and no friction rub.  No murmur heard. Pulmonary/Chest: Effort normal and breath sounds normal. No stridor. No respiratory distress. She has no wheezes. She has no rales. She exhibits no tenderness.  Abdominal: Soft. Bowel sounds are normal. She exhibits no distension and no mass. There is no tenderness. There is no rebound and no guarding.  Musculoskeletal: Normal  range of motion. She exhibits no edema, tenderness or deformity.  Lymphadenopathy:       Head (right side): Submandibular and tonsillar adenopathy present.       Head (left side): Submandibular and tonsillar adenopathy present.    She has no cervical adenopathy.  Neurological: She is alert and oriented to person, place, and time. She has normal reflexes. She displays normal reflexes. No cranial nerve deficit. She exhibits normal muscle  tone. Coordination normal.  Skin: Skin is warm and dry. No rash noted. She is not diaphoretic. No erythema. No pallor.  Psychiatric: She has a normal mood and affect. Her behavior is normal. Judgment and thought content normal.  Nursing note and vitals reviewed.     Assessment & Plan:  1. Pharyngitis due to Streptococcus species  - ondansetron (ZOFRAN) 4 MG tablet; Take 1 tablet (4 mg total) by mouth every 8 (eight) hours as needed for nausea or vomiting.  Dispense: 20 tablet; Refill: 0 - azithromycin (ZITHROMAX Z-PAK) 250 MG tablet; Take 2 tablets on Day 1.  Then take 1 tablet daily.  Dispense: 6 tablet; Refill: 0 - predniSONE (DELTASONE) 10 MG tablet; Take 1 tablet (10 mg total) by mouth daily with breakfast.  Dispense: 5 tablet; Refill: 0 - POC Rapid Strep A- negative. Will treat due to symptoms   Shirline Frees, NP

## 2018-04-17 ENCOUNTER — Telehealth: Payer: Self-pay | Admitting: Family Medicine

## 2018-04-17 NOTE — Telephone Encounter (Signed)
Copied from CRM [id number]. Topic: Referral - Question >> Apr 17, 2018  3:06 PM Oneal Grout wrote: Reason for CRM: [facility] has been unable to contact pt, they are closing referral.

## 2018-04-17 NOTE — Telephone Encounter (Signed)
Left a message for a return call.

## 2018-04-18 ENCOUNTER — Other Ambulatory Visit: Payer: Self-pay | Admitting: Adult Health

## 2018-04-18 ENCOUNTER — Telehealth: Payer: Self-pay | Admitting: Adult Health

## 2018-04-18 DIAGNOSIS — I10 Essential (primary) hypertension: Secondary | ICD-10-CM

## 2018-04-18 MED ORDER — HYDROCHLOROTHIAZIDE 25 MG PO TABS: 25.0000 mg | ORAL_TABLET | Freq: Every day | ORAL | 0 refills | Status: DC

## 2018-04-18 NOTE — Telephone Encounter (Signed)
Copied from CRM [id number]. Topic: Quick Communication - Rx Refill/Question >> Apr 18, 2018 12:44 PM Mickel Baas B, NT wrote: Medication: hydrochlorothiazide (HYDRODIURIL) 25 MG tablet Has the patient contacted their pharmacy? Yes.   (Agent: If no, request that the patient contact the pharmacy for the refill.)  Preferred Pharmacy (with phone number or street name): [id number] - [place], [place] - [address]  Agent: Please be advised that RX refills may take up to 3 business days. We ask that you follow-up with your pharmacy.

## 2018-04-21 NOTE — Telephone Encounter (Addendum)
Relation to pt: self Call back number: [phone] [facility]acy[facility] [address] [place], [place] [address] [phone]   Reason for call:  Patient will be out of the office for 1  Month and would like the following medication sent to new pharmacy  [facility] [address] [place], [place] [address] [phone], patient wanted it noted she's completely out and in the future she will call pharmacy in advance, please advise

## 2018-04-22 MED ORDER — HYDROCHLOROTHIAZIDE 25 MG PO TABS: 25.0000 mg | ORAL_TABLET | Freq: Every day | ORAL | 0 refills | Status: DC

## 2018-04-22 NOTE — Telephone Encounter (Signed)
30 day supply has been sent to the pharmacy by e-scribe.

## 2018-04-22 NOTE — Addendum Note (Signed)
Addended by: Raj Janus T on: 04/22/2018 05:12 PM   Modules accepted: Orders

## 2018-04-22 NOTE — Telephone Encounter (Signed)
Do not see that this pt has had a physical.  Please advise.

## 2018-04-22 NOTE — Telephone Encounter (Signed)
Do not see that this pt has had a physical.  Please advise. 

## 2018-04-22 NOTE — Telephone Encounter (Signed)
90 days and needs a CPE in that time frame

## 2018-04-22 NOTE — Telephone Encounter (Signed)
Ok to refill for 90 days. Please have her schedule her CPE

## 2018-04-23 ENCOUNTER — Encounter: Payer: Self-pay | Admitting: Family Medicine

## 2018-04-23 NOTE — Telephone Encounter (Signed)
Sent by e-scribe for 90 days.  Letter mailed to the pt.

## 2018-04-23 NOTE — Telephone Encounter (Signed)
Sent to the pharmacy by e-scribe for 90 days.  Letter mailed to the pt. 

## 2018-04-29 ENCOUNTER — Ambulatory Visit (INDEPENDENT_AMBULATORY_CARE_PROVIDER_SITE_OTHER)
Admission: RE | Admit: 2018-04-29 | Discharge: 2018-04-29 | Disposition: A | Discharge status: Home / Self Care | Payer: Medicare HMO | Source: Ambulatory Visit | Attending: Adult Health | Admitting: Adult Health

## 2018-04-29 ENCOUNTER — Ambulatory Visit: Payer: Self-pay | Admitting: *Deleted

## 2018-04-29 DIAGNOSIS — M5412 Radiculopathy, cervical region: Secondary | ICD-10-CM | POA: Diagnosis not present

## 2018-04-29 DIAGNOSIS — M542 Cervicalgia: Secondary | ICD-10-CM | POA: Diagnosis not present

## 2018-04-29 NOTE — Telephone Encounter (Signed)
Pt calling with complaints of pain/numbness to bilateral arms. Pt states she has experienced the pain over the last 6 months but the pain has gotten worse. Pt seen today and had xrays of cervical spine done. Pt asking for appt on tomorrow due to going out of town on Thursday.Pt given home care advice to help with pain.  Pt scheduled for appt on tomorrow with Malachi Pro.  Reason for Disposition . Numbness (i.e., loss of sensation) in hand or fingers  Answer Assessment - Initial Assessment Questions 1. ONSET: "When did the pain start?"     Approximately 6 months ago  2. LOCATION: "Where is the pain located?"     Bilateral arms 3. PAIN: "How bad is the pain?" (Scale 1-10; or mild, moderate, severe)   - MILD (1-3): doesn't interfere with normal activities   - MODERATE (4-7): interferes with normal activities (e.g., work or school) or awakens from sleep   - SEVERE (8-10): excruciating pain, unable to do any normal activities, unable to hold a cup of water     Pain has increased, pain rating not given 4. CAUSE: "What do you think is causing the arm pain?"     Unknown, pt had xrays done on today 5. OTHER SYMPTOMS: "Do you have any other symptoms?" (e.g., neck pain, swelling, rash, fever, numbness, weakness) Numbness  Protocols used: ARM PAIN-A-AH

## 2018-04-30 ENCOUNTER — Ambulatory Visit: Payer: Medicare HMO | Admitting: Adult Health

## 2018-04-30 ENCOUNTER — Ambulatory Visit: Payer: Self-pay | Admitting: Hematology

## 2018-04-30 ENCOUNTER — Other Ambulatory Visit: Payer: Self-pay

## 2018-04-30 ENCOUNTER — Telehealth: Payer: Self-pay | Admitting: Adult Health

## 2018-04-30 MED ORDER — NAPROXEN 500 MG PO TABS: 500.0000 mg | ORAL_TABLET | Freq: Three times a day (TID) | ORAL | 0 refills | Status: DC

## 2018-04-30 NOTE — Telephone Encounter (Signed)
Copied from CRM [id number]. Topic: Quick Communication - Rx Refill/Question >> Apr 30, 2018  3:56 PM Crist Infante wrote: Medication:  naproxen (NAPROSYN) 500 MG tablet   Pt states this in not at the pharmacy. Can you resend?  Thanks [id number] [place], [place] - [address] [phone] (Phone) [phone] (Fax)

## 2018-04-30 NOTE — Telephone Encounter (Signed)
Mikayla Sweeney, pt would like to know why she is being referred to nephrology for her hypertension.  Please advise.

## 2018-04-30 NOTE — Telephone Encounter (Signed)
Because way back when I referred her over there, her BP was very uncontrolled and severely elevated. At that time, I though that Nephrology could help [place] get it under control. That is not the case any longer.

## 2018-04-30 NOTE — Addendum Note (Signed)
Addended by: Raj Janus T on: 04/30/2018 02:54 PM   Modules accepted: Orders

## 2018-04-30 NOTE — Telephone Encounter (Signed)
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 I would like to go ahead and do a MRI to her cervical spine since she continues to have numbness and tingling.   She can have Naproxyn 500 mg BID x 30 days for pain relief.

## 2018-04-30 NOTE — Telephone Encounter (Signed)
Patient states Mikayla Sweeney is aware of the situation. She is having numbness and tingling in both arms.  Per patient it could be from a pinched nerve.  Patient has no other neurologic symptoms. Patient had an appointment today for 11:15 but she wanted to cancel that appointment.  Patient is wanting to have something called in for her pain if possible, and she would like to know the results of her x-ray.   Reason for Disposition . Caused by strained muscle  Answer Assessment - Initial Assessment Questions 1. ONSET: "When did the pain start?"     6 months ago 2. LOCATION: "Where is the pain located?" Both arms   4. WORK OR EXERCISE: "Has there been any recent work or exercise that involved this part of the body?"     no 5. CAUSE: "What do you think is causing the arm pain?"  possible pinched nerve  Protocols used: ARM PAIN-A-AH

## 2018-04-30 NOTE — Telephone Encounter (Signed)
Rx did not e-scribe. Resent to pharmacy.

## 2018-04-30 NOTE — Telephone Encounter (Signed)
Pt notified and rx sent to the pharmacy by e-scribe.

## 2018-05-01 NOTE — Telephone Encounter (Signed)
Left a message for a return call.

## 2018-05-06 ENCOUNTER — Other Ambulatory Visit: Payer: Self-pay | Admitting: Family Medicine

## 2018-05-06 DIAGNOSIS — R202 Paresthesia of skin: Secondary | ICD-10-CM

## 2018-05-06 DIAGNOSIS — M5412 Radiculopathy, cervical region: Secondary | ICD-10-CM

## 2018-05-06 DIAGNOSIS — R200 Anesthesia of skin: Secondary | ICD-10-CM

## 2018-05-06 NOTE — Telephone Encounter (Signed)
Pt notified she does not need to go to nephrology.

## 2018-05-08 ENCOUNTER — Other Ambulatory Visit: Payer: Self-pay | Admitting: Adult Health

## 2018-05-11 ENCOUNTER — Other Ambulatory Visit: Payer: Self-pay | Admitting: Adult Health

## 2018-05-11 ENCOUNTER — Inpatient Hospital Stay: Admission: RE | Admit: 2018-05-11 | Payer: Medicare HMO | Source: Ambulatory Visit

## 2018-05-11 DIAGNOSIS — I10 Essential (primary) hypertension: Secondary | ICD-10-CM

## 2018-05-13 NOTE — Telephone Encounter (Signed)
Sent in on 04/23/18 for #90.  Message sent to the pharmacy.

## 2018-05-19 ENCOUNTER — Inpatient Hospital Stay: Admission: RE | Admit: 2018-05-19 | Payer: Medicare HMO | Source: Ambulatory Visit

## 2018-06-03 ENCOUNTER — Telehealth: Payer: Self-pay | Admitting: Adult Health

## 2018-06-03 NOTE — Telephone Encounter (Signed)
Sent to Dr. Clent RidgesFry to advise while pt's PCP is out of the office

## 2018-06-03 NOTE — Telephone Encounter (Signed)
  Copied from CRM [id number]. Topic: Quick Communication - See Telephone Encounter >> Jun 03, 2018 11:40 AM Arlyss Gandyichardson, Destanie Tibbetts N, NT wrote: CRM for notification. See Telephone encounter for: 06/03/18. Pt states she has been unable to schedule her MRI because they have stated to her that pre-auth is required from her insurance. She is wanting to see if something can be ordered for pain in the mean time.    Pt calling to check status on the pain medicine.

## 2018-06-03 NOTE — Telephone Encounter (Addendum)
Kandee KeenCory is not Available Will see if Dr Clent RidgesFry can address in the meantime. Pt requesting medication for pain Pt is having MRI for  C-spine stenosis  Dx: Cervical radiculopathy [U98.11[M54.12 (ICD-10-CM)]; Numbness and tingling of both upper extremities [R20.0, R20.2 (ICD-10-CM)]

## 2018-06-06 MED ORDER — NAPROXEN 500 MG PO TABS: 500.0000 mg | ORAL_TABLET | Freq: Three times a day (TID) | ORAL | 0 refills | Status: DC

## 2018-06-06 NOTE — Telephone Encounter (Signed)
Call in Naproxen #60 with no rf

## 2018-06-06 NOTE — Telephone Encounter (Signed)
Rx has been called into pt's pharmacy.  

## 2018-06-14 ENCOUNTER — Other Ambulatory Visit: Payer: Self-pay | Admitting: Adult Health

## 2018-06-14 DIAGNOSIS — I10 Essential (primary) hypertension: Secondary | ICD-10-CM

## 2018-06-17 NOTE — Telephone Encounter (Signed)
Tried to reach the pt. Received a message that her voicemail box is full.   This prescription has been sent to multiple pharmacies.  Unsure which pharmacy is correct.  Need to speak to the pt.

## 2018-06-24 NOTE — Telephone Encounter (Signed)
#  30 sent to the pharmacy by e-scribe.  Left a message for a return call.  CRM created.  Pt needs cpx.

## 2018-06-24 NOTE — Telephone Encounter (Signed)
Left a message for a return call.

## 2018-07-15 ENCOUNTER — Other Ambulatory Visit: Payer: Self-pay | Admitting: Adult Health

## 2018-07-15 DIAGNOSIS — I10 Essential (primary) hypertension: Secondary | ICD-10-CM

## 2018-07-16 ENCOUNTER — Other Ambulatory Visit: Payer: Self-pay | Admitting: Adult Health

## 2018-07-16 DIAGNOSIS — I10 Essential (primary) hypertension: Secondary | ICD-10-CM

## 2018-07-16 NOTE — Telephone Encounter (Signed)
Request denied.

## 2018-07-16 NOTE — Telephone Encounter (Signed)
I don't think she is taking this medication any longer. She is due for CPE

## 2018-07-16 NOTE — Telephone Encounter (Signed)
Is pt still taking this medication?  I see HCTZ last filled.

## 2018-07-19 ENCOUNTER — Other Ambulatory Visit: Payer: Self-pay | Admitting: Adult Health

## 2018-07-19 DIAGNOSIS — I10 Essential (primary) hypertension: Secondary | ICD-10-CM

## 2018-07-22 ENCOUNTER — Telehealth: Payer: Self-pay | Admitting: Family Medicine

## 2018-07-22 NOTE — Telephone Encounter (Signed)
Sent to the pharmacy by e-scribe.  Pt now scheduled for 08/20/18.

## 2018-07-22 NOTE — Telephone Encounter (Signed)
Mikayla Sweeney, pt had x-ray but I do not see where this was resulted.  Did you speak to the pt?

## 2018-07-22 NOTE — Telephone Encounter (Signed)
Left a message for a return call.  CRM created.  Pt is past due for cpx.  See refill encounter from 04/18/18.

## 2018-07-22 NOTE — Telephone Encounter (Signed)
Copied from CRM [id number]. Topic: General - Other >> Jul 22, 2018  4:01 PM Percival SpanishKennedy, Cheryl W wrote: Pt would like Misty to call her she said hse never heard nothing about the MRI that she was to have 2 months ago

## 2018-07-23 NOTE — Telephone Encounter (Signed)
I believe we did speak about the xray and then an MRI was ordered due to spinal stenosis and the numbness she was feeling in her arm and hand. Per notes, it looks like it had to have a pre-auth and then she refused? It is kind of hard to tell.

## 2018-07-23 NOTE — Telephone Encounter (Signed)
Left a message for a return call.

## 2018-07-24 NOTE — Telephone Encounter (Signed)
Left a message for a return call.

## 2018-07-24 NOTE — Telephone Encounter (Signed)
Mikayla KeenCory, spoke to the pt and she wants to proceed with the MRI.  It was order with and without contrast.  Will she need lab work?

## 2018-07-27 NOTE — Telephone Encounter (Signed)
Please have her come in for an office visit since this was ordered so long ago

## 2018-07-29 NOTE — Telephone Encounter (Signed)
Left a detailed message on verified voice mail. Asked patient to call back to schedule an appointment. CRM created.

## 2018-07-30 NOTE — Telephone Encounter (Signed)
Patient is scheduled for CPX with Kandee Keenory on 9/11

## 2018-07-31 NOTE — Telephone Encounter (Signed)
Called and discussed note with patient. Patient has been advised that a sperate appointment is not needed. Patient is aware this will be discussed at her appointment on 08/20/18.

## 2018-07-31 NOTE — Telephone Encounter (Signed)
Since patient is scheduled is for CPE on 08/20/18, does she need to be seen prior to appt? CB# [phone]

## 2018-08-09 ENCOUNTER — Other Ambulatory Visit: Payer: Self-pay | Admitting: Adult Health

## 2018-08-09 ENCOUNTER — Other Ambulatory Visit: Payer: Self-pay | Admitting: Family Medicine

## 2018-08-09 DIAGNOSIS — I10 Essential (primary) hypertension: Secondary | ICD-10-CM

## 2018-08-12 NOTE — Telephone Encounter (Signed)
Cory Patient 

## 2018-08-12 NOTE — Telephone Encounter (Signed)
Cory please advise on the refill of the naproxen:    Last ov--03/07/2018 Last fill--06/06/18 for #60 tablets   Thanks

## 2018-08-20 ENCOUNTER — Encounter: Payer: Medicare HMO | Admitting: Adult Health

## 2018-08-20 DIAGNOSIS — Z0289 Encounter for other administrative examinations: Secondary | ICD-10-CM

## 2018-09-02 ENCOUNTER — Encounter: Payer: Self-pay | Admitting: Adult Health

## 2018-09-02 ENCOUNTER — Ambulatory Visit (INDEPENDENT_AMBULATORY_CARE_PROVIDER_SITE_OTHER): Payer: Medicare HMO | Admitting: Adult Health

## 2018-09-02 VITALS — BP 120/78 | HR 78 | Temp 98.0°F | Wt 198.2 lb

## 2018-09-02 DIAGNOSIS — M5412 Radiculopathy, cervical region: Secondary | ICD-10-CM | POA: Diagnosis not present

## 2018-09-02 DIAGNOSIS — H00011 Hordeolum externum right upper eyelid: Secondary | ICD-10-CM

## 2018-09-02 MED ORDER — METHYLPREDNISOLONE 4 MG PO TBPK: ORAL_TABLET | ORAL | 0 refills | Status: DC

## 2018-09-02 MED ORDER — IBUPROFEN 600 MG PO TABS: 600.0000 mg | ORAL_TABLET | Freq: Three times a day (TID) | ORAL | 0 refills | Status: DC | PRN

## 2018-09-02 NOTE — Progress Notes (Signed)
Subjective:    Patient ID: Mikayla Sweeney, female    DOB: 10-Apr-1961, 57 y.o.   MRN: [id number]  HPI  57 year old female who  has a past medical history of Anxiety, Asthma, B12 deficiency, Bipolar 1 disorder (HCC), CHF (congestive heart failure) (HCC), Epilepsy (HCC), Hypertension, and Sarcoidosis. She presents to the office today for concern of " pink eye".  Per patient reports she has had less than 24 hours of right upper eye lid swelling, itching, and pain.  She denies any drainage or discharge from her eye.  Additionally, she reports that she is ready to have an MRI of the cervical spine done.  She has had an ongoing issue of cervical spine pain with numbness and tingling in bilateral arms and hands.  MRI was originally ordered and may have 2019 but she had some issues with insurance and could not get it done at this time.  She has been using Excedrin without relief.  He feels as though the discomfort is getting worse.  He done in May 2019 showed mild disc height loss and hypertrophy at C5-C6.  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  She denies any issues with grip strength when holding a cup but feels as though she has loss of grip strength when opening a jar  Review of Systems See HPI   Past Medical History:  Diagnosis Date  . Anxiety   . Asthma   . B12 deficiency   . Bipolar 1 disorder (HCC)   . CHF (congestive heart failure) (HCC)   . Epilepsy (HCC)   . Hypertension   . Sarcoidosis     Social History   Socioeconomic History  . Marital status: Married    Spouse name: Not on file  . Number of children: Not on file  . Years of education: Not on file  . Highest education level: Not on file  Occupational History  . Not on file  Social Needs  . Financial resource strain: Not on file  . Food insecurity:    Worry: Not on file    Inability: Not on file  . Transportation needs:    Medical: Not on file    Non-medical: Not on file  Tobacco Use  . Smoking  status: Never Smoker  . Smokeless tobacco: Never Used  Substance and Sexual Activity  . Alcohol use: No  . Drug use: No  . Sexual activity: Not on file  Lifestyle  . Physical activity:    Days per week: Not on file    Minutes per session: Not on file  . Stress: Not on file  Relationships  . Social connections:    Talks on phone: Not on file    Gets together: Not on file    Attends religious service: Not on file    Active member of club or organization: Not on file    Attends meetings of clubs or organizations: Not on file    Relationship status: Not on file  . Intimate partner violence:    Fear of current or ex partner: Not on file    Emotionally abused: Not on file    Physically abused: Not on file    Forced sexual activity: Not on file  Other Topics Concern  . Not on file  Social History Narrative   She has her own business    Married    Three children    5 grandchildren       She likes  to spend time with her grandchildren     Past Surgical History:  Procedure Laterality Date  . TONSILLECTOMY    . TUBAL LIGATION      Family History  Problem Relation Age of Onset  . Pancreatic cancer Mother   . Hypertension Mother   . Diabetes Mother   . Heart disease Father   . Gout Father   . Cirrhosis Father   . Heart disease Sister        CHF    No Known Allergies  Current Outpatient Medications on File Prior to Visit  Medication Sig Dispense Refill  . albuterol (PROVENTIL HFA;VENTOLIN HFA) 108 (90 BASE) MCG/ACT inhaler Inhale 2 puffs into the lungs every 6 (six) hours as needed. For wheeze or shortness of breath    . ALPRAZolam (XANAX) 0.5 MG tablet Take 1 tablet (0.5 mg total) at bedtime as needed by mouth for anxiety. 15 tablet 0  . amLODipine (NORVASC) 10 MG tablet TAKE 1 TABLET BY MOUTH EVERY DAY 30 tablet 0  . carvedilol (COREG) 25 MG tablet Take 1 tablet (25 mg total) by mouth 2 (two) times daily with a meal. 180 tablet 0  . fluticasone (FLONASE) 50 MCG/ACT  nasal spray Place 2 sprays into both nostrils daily. 16 g 6  . furosemide (LASIX) 20 MG tablet Take 1 tablet (20 mg total) by mouth daily. 90 tablet 1  . hydrochlorothiazide (HYDRODIURIL) 25 MG tablet Take 1 tablet (25 mg total) by mouth daily. 30 tablet 0  . losartan (COZAAR) 100 MG tablet TAKE 1 TABLET BY MOUTH EVERY DAY 30 tablet 0  . naproxen (NAPROSYN) 500 MG tablet TAKE 1 TABLET BY MOUTH THREE TIMES A DAY WITH MEALS 60 tablet 0   Current Facility-Administered Medications on File Prior to Visit  Medication Dose Route Frequency Provider Last Rate Last Dose  . cloNIDine (CATAPRES) tablet 0.1 mg  0.1 mg Oral Once Annmargaret Decaprio, NP        BP 120/78 (BP Location: Left Arm, Patient Position: Sitting, Cuff Size: Normal)   Pulse 78   Temp 98 F (36.7 C) (Oral)   Wt 198 lb 3.2 oz (89.9 kg)   SpO2 98%   BMI 35.11 kg/m       Objective:   Physical Exam  Constitutional: She is oriented to person, place, and time. She appears well-developed and well-nourished. No distress.  Eyes: Pupils are equal, round, and reactive to light. Conjunctivae and EOM are normal. Right eye exhibits hordeolum. Right eye exhibits no chemosis, no discharge and no exudate. Left eye exhibits no chemosis, no discharge, no exudate and no hordeolum. No foreign body present in the left eye. No scleral icterus.  Cardiovascular: Normal rate, regular rhythm, normal heart sounds and intact distal pulses.  Pulmonary/Chest: Effort normal and breath sounds normal.  Musculoskeletal: Normal range of motion. She exhibits no edema, tenderness or deformity.  Neurological: She is alert and oriented to person, place, and time. She has normal strength and normal reflexes. No cranial nerve deficit or sensory deficit.  -grip strength within normal limits -negative empty can test  Skin: Skin is warm and dry. She is not diaphoretic.  Psychiatric: She has a normal mood and affect. Her behavior is normal. Judgment and thought content normal.   Nursing note and vitals reviewed.     Assessment & Plan:  1. Cervical radiculopathy - methylPREDNISolone (MEDROL DOSEPAK) 4 MG TBPK tablet; Take as directed  Dispense: 21 tablet; Refill: 0 - ibuprofen (ADVIL,MOTRIN) 600 MG tablet;  Take 1 tablet (600 mg total) by mouth every 8 (eight) hours as needed.  Dispense: 30 tablet; Refill: 0 - MR Cervical Spine Wo Contrast; Future - consider referral to spine 2. Hordeolum externum of right upper eyelid - Advised warm compress and lid massage - Follow up as needed  Shirline Frees, NP

## 2018-09-03 ENCOUNTER — Other Ambulatory Visit: Payer: Self-pay | Admitting: Adult Health

## 2018-09-03 DIAGNOSIS — I10 Essential (primary) hypertension: Secondary | ICD-10-CM

## 2018-09-19 ENCOUNTER — Other Ambulatory Visit: Payer: Medicare HMO

## 2018-10-15 ENCOUNTER — Other Ambulatory Visit: Payer: Self-pay | Admitting: Adult Health

## 2018-10-15 DIAGNOSIS — I10 Essential (primary) hypertension: Secondary | ICD-10-CM

## 2018-10-27 ENCOUNTER — Ambulatory Visit: Payer: Self-pay | Admitting: *Deleted

## 2018-10-27 NOTE — Telephone Encounter (Signed)
Attempted to contact pt; left message on voicemail [phone].

## 2018-10-27 NOTE — Telephone Encounter (Signed)
Patient phones with headache and nausea intermittently for 24 hours. 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 Mrs. Mikayla Sweeney has experienced chills several times over the last 2 days but doesn't know if she has fever. Reports she ate breakfast this morning and began a constant nauseous feeling since. No vomiting/diarrhea. She is sipping on ginger ale and has taken pepto with slight improvement with the nausea. Advice care given per protocol including using lysol wipes to disinfect behind the child. Reviewed symptoms requiring a call back. Stated she understood. Reason for Disposition . Unexplained nausea  Answer Assessment - Initial Assessment Questions 1. NAUSEA SEVERITY: "How bad is the nausea?" (e.g., mild, moderate, severe; dehydration, weight loss)   - MILD: loss of appetite without change in eating habits   - MODERATE: decreased oral intake without significant weight loss, dehydration, or malnutrition   - SEVERE: inadequate caloric or fluid intake, significant weight loss, symptoms of dehydration    moderate 2. ONSET: "When did the nausea begin?"     This morning 3. VOMITING: "Any vomiting?" If so, ask: "How many times today?"     none 4. RECURRENT SYMPTOM: "Have you had nausea before?" If so, ask: "When was the last time?" "What happened that time?"     no 5. CAUSE: "What do you think is causing the nausea?"     Keeping granddaughter who was diagnosed with pneumonia with fever yesterday. 6. PREGNANCY: "Is there any chance you are pregnant?" (e.g., unprotected intercourse, missed birth control pill, broken condom)     no  Protocols used: NAUSEA-A-AH

## 2018-10-29 ENCOUNTER — Other Ambulatory Visit: Payer: Self-pay | Admitting: Adult Health

## 2018-10-29 DIAGNOSIS — I10 Essential (primary) hypertension: Secondary | ICD-10-CM

## 2018-10-30 ENCOUNTER — Other Ambulatory Visit: Payer: Self-pay | Admitting: Adult Health

## 2018-10-30 DIAGNOSIS — I10 Essential (primary) hypertension: Secondary | ICD-10-CM

## 2018-10-30 NOTE — Telephone Encounter (Signed)
Denied.  Pt needs cpx and lab work. 

## 2018-10-31 ENCOUNTER — Other Ambulatory Visit: Payer: Self-pay | Admitting: Adult Health

## 2018-10-31 DIAGNOSIS — I10 Essential (primary) hypertension: Secondary | ICD-10-CM

## 2018-11-03 NOTE — Telephone Encounter (Signed)
Denied.  Pt is past due for cpx. 

## 2018-11-04 NOTE — Telephone Encounter (Signed)
Ok to refill for 90 +1 

## 2018-11-18 ENCOUNTER — Other Ambulatory Visit: Payer: Self-pay | Admitting: Family Medicine

## 2018-11-18 DIAGNOSIS — I10 Essential (primary) hypertension: Secondary | ICD-10-CM

## 2018-11-18 MED ORDER — HYDROCHLOROTHIAZIDE 25 MG PO TABS: 25.0000 mg | ORAL_TABLET | Freq: Every day | ORAL | 0 refills | Status: DC

## 2018-11-18 MED ORDER — LOSARTAN POTASSIUM 100 MG PO TABS: 100.0000 mg | ORAL_TABLET | Freq: Every day | ORAL | 0 refills | Status: DC

## 2018-11-18 NOTE — Telephone Encounter (Signed)
Pt is scheduled on 12/12/17 for cpx and lab work.  Instructed to come fasting.  Sent in for 30 days.  Nothing further needed at this time.

## 2018-11-21 DIAGNOSIS — N390 Urinary tract infection, site not specified: Secondary | ICD-10-CM | POA: Diagnosis not present

## 2018-11-28 ENCOUNTER — Other Ambulatory Visit: Payer: Self-pay | Admitting: Adult Health

## 2018-11-28 DIAGNOSIS — I10 Essential (primary) hypertension: Secondary | ICD-10-CM

## 2018-12-01 NOTE — Telephone Encounter (Signed)
Sent to the pharmacy by e-scribe for 30 days.  Pt has upcoming cpx on 12/12/18.

## 2018-12-12 ENCOUNTER — Ambulatory Visit (HOSPITAL_COMMUNITY)
Admission: EM | Admit: 2018-12-12 | Discharge: 2018-12-12 | Discharge status: Against Medical Advice | Payer: Medicare HMO

## 2018-12-12 ENCOUNTER — Encounter: Payer: Medicare HMO | Admitting: Adult Health

## 2018-12-12 NOTE — Progress Notes (Deleted)
   Subjective:    Patient ID: Mikayla Sweeney, female    DOB: 11-Jan-1961, 58 y.o.   MRN: [id number]  HPI Patient presents for yearly preventative medicine examination. She is a pleasant 58 year old female who  has a past medical history of Anxiety, Asthma, B12 deficiency, Bipolar 1 disorder (HCC), CHF (congestive heart failure) (HCC), Epilepsy (HCC), Hypertension, and Sarcoidosis.  Hypertension - Currently prescribed Norvasc 10 mg, Coreg 25 mg, Lasix 20 mg daily, and hydrochlorothiazide 25 mg BP Readings from Last 3 Encounters:  09/02/18 120/78  03/07/18 124/80  02/18/18 (!) 158/104   Epilepsy - questionable diagnosis. No recent seizures or seizure like activities.   All immunizations and health maintenance protocols were reviewed with the patient and needed orders were placed.  Appropriate screening laboratory values were ordered for the patient including screening of hyperlipidemia, renal function and hepatic function.  Medication reconciliation,  past medical history, social history, problem list and allergies were reviewed in detail with the patient  Goals were established with regard to weight loss, exercise, and  diet in compliance with medications  She is up to date on screening colonoscopy. She is due for mammogram   Review of Systems     Objective:   Physical Exam        Assessment & Plan:

## 2018-12-14 ENCOUNTER — Other Ambulatory Visit: Payer: Self-pay

## 2018-12-14 ENCOUNTER — Ambulatory Visit (HOSPITAL_COMMUNITY)
Admission: EM | Admit: 2018-12-14 | Discharge: 2018-12-14 | Disposition: A | Discharge status: Home / Self Care | Payer: Medicare HMO | Attending: Physician Assistant | Admitting: Physician Assistant

## 2018-12-14 ENCOUNTER — Encounter (HOSPITAL_COMMUNITY): Payer: Self-pay | Admitting: *Deleted

## 2018-12-14 DIAGNOSIS — J069 Acute upper respiratory infection, unspecified: Secondary | ICD-10-CM | POA: Diagnosis not present

## 2018-12-14 DIAGNOSIS — B9789 Other viral agents as the cause of diseases classified elsewhere: Secondary | ICD-10-CM | POA: Diagnosis not present

## 2018-12-14 MED ORDER — IPRATROPIUM BROMIDE 0.06 % NA SOLN: 2.0000 | Freq: Four times a day (QID) | NASAL | 0 refills | Status: DC

## 2018-12-14 MED ORDER — ALBUTEROL SULFATE HFA 108 (90 BASE) MCG/ACT IN AERS: 1.0000 | INHALATION_SPRAY | Freq: Four times a day (QID) | RESPIRATORY_TRACT | 0 refills | Status: DC | PRN

## 2018-12-14 MED ORDER — PREDNISONE 50 MG PO TABS: 50.0000 mg | ORAL_TABLET | Freq: Every day | ORAL | 0 refills | Status: DC

## 2018-12-14 MED ORDER — FLUTICASONE PROPIONATE 50 MCG/ACT NA SUSP: 2.0000 | Freq: Every day | NASAL | 0 refills | Status: DC

## 2018-12-14 MED ORDER — ONDANSETRON 4 MG PO TBDP: 4.0000 mg | ORAL_TABLET | Freq: Three times a day (TID) | ORAL | 0 refills | Status: DC | PRN

## 2018-12-14 NOTE — ED Triage Notes (Addendum)
C/O cough x 3-4 days with congestion, sore throat, body aches, HA.  Denies fevers.  C/O wheezing.

## 2018-12-14 NOTE — ED Provider Notes (Signed)
[facility]    CSN: [id number] Arrival date & time: 12/14/18  1326     History   Chief Complaint Chief Complaint  Patient presents with  . Cough    HPI Mikayla Sweeney is a 58 y.o. female.   58 year old female with history of asthma, CHF, HTN, sarcoidosis comes in for 3 to 4-day history of URI symptoms.  Has had rhinorrhea, nasal congestion, sore throat, cough, body aches, headaches.  Denies fever, chills, night sweats.  Has mild chest soreness with coughing.  Denies shortness of breath, orthopnea, leg swelling.  She has noticed some wheezing, but does not have her albuterol inhaler.  OTC cold medication without relief.  Positive sick contact.     Past Medical History:  Diagnosis Date  . Anxiety   . Asthma   . B12 deficiency   . Bipolar 1 disorder (HCC)   . CHF (congestive heart failure) (HCC)   . Epilepsy (HCC)   . Hypertension   . Sarcoidosis     Patient Active Problem List   Diagnosis Date Noted  . Acute diastolic heart failure (HCC) 07/27/2014  . Pulmonary edema 07/26/2014  . Essential hypertension 07/26/2014  . Sarcoidosis 07/26/2014  . Bipolar 1 disorder (HCC) 07/26/2014  . Headache 07/26/2014    Past Surgical History:  Procedure Laterality Date  . TONSILLECTOMY    . TUBAL LIGATION      OB History   No obstetric history on file.      Home Medications    Prior to Admission medications   Medication Sig Start Date End Date Taking? Authorizing Provider  amLODipine (NORVASC) 10 MG tablet TAKE 1 TABLET BY MOUTH EVERY DAY 12/01/18  Yes [name], Kandee Keenory, NP  carvedilol (COREG) 25 MG tablet Take 1 tablet (25 mg total) by mouth 2 (two) times daily with a meal. 08/12/18 12/14/18 Yes [name], Kandee Keenory, NP  furosemide (LASIX) 20 MG tablet Take 1 tablet (20 mg total) by mouth daily. 07/11/17  Yes [name], Kandee Keenory, NP  hydrochlorothiazide (HYDRODIURIL) 25 MG tablet Take 1 tablet (25 mg total) by mouth daily. 11/18/18  Yes [name], Kandee Keenory, NP  losartan (COZAAR) 100  MG tablet Take 1 tablet (100 mg total) by mouth daily. 11/18/18  Yes [name], Kandee Keenory, NP  albuterol (PROVENTIL HFA;VENTOLIN HFA) 108 (90 Base) MCG/ACT inhaler Inhale 1-2 puffs into the lungs every 6 (six) hours as needed. For wheeze or shortness of breath 12/14/18   Cathie HoopsYu, Alixandrea Milleson V, PA-C  fluticasone (FLONASE) 50 MCG/ACT nasal spray Place 2 sprays into both nostrils daily. 12/14/18   Cathie HoopsYu, Ruxin Ransome V, PA-C  ibuprofen (ADVIL,MOTRIN) 600 MG tablet Take 1 tablet (600 mg total) by mouth every 8 (eight) hours as needed. 09/02/18   [name], Kandee Keenory, NP  ipratropium (ATROVENT) 0.06 % nasal spray Place 2 sprays into both nostrils 4 (four) times daily. 12/14/18   Cathie HoopsYu, Aili Casillas V, PA-C  ondansetron (ZOFRAN ODT) 4 MG disintegrating tablet Take 1 tablet (4 mg total) by mouth every 8 (eight) hours as needed for nausea or vomiting. 12/14/18   Cathie HoopsYu, Brannen Koppen V, PA-C  predniSONE (DELTASONE) 50 MG tablet Take 1 tablet (50 mg total) by mouth daily. 12/14/18   Belinda FisherYu, Marlos Carmen V, PA-C    Family History Family History  Problem Relation Age of Onset  . Pancreatic cancer Mother   . Hypertension Mother   . Diabetes Mother   . Heart disease Father   . Gout Father   . Cirrhosis Father   . Heart disease Sister  CHF    Social History Social History   Tobacco Use  . Smoking status: Former Games developermoker  . Smokeless tobacco: Never Used  Substance Use Topics  . Alcohol use: No  . Drug use: No     Allergies   Patient has no known allergies.   Review of Systems Review of Systems  Reason unable to perform ROS: See HPI as above.     Physical Exam Triage Vital Signs ED Triage Vitals  Enc Vitals Group     BP 12/14/18 1506 (!) 149/85     Pulse Rate 12/14/18 1506 77     Resp 12/14/18 1506 18     Temp 12/14/18 1506 98.3 F (36.8 C)     Temp Source 12/14/18 1506 Temporal     SpO2 12/14/18 1506 100 %     Weight --      Height --      Head Circumference --      Peak Flow --      Pain Score 12/14/18 1507 8     Pain Loc --      Pain Edu? --       Excl. in GC? --    No data found.  Updated Vital Signs BP (!) 149/85   Pulse 77   Temp 98.3 F (36.8 C) (Temporal)   Resp 18   LMP 12/10/2014   SpO2 100%   Physical Exam Constitutional:      General: She is not in acute distress.    Appearance: She is well-developed. She is not ill-appearing, toxic-appearing or diaphoretic.  HENT:     Head: Normocephalic and atraumatic.     Right Ear: Tympanic membrane, ear canal and external ear normal. Tympanic membrane is not erythematous or bulging.     Left Ear: Tympanic membrane, ear canal and external ear normal. Tympanic membrane is not erythematous or bulging.     Nose: Congestion and rhinorrhea present.     Right Sinus: Frontal sinus tenderness present. No maxillary sinus tenderness.     Left Sinus: Frontal sinus tenderness present. No maxillary sinus tenderness.     Mouth/Throat:     Mouth: Mucous membranes are moist.     Pharynx: Oropharynx is clear. Uvula midline.  Eyes:     Conjunctiva/sclera: Conjunctivae normal.     Pupils: Pupils are equal, round, and reactive to light.  Neck:     Musculoskeletal: Normal range of motion and neck supple.  Cardiovascular:     Rate and Rhythm: Normal rate and regular rhythm.     Heart sounds: Normal heart sounds. No murmur. No friction rub. No gallop.   Pulmonary:     Effort: Pulmonary effort is normal. No respiratory distress.     Breath sounds: Normal breath sounds. No stridor. No decreased breath sounds, wheezing, rhonchi or rales.  Musculoskeletal:     Right lower leg: No edema.     Left lower leg: No edema.  Lymphadenopathy:     Cervical: No cervical adenopathy.  Skin:    General: Skin is warm and dry.  Neurological:     Mental Status: She is alert and oriented to person, place, and time.  Psychiatric:        Behavior: Behavior normal.        Judgment: Judgment normal.      UC Treatments / Results  Labs (all labs ordered are listed, but only abnormal results are  displayed) Labs Reviewed - No data to display  EKG None  Radiology  No results found.  Procedures Procedures (including critical care time)  Medications Ordered in UC Medications - No data to display  Initial Impression / Assessment and Plan / UC Course  I have reviewed the triage vital signs and the nursing notes.  Pertinent labs & imaging results that were available during my care of the patient were reviewed by me and considered in my medical decision making (see chart for details).    Discussed with patient history and exam most consistent with viral URI. Symptomatic treatment as needed. Push fluids. Return precautions given.   Final Clinical Impressions(s) / UC Diagnoses   Final diagnoses:  Viral URI with cough    ED Prescriptions    Medication Sig Dispense Auth. Provider   albuterol (PROVENTIL HFA;VENTOLIN HFA) 108 (90 Base) MCG/ACT inhaler Inhale 1-2 puffs into the lungs every 6 (six) hours as needed. For wheeze or shortness of breath 1 Inhaler Aalani Aikens V, PA-C   predniSONE (DELTASONE) 50 MG tablet Take 1 tablet (50 mg total) by mouth daily. 5 tablet Tlaloc Taddei V, PA-C   fluticasone (FLONASE) 50 MCG/ACT nasal spray Place 2 sprays into both nostrils daily. 1 g Shirely Toren V, PA-C   ipratropium (ATROVENT) 0.06 % nasal spray Place 2 sprays into both nostrils 4 (four) times daily. 15 mL Arvell Pulsifer V, PA-C   ondansetron (ZOFRAN ODT) 4 MG disintegrating tablet Take 1 tablet (4 mg total) by mouth every 8 (eight) hours as needed for nausea or vomiting. 10 tablet Threasa Alpha, [place] 12/14/18 [phone]

## 2018-12-14 NOTE — Discharge Instructions (Addendum)
Start prednisone as directed. Zofran as needed for nausea. Albuterol as needed for wheezing. Start flonase, atrovent nasal spray for nasal congestion/drainage. You can use over the counter nasal saline rinse such as neti pot for nasal congestion. Keep hydrated, your urine should be clear to pale yellow in color. Tylenol/motrin for fever and pain. Monitor for any worsening of symptoms, chest pain, shortness of breath, wheezing, swelling of the throat, follow up for reevaluation.   For sore throat/cough try using a honey-based tea. Use 3 teaspoons of honey with juice squeezed from half lemon. Place shaved pieces of ginger into 1/2-1 cup of water and warm over stove top. Then mix the ingredients and repeat every 4 hours as needed.

## 2018-12-16 ENCOUNTER — Telehealth (HOSPITAL_COMMUNITY): Payer: Self-pay | Admitting: Emergency Medicine

## 2018-12-16 ENCOUNTER — Ambulatory Visit: Payer: Self-pay

## 2018-12-16 NOTE — Telephone Encounter (Signed)
Patient called in with c/o "cough." She says "I went to the UC on 12/14/18 and was put on prednisone, but it's not helping. Will Cory call me in something else?" I advised she will need to be seen in the office before medications are prescribed, she verbalized understanding. I asked about the cough, she says "it started on New Years, worse at night, I'm up all night coughing. Nothing is coming up when I cough, but it's congested. I don't have a fever." I asked about other symptoms, she says "wheezing, chest congestion, nausea, headache, runny nose." According to protocol, see PCP within 3 days, appointment scheduled for tomorrow at 1100 with Shirline Frees, NP, care advice given, patient verbalized understanding.  Reason for Disposition . [1] Continuous (nonstop) coughing interferes with work or school AND [2] no improvement using cough treatment per protocol  Answer Assessment - Initial Assessment Questions 1. ONSET: "When did the cough begin?"      12/10/18 2. SEVERITY: "How bad is the cough today?"      Comes and goes, night is worse 3. RESPIRATORY DISTRESS: "Describe your breathing."      Wheezing 4. FEVER: "Do you have a fever?" If so, ask: "What is your temperature, how was it measured, and when did it start?"     No 5. HEMOPTYSIS: "Are you coughing up any blood?" If so ask: "How much?" (flecks, streaks, tablespoons, etc.)     No 6. TREATMENT: "What have you done so far to treat the cough?" (e.g., meds, fluids, humidifier)     Prednisone, nasal spray 7. CARDIAC HISTORY: "Do you have any history of heart disease?" (e.g., heart attack, congestive heart failure)      CHF 8. LUNG HISTORY: "Do you have any history of lung disease?"  (e.g., pulmonary embolus, asthma, emphysema)     Asthma years ago 9. PE RISK FACTORS: "Do you have a history of blood clots?" (or: recent major surgery, recent prolonged travel, bedridden)     No 10. OTHER SYMPTOMS: "Do you have any other symptoms? (e.g., runny nose,  wheezing, chest pain)      Wheezing, chest congestion, nausea, headache, runny nose 11. PREGNANCY: "Is there any chance you are pregnant?" "When was your last menstrual period?"       No 12. TRAVEL: "Have you traveled out of the country in the last month?" (e.g., travel history, exposures)       No  Protocols used: COUGH - ACUTE NON-PRODUCTIVE-A-AH

## 2018-12-16 NOTE — Telephone Encounter (Signed)
Pt called requesting antibiotics for her URI. Chart reviewed by Dr. Delton See, per Dr. Delton See, pt does not meet criteria for antibiotics and she will not fill any for patient. Pt unhappy with plan, requesting to speak to someone "above that doctor". Patient given number for the office of patient experience.

## 2018-12-17 ENCOUNTER — Encounter: Payer: Self-pay | Admitting: Adult Health

## 2018-12-17 ENCOUNTER — Ambulatory Visit (INDEPENDENT_AMBULATORY_CARE_PROVIDER_SITE_OTHER): Payer: Medicare HMO | Admitting: Adult Health

## 2018-12-17 VITALS — BP 130/88 | Temp 98.0°F | Wt 192.0 lb

## 2018-12-17 DIAGNOSIS — J0140 Acute pansinusitis, unspecified: Secondary | ICD-10-CM | POA: Diagnosis not present

## 2018-12-17 DIAGNOSIS — I10 Essential (primary) hypertension: Secondary | ICD-10-CM | POA: Diagnosis not present

## 2018-12-17 MED ORDER — DOXYCYCLINE HYCLATE 100 MG PO CAPS: 100.0000 mg | ORAL_CAPSULE | Freq: Two times a day (BID) | ORAL | 0 refills | Status: DC

## 2018-12-17 MED ORDER — ONDANSETRON 4 MG PO TBDP: 4.0000 mg | ORAL_TABLET | Freq: Three times a day (TID) | ORAL | 0 refills | Status: DC | PRN

## 2018-12-17 MED ORDER — HYDROCHLOROTHIAZIDE 25 MG PO TABS: 25.0000 mg | ORAL_TABLET | Freq: Every day | ORAL | 0 refills | Status: DC

## 2018-12-17 NOTE — Progress Notes (Signed)
   Subjective:    Patient ID: Mikayla Sweeney, female    DOB: January 01, 1961, 58 y.o.   MRN: [id number]  Was seen by urgent care 3 days ago and treated for viral URI with flonase and prednisone. She reports no improvement in her symptoms   URI   This is a new problem. The current episode started in the past 7 days. The problem has been gradually worsening. The maximum temperature recorded prior to her arrival was 100.4 - 100.9 F. Associated symptoms include congestion, coughing, headaches, nausea, rhinorrhea, sinus pain and wheezing. Pertinent negatives include no ear pain, plugged ear sensation or sore throat.      Review of Systems  Constitutional: Positive for diaphoresis, fatigue and fever. Negative for activity change, appetite change and chills.  HENT: Positive for congestion, rhinorrhea, sinus pressure and sinus pain. Negative for ear discharge, ear pain, sore throat, tinnitus and trouble swallowing.   Eyes: Negative.   Respiratory: Positive for cough and wheezing. Negative for shortness of breath.   Cardiovascular: Negative.   Gastrointestinal: Positive for nausea.  Musculoskeletal: Negative.   Neurological: Positive for headaches.  Psychiatric/Behavioral: Negative.        Objective:   Physical Exam Vitals signs and nursing note reviewed.  Constitutional:      Appearance: Normal appearance. She is ill-appearing and diaphoretic.  HENT:     Right Ear: Tympanic membrane, ear canal and external ear normal. There is no impacted cerumen.     Left Ear: Tympanic membrane, ear canal and external ear normal. There is no impacted cerumen.     Nose: Mucosal edema and congestion present.     Right Turbinates: Enlarged.     Left Turbinates: Enlarged and swollen.     Right Sinus: Maxillary sinus tenderness and frontal sinus tenderness present.     Left Sinus: Maxillary sinus tenderness and frontal sinus tenderness present.     Mouth/Throat:     Pharynx: Oropharynx is clear. Uvula midline.    Cardiovascular:     Rate and Rhythm: Normal rate and regular rhythm.     Pulses: Normal pulses.     Heart sounds: Normal heart sounds.  Pulmonary:     Effort: Pulmonary effort is normal.     Breath sounds: Normal breath sounds.  Neurological:     Mental Status: She is alert.  Psychiatric:        Mood and Affect: Mood normal.        Behavior: Behavior normal.        Thought Content: Thought content normal.       Assessment & Plan:  1. Acute non-recurrent pansinusitis - doxycycline (VIBRAMYCIN) 100 MG capsule; Take 1 capsule (100 mg total) by mouth 2 (two) times daily.  Dispense: 14 capsule; Refill: 0 - ondansetron (ZOFRAN ODT) 4 MG disintegrating tablet; Take 1 tablet (4 mg total) by mouth every 8 (eight) hours as needed for nausea or vomiting.  Dispense: 10 tablet; Refill: 0  2. Essential hypertension  - hydrochlorothiazide (HYDRODIURIL) 25 MG tablet; Take 1 tablet (25 mg total) by mouth daily.  Dispense: 90 tablet; Refill: 0   Shirline Frees, NP

## 2018-12-18 ENCOUNTER — Other Ambulatory Visit: Payer: Self-pay | Admitting: Adult Health

## 2018-12-18 DIAGNOSIS — I10 Essential (primary) hypertension: Secondary | ICD-10-CM

## 2018-12-18 MED ORDER — CARVEDILOL 25 MG PO TABS: 25.0000 mg | ORAL_TABLET | Freq: Two times a day (BID) | ORAL | 0 refills | Status: DC

## 2018-12-18 NOTE — Telephone Encounter (Signed)
Copied from CRM [id number]. Topic: Quick Communication - Rx Refill/Question >> Dec 18, 2018  3:23 PM Jilda Roche wrote: Medication: carvedilol (COREG) 25 MG tablet   Has the patient contacted their pharmacy? No. (Agent: If no, request that the patient contact the pharmacy for the refill.) (Agent: If yes, when and what did the pharmacy advise?)  Preferred Pharmacy (with phone number or street name): [id number] [place], [place] - [address] AT [facility] [address] [phone] (Phone) [phone] (Fax)    Agent: Please be advised that RX refills may take up to 3 business days. We ask that you follow-up with your pharmacy.

## 2018-12-18 NOTE — Telephone Encounter (Signed)
Requested Prescriptions  Pending Prescriptions Disp Refills  . carvedilol (COREG) 25 MG tablet 180 tablet 0    Sig: Take 1 tablet (25 mg total) by mouth 2 (two) times daily with a meal.     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

## 2018-12-18 NOTE — Telephone Encounter (Signed)
Request coming from a different pharmacy than usual.  Request denied.

## 2018-12-22 ENCOUNTER — Ambulatory Visit: Payer: Self-pay | Admitting: *Deleted

## 2018-12-22 NOTE — Telephone Encounter (Signed)
Pt reports back pain, onset Saturday. States pain is mid back "Between shoulder blades."  Rates at 10/10 with coughing, 9/10 at rest. Does not radiate, no worse with inspiration. Denies SOB, CP. Seen at [facility] 12/14/2018, URI. States still congested and coughing "But better." Pt has taken ES tylenol "Helps a little." Also reports pain worsens "When tilting my neck back." Pt has appt for physical with C. [name] Wednesday 12/24/2018. Offered earlier acute appt, declined, recommended UC, declined. Pt requesting a muscle relaxer be called in to pharmacy until appt on Wednesday. Pt made aware not common practice. Care advise given, assured pt [place] would alert practice of request. Please advise: [phone]  Reason for Disposition . [1] SEVERE back pain (e.g., excruciating, unable to do any normal activities) AND [2] not improved 2 hours after pain medicine  Answer Assessment - Initial Assessment Questions 1. ONSET: "When did the pain begin?"      Saturday 2. LOCATION: "Where does it hurt?" (upper, mid or lower back)     Mid back between shoulder blades 3. SEVERITY: "How bad is the pain?"  (e.g., Scale 1-10; mild, moderate, or severe)   - MILD (1-3): doesn't interfere with normal activities    - MODERATE (4-7): interferes with normal activities or awakens from sleep    - SEVERE (8-10): excruciating pain, unable to do any normal activities      10/10 with cough, 9/10 at rest  4. PATTERN: "Is the pain constant?" (e.g., yes, no; constant, intermittent)      Constant, worse when coughing 5. RADIATION: "Does the pain shoot into your legs or elsewhere?"    no 6. CAUSE:  "What do you think is causing the back pain?"      unsure 7. BACK OVERUSE:  "Any recent lifting of heavy objects, strenuous work or exercise?"     no 8. MEDICATIONS: "What have you taken so far for the pain?" (e.g., nothing, acetaminophen, NSAIDS)     ES tylenol, helps a little 9. NEUROLOGIC SYMPTOMS: "Do you have any weakness, numbness, or  problems with bowel/bladder control?"     no 10. OTHER SYMPTOMS: "Do you have any other symptoms?" (e.g., fever, abdominal pain, burning with urination, blood in urine)       "Full of cold"  Congested but better, seen at [facility] 12/14/2018  Protocols used: BACK PAIN-A-AH

## 2018-12-23 MED ORDER — CYCLOBENZAPRINE HCL 10 MG PO TABS: 10.0000 mg | ORAL_TABLET | Freq: Every day | ORAL | 0 refills | Status: DC

## 2018-12-23 NOTE — Telephone Encounter (Signed)
Cory, please advise is muscle relaxer can be sent in until seen for CPX.  Thanks!!

## 2018-12-23 NOTE — Addendum Note (Signed)
Addended by: Raj JanusADKINS, Aishwarya Shiplett T on: 12/23/2018 08:43 AM   Modules accepted: Orders

## 2018-12-23 NOTE — Telephone Encounter (Signed)
Flexeril 10 mg QHS x 5 days PRN

## 2018-12-23 NOTE — Addendum Note (Signed)
Addended by: Raj Janus T on: 12/23/2018 11:51 AM   Modules accepted: Orders

## 2018-12-23 NOTE — Telephone Encounter (Signed)
Spoke to the pt and rx sent to the pharmacy.  Nothing further needed.

## 2018-12-23 NOTE — Telephone Encounter (Signed)
Left a message for a return call.  Pt has multiple pharmacies listed.  Need to know the correct one to send medication.

## 2018-12-24 ENCOUNTER — Ambulatory Visit (INDEPENDENT_AMBULATORY_CARE_PROVIDER_SITE_OTHER): Payer: Medicare HMO | Admitting: Adult Health

## 2018-12-24 ENCOUNTER — Encounter: Payer: Self-pay | Admitting: Adult Health

## 2018-12-24 VITALS — BP 130/80 | Temp 98.3°F | Ht 64.0 in | Wt 195.0 lb

## 2018-12-24 DIAGNOSIS — Z114 Encounter for screening for human immunodeficiency virus [HIV]: Secondary | ICD-10-CM

## 2018-12-24 DIAGNOSIS — K2100 Gastro-esophageal reflux disease with esophagitis, without bleeding: Secondary | ICD-10-CM

## 2018-12-24 DIAGNOSIS — R69 Illness, unspecified: Secondary | ICD-10-CM | POA: Diagnosis not present

## 2018-12-24 DIAGNOSIS — I10 Essential (primary) hypertension: Secondary | ICD-10-CM | POA: Diagnosis not present

## 2018-12-24 DIAGNOSIS — I5031 Acute diastolic (congestive) heart failure: Secondary | ICD-10-CM | POA: Diagnosis not present

## 2018-12-24 DIAGNOSIS — E538 Deficiency of other specified B group vitamins: Secondary | ICD-10-CM

## 2018-12-24 DIAGNOSIS — Z23 Encounter for immunization: Secondary | ICD-10-CM | POA: Diagnosis not present

## 2018-12-24 DIAGNOSIS — Z0000 Encounter for general adult medical examination without abnormal findings: Secondary | ICD-10-CM

## 2018-12-24 DIAGNOSIS — Z1159 Encounter for screening for other viral diseases: Secondary | ICD-10-CM

## 2018-12-24 LAB — CBC WITH DIFFERENTIAL/PLATELET
Basophils Absolute: 0 10*3/uL (ref 0.0–0.1)
Basophils Relative: 0.5 % (ref 0.0–3.0)
Eosinophils Absolute: 0.1 10*3/uL (ref 0.0–0.7)
Eosinophils Relative: 1.1 % (ref 0.0–5.0)
HCT: 42.1 % (ref 36.0–46.0)
Hemoglobin: 14 g/dL (ref 12.0–15.0)
Lymphocytes Relative: 31.2 % (ref 12.0–46.0)
Lymphs Abs: 2.4 10*3/uL (ref 0.7–4.0)
MCHC: 33.4 g/dL (ref 30.0–36.0)
MCV: 90.3 fl (ref 78.0–100.0)
Monocytes Absolute: 0.6 10*3/uL (ref 0.1–1.0)
Monocytes Relative: 7.8 % (ref 3.0–12.0)
NEUTROS ABS: 4.5 10*3/uL (ref 1.4–7.7)
Neutrophils Relative %: 59.4 % (ref 43.0–77.0)
PLATELETS: 228 10*3/uL (ref 150.0–400.0)
RBC: 4.67 Mil/uL (ref 3.87–5.11)
RDW: 14.1 % (ref 11.5–15.5)
WBC: 7.6 10*3/uL (ref 4.0–10.5)

## 2018-12-24 LAB — LIPID PANEL
Cholesterol: 217 mg/dL — ABNORMAL HIGH (ref 0–200)
HDL: 65 mg/dL (ref >39.00)
LDL Cholesterol: 132 mg/dL — ABNORMAL HIGH (ref 0–99)
NonHDL: 151.58
Total CHOL/HDL Ratio: 3
Triglycerides: 97 mg/dL (ref 0.0–149.0)
VLDL: 19.4 mg/dL (ref 0.0–40.0)

## 2018-12-24 LAB — BASIC METABOLIC PANEL
BUN: 21 mg/dL (ref 6–23)
CO2: 34 mEq/L — ABNORMAL HIGH (ref 19–32)
Calcium: 9 mg/dL (ref 8.4–10.5)
Chloride: 104 mEq/L (ref 96–112)
Creatinine, Ser: 0.83 mg/dL (ref 0.40–1.20)
GFR: 90.91 mL/min (ref >60.00)
Glucose, Bld: 96 mg/dL (ref 70–99)
Potassium: 3.9 mEq/L (ref 3.5–5.1)
Sodium: 142 mEq/L (ref 135–145)

## 2018-12-24 LAB — HEPATIC FUNCTION PANEL
ALT: 13 U/L (ref 0–35)
AST: 13 U/L (ref 0–37)
Albumin: 3.7 g/dL (ref 3.5–5.2)
Alkaline Phosphatase: 61 U/L (ref 39–117)
Bilirubin, Direct: 0.1 mg/dL (ref 0.0–0.3)
Total Bilirubin: 0.4 mg/dL (ref 0.2–1.2)
Total Protein: 6.6 g/dL (ref 6.0–8.3)

## 2018-12-24 LAB — HEMOGLOBIN A1C: HEMOGLOBIN A1C: 6 % (ref 4.6–6.5)

## 2018-12-24 LAB — VITAMIN B12: Vitamin B-12: 305 pg/mL (ref 211–911)

## 2018-12-24 LAB — TSH: TSH: 1.29 u[IU]/mL (ref 0.35–4.50)

## 2018-12-24 MED ORDER — LOSARTAN POTASSIUM 100 MG PO TABS: 100.0000 mg | ORAL_TABLET | Freq: Every day | ORAL | 3 refills | Status: DC

## 2018-12-24 MED ORDER — AMLODIPINE BESYLATE 10 MG PO TABS: 10.0000 mg | ORAL_TABLET | Freq: Every day | ORAL | 3 refills | Status: DC

## 2018-12-24 MED ORDER — OMEPRAZOLE 20 MG PO CPDR: 20.0000 mg | DELAYED_RELEASE_CAPSULE | Freq: Every day | ORAL | 3 refills | Status: DC

## 2018-12-24 MED ORDER — OMEPRAZOLE 20 MG PO CPDR: 20.0000 mg | DELAYED_RELEASE_CAPSULE | Freq: Every day | ORAL | 0 refills | Status: DC

## 2018-12-24 NOTE — Patient Instructions (Signed)
It was great seeing you today   We will follow up with you regarding your blood work   Please make an appointment with GYN

## 2018-12-24 NOTE — Addendum Note (Signed)
Addended by: Raj Janus T on: 12/24/2018 11:38 AM   Modules accepted: Orders

## 2018-12-24 NOTE — Progress Notes (Signed)
Subjective:    Patient ID: Mikayla Sweeney, female    DOB: [birth year], 58 y.o.   MRN: [id number]  HPI Patient presents for yearly preventative medicine examination. She is a pleasant 58 year old female who  has a past medical history of Anxiety, Asthma, B12 deficiency, Bipolar 1 disorder (HCC), CHF (congestive heart failure) (HCC), Epilepsy (HCC), Hypertension, and Sarcoidosis.  Hypertension - Currently prescribed Norvasc 10 mg, Coreg 25 mg, HCTZ 25 mg, and Cozaar 100 mg. She denies headaches, blurred vision, lightheadedness, or dizziness.  BP Readings from Last 3 Encounters:  12/24/18 130/80  12/17/18 130/88  12/14/18 (!) 149/85   CHF - Controlled with Lasix. Has never been seen by cardiology. She denies chest pain, SOB, or lower extremity swelling.   Bipolar- controlled off medications   GERD - history of GERD. Has not been on medications for some time now. She reports worsening symptoms as of lately. She reports heart burn and epigastric pain   All immunizations and health maintenance protocols were reviewed with the patient and needed orders were placed.  Appropriate screening laboratory values were ordered for the patient including screening of hyperlipidemia, renal function and hepatic function. If indicated by BPH, a PSA was ordered.  Medication reconciliation,  past medical history, social history, problem list and allergies were reviewed in detail with the patient  Goals were established with regard to weight loss, exercise, and  diet in compliance with medications. She has joined a gym and plans to start going.  Wt Readings from Last 3 Encounters:  12/24/18 195 lb (88.5 kg)  12/17/18 192 lb (87.1 kg)  09/02/18 198 lb 3.2 oz (89.9 kg)   She is utd on screening colonoscopy. She is going to make an appointment with GYN for PAP and mammogram.    Review of Systems  Constitutional: Negative.   HENT: Negative.   Eyes: Negative.   Respiratory: Negative.   Cardiovascular:  Negative.   Gastrointestinal: Positive for abdominal pain. Negative for abdominal distention, blood in stool, diarrhea, nausea and vomiting.  Endocrine: Negative.   Genitourinary: Negative.   Musculoskeletal: Positive for arthralgias and back pain.  Skin: Negative.   Allergic/Immunologic: Negative.   Neurological: Negative.   Hematological: Negative.   Psychiatric/Behavioral: Negative.   All other systems reviewed and are negative.  Past Medical History:  Diagnosis Date  . Anxiety   . Asthma   . B12 deficiency   . Bipolar 1 disorder (HCC)   . CHF (congestive heart failure) (HCC)   . Epilepsy (HCC)   . Hypertension   . Sarcoidosis     Social History   Socioeconomic History  . Marital status: Married    Spouse name: Not on file  . Number of children: Not on file  . Years of education: Not on file  . Highest education level: Not on file  Occupational History  . Not on file  Social Needs  . Financial resource strain: Not on file  . Food insecurity:    Worry: Not on file    Inability: Not on file  . Transportation needs:    Medical: Not on file    Non-medical: Not on file  Tobacco Use  . Smoking status: Former Games developermoker  . Smokeless tobacco: Never Used  Substance and Sexual Activity  . Alcohol use: No  . Drug use: No  . Sexual activity: Not on file  Lifestyle  . Physical activity:    Days per week: Not on file    Minutes per  session: Not on file  . Stress: Not on file  Relationships  . Social connections:    Talks on phone: Not on file    Gets together: Not on file    Attends religious service: Not on file    Active member of club or organization: Not on file    Attends meetings of clubs or organizations: Not on file    Relationship status: Not on file  . Intimate partner violence:    Fear of current or ex partner: Not on file    Emotionally abused: Not on file    Physically abused: Not on file    Forced sexual activity: Not on file  Other Topics Concern  .  Not on file  Social History Narrative   She has her own business    Married    Three children    5 grandchildren       She likes to spend time with her grandchildren     Past Surgical History:  Procedure Laterality Date  . TONSILLECTOMY    . TUBAL LIGATION      Family History  Problem Relation Age of Onset  . Pancreatic cancer Mother   . Hypertension Mother   . Diabetes Mother   . Heart disease Father   . Gout Father   . Cirrhosis Father   . Heart disease Sister        CHF    No Known Allergies  Current Outpatient Medications on File Prior to Visit  Medication Sig Dispense Refill  . albuterol (PROVENTIL HFA;VENTOLIN HFA) 108 (90 Base) MCG/ACT inhaler Inhale 1-2 puffs into the lungs every 6 (six) hours as needed. For wheeze or shortness of breath 1 Inhaler 0  . amLODipine (NORVASC) 10 MG tablet TAKE 1 TABLET BY MOUTH EVERY DAY 30 tablet 0  . carvedilol (COREG) 25 MG tablet Take 1 tablet (25 mg total) by mouth 2 (two) times daily with a meal. 180 tablet 0  . cyclobenzaprine (FLEXERIL) 10 MG tablet Take 1 tablet (10 mg total) by mouth at bedtime. 5 tablet 0  . fluticasone (FLONASE) 50 MCG/ACT nasal spray Place 2 sprays into both nostrils daily. 1 g 0  . furosemide (LASIX) 20 MG tablet Take 1 tablet (20 mg total) by mouth daily. 90 tablet 1  . hydrochlorothiazide (HYDRODIURIL) 25 MG tablet Take 1 tablet (25 mg total) by mouth daily. 90 tablet 0  . ibuprofen (ADVIL,MOTRIN) 600 MG tablet Take 1 tablet (600 mg total) by mouth every 8 (eight) hours as needed. 30 tablet 0  . ipratropium (ATROVENT) 0.06 % nasal spray Place 2 sprays into both nostrils 4 (four) times daily. 15 mL 0  . losartan (COZAAR) 100 MG tablet Take 1 tablet (100 mg total) by mouth daily. 30 tablet 0  . ondansetron (ZOFRAN ODT) 4 MG disintegrating tablet Take 1 tablet (4 mg total) by mouth every 8 (eight) hours as needed for nausea or vomiting. (Patient not taking: Reported on 12/24/2018) 10 tablet 0   No current  facility-administered medications on file prior to visit.     BP 130/80   Temp 98.3 F (36.8 C)   Ht 5\' 4"  (1.626 m)   Wt 195 lb (88.5 kg)   LMP 12/10/2014   BMI 33.47 kg/m       Objective:   Physical Exam Vitals signs and nursing note reviewed.  Constitutional:      Appearance: Normal appearance.  HENT:     Head: Normocephalic and atraumatic.  Right Ear: Tympanic membrane, ear canal and external ear normal. There is no impacted cerumen.     Left Ear: Tympanic membrane, ear canal and external ear normal. There is no impacted cerumen.     Nose: Nose normal. No congestion or rhinorrhea.     Mouth/Throat:     Mouth: Mucous membranes are dry.  Cardiovascular:     Rate and Rhythm: Normal rate and regular rhythm.     Pulses: Normal pulses.     Heart sounds: Normal heart sounds.  Pulmonary:     Effort: Pulmonary effort is normal. No respiratory distress.     Breath sounds: Normal breath sounds. No stridor. No wheezing, rhonchi or rales.  Chest:     Chest wall: No tenderness.  Abdominal:     General: Abdomen is flat. Bowel sounds are normal.     Palpations: Abdomen is soft.     Tenderness: There is abdominal tenderness in the epigastric area.  Skin:    General: Skin is warm and dry.     Capillary Refill: Capillary refill takes less than 2 seconds.  Neurological:     General: No focal deficit present.     Mental Status: She is alert and oriented to person, place, and time. Mental status is at baseline.        Assessment & Plan:  1. Routine general medical examination at a health care facility - Flu shot given  - Follow up in one year or sooner if needed - Advised follow up with GYN  - Basic metabolic panel - CBC with Differential/Platelet - Hemoglobin A1c - Hepatic function panel - TSH - Lipid panel  2. Essential hypertension - Continue with current therapy  - Basic metabolic panel - CBC with Differential/Platelet - Hemoglobin A1c - Hepatic function  panel - TSH - Lipid panel  3. Acute diastolic heart failure (HCC) - Continue with lasix   4. Encounter for screening for HIV  - HIV Antibody (routine testing w rflx)  5. Need for hepatitis C screening test  - Hep C Antibody  6. Gastroesophageal reflux disease with esophagitis  - omeprazole (PRILOSEC) 20 MG capsule; Take 1 capsule (20 mg total) by mouth daily.  Dispense: 30 capsule; Refill: 0  7. B12 deficiency  - Vitamin B12  Shirline Frees, NP

## 2018-12-25 LAB — HEPATITIS C ANTIBODY
Hepatitis C Ab: NONREACTIVE
SIGNAL TO CUT-OFF: 0.02 (ref <1.00)

## 2018-12-25 LAB — HIV ANTIBODY (ROUTINE TESTING W REFLEX): HIV 1&2 Ab, 4th Generation: NONREACTIVE

## 2019-01-05 ENCOUNTER — Telehealth: Payer: Self-pay | Admitting: *Deleted

## 2019-01-05 NOTE — Telephone Encounter (Signed)
Copied from CRM [id number]. Topic: General - Inquiry >> Jan 05, 2019  1:41 PM Mikayla Sweeney, NT wrote: Reason for CRM: patient states the pain is not getting better that is going into her arms and is needing to reschedule her MRI. Also wanting to know if Shirline Frees can call her something in for her neck pain and arm pain until she has MRI. Please advise.

## 2019-01-05 NOTE — Telephone Encounter (Signed)
Spoke with patient - states that she will wait until Mikayla Sweeney returns 01/06/2019 to have him address this.   Please advise

## 2019-01-06 MED ORDER — PREDNISONE 20 MG PO TABS: ORAL_TABLET | ORAL | 0 refills | Status: DC

## 2019-01-06 NOTE — Telephone Encounter (Signed)
Pt notified to pick up rx from the pharmacy.  Will forward to get phone number for the pt to reschedule.

## 2019-01-06 NOTE — Telephone Encounter (Signed)
We can do 7 days of Prednisone 20 mg to see if this will calm every thing down.

## 2019-01-15 ENCOUNTER — Other Ambulatory Visit: Payer: Medicare HMO

## 2019-01-19 ENCOUNTER — Other Ambulatory Visit: Payer: Medicare HMO

## 2019-01-19 ENCOUNTER — Telehealth: Payer: Self-pay | Admitting: Adult Health

## 2019-01-19 NOTE — Telephone Encounter (Signed)
Copied from CRM [id number]. Topic: Quick Communication - Rx Refill/Question >> Jan 19, 2019  1:07 PM Crist Infante wrote: Medication: 1. tami flu  Pt states her 23 mo old grandchild has the flu and RSV. She has been keeping and been exposed. Would like to know if [name] will call this med in for her?  2. Pain med (something for pain) Pt has finished the prednisone. Pt states she is having her MRI today and having issues with her right arm now. Pt states she is having excruciating pain, cannot lift arm. She states this is all coming from the pain she started with and saw [facility] for.  I advised pt that [name] is out of the office today. Pt verbalized understanding.  [id number] [place], [place] - [address] [phone] (Phone) [phone] (Fax)

## 2019-01-20 MED ORDER — NAPROXEN 500 MG PO TABS: ORAL_TABLET | ORAL | 0 refills | Status: DC

## 2019-01-20 NOTE — Telephone Encounter (Signed)
I don't treat the flu prophylactic. Did the prednisone help when she took it.    She really needs to get the MRI done

## 2019-01-20 NOTE — Telephone Encounter (Signed)
Pt notified of below message.  Will continue with MRI.  Naprosyn sent to the pharmacy.  No further action required.

## 2019-01-20 NOTE — Telephone Encounter (Signed)
Spoke to the pt.  She says she is now having pain that runs from her neck down to her shoulder and into her arm.  She has numbness and finds it hard to lift her arm at times.  I asked the pt if she prednisone helped and she states, "not a lot."  She is out of the naprosyn.  She would like a new order for her neck, back and shoulder.  Informed her that [name] does not treat the flu prophylactic.   Will now forward for new imaging orders if approved.

## 2019-01-20 NOTE — Telephone Encounter (Signed)
We can reorder the MRI of cervical spine as it will give us information that we need. Right now there is no need for a MRI of shoulder. But if she does not go then I cannot do much for her   OK to send in new prescription for Naprosyn

## 2019-02-03 ENCOUNTER — Other Ambulatory Visit: Payer: Medicare HMO

## 2019-02-10 ENCOUNTER — Ambulatory Visit
Admission: RE | Admit: 2019-02-10 | Discharge: 2019-02-10 | Disposition: A | Discharge status: Home / Self Care | Payer: Medicare HMO | Source: Ambulatory Visit | Attending: Adult Health | Admitting: Adult Health

## 2019-02-10 DIAGNOSIS — M5412 Radiculopathy, cervical region: Secondary | ICD-10-CM

## 2019-02-10 DIAGNOSIS — M4802 Spinal stenosis, cervical region: Secondary | ICD-10-CM | POA: Diagnosis not present

## 2019-02-12 ENCOUNTER — Telehealth: Payer: Self-pay | Admitting: Adult Health

## 2019-02-12 NOTE — Telephone Encounter (Signed)
Pt called.  No answer and voicemail is full.  CRM created to give results.  Nothing further needed at this time.

## 2019-02-12 NOTE — Telephone Encounter (Signed)
Copied from CRM [id number]. Topic: General - Other >> Feb 12, 2019 11:56 AM Mikayla Sweeney wrote: Reason for CRM: Patient called to request that the nurse or doctor call her to explain the results of her MRI.  Patient looked at the results through My Chart but does not understand the terminology.  Please advise and call back at [phone]

## 2019-02-13 ENCOUNTER — Other Ambulatory Visit: Payer: Self-pay | Admitting: Family Medicine

## 2019-02-13 DIAGNOSIS — M4800 Spinal stenosis, site unspecified: Secondary | ICD-10-CM

## 2019-02-13 NOTE — Telephone Encounter (Signed)
After going over results with triage nurse, patient called back inquiring the "do's and don'ts" regarding results. Specifically regarding work/heavy lifting.

## 2019-02-13 NOTE — Telephone Encounter (Signed)
Pt called and was given doctors recommendations. Pt stated clear understanding.

## 2019-02-13 NOTE — Telephone Encounter (Signed)
Pt states she missed a call from the office. She would like to know if it was misty, please advise.

## 2019-02-13 NOTE — Telephone Encounter (Signed)
She can carry out her normal ADL's. I would avoid lifting greater than 25 pounds until she is seen by Neurosurgery

## 2019-02-13 NOTE — Telephone Encounter (Signed)
Nothing needed. 

## 2019-02-13 NOTE — Progress Notes (Signed)
Order placed for neurosurgery.  

## 2019-03-12 ENCOUNTER — Telehealth: Payer: Self-pay

## 2019-03-12 NOTE — Telephone Encounter (Signed)
Pt notified of below message and agreed to plan.  Will call back if needed.  Nothing further.

## 2019-03-12 NOTE — Telephone Encounter (Signed)
Warm compress and eye lid massage is the mainstay treatment. There are no OTC medications or ointments for Stye

## 2019-03-12 NOTE — Telephone Encounter (Signed)
Copied from CRM [id number]. Topic: General - Inquiry >> Mar 11, 2019  3:57 PM [name], NT wrote: Reason for CRM: Patient called [facility] and left voicemail. Patient stated she has had a stye on her eye for the last two days and is requesting an OTC medication or ointment to get rid of it. Patient states she has had warm compresses on her eye to help with soreness and it has not gone away.

## 2019-04-22 ENCOUNTER — Ambulatory Visit: Payer: Self-pay | Admitting: *Deleted

## 2019-04-22 NOTE — Telephone Encounter (Signed)
Spoke with [name], he reviewed symptoms and recommends pt go to [facility] for evaluation to determine possible need for cxr or ekg.   Spoke with pt and advised. She will go to [facility] this afternoon for evaluation.   [name] - FYI thanks!

## 2019-04-22 NOTE — Telephone Encounter (Signed)
Patient is calling with chest tightness that comes and goes for couple weeks now. Patient states the discomfort is mild. Patient reports she has gained weight but she states it is from eating- she states she does not have swelling in extremities and no puffiness in hands or body. Patient states she does not have pain or increase in discomfort with deep breath. Patient does report a dry cough at this time that is not bothersome or frequent. Call to office to discuss proper protocol for this patient- ED is disposition. They request note for review.  Reason for Disposition . [1] Chest pain lasts > 5 minutes AND [2] history of heart disease  (i.e., heart attack, bypass surgery, angina, angioplasty, CHF; not just a heart murmur) . Chest pain  Answer Assessment - Initial Assessment Questions 1. LOCATION: "Where does it hurt?"       Tightness in chest- left and middle 2. RADIATION: "Does the pain go anywhere else?" (e.g., into neck, jaw, arms, back)     no 3. ONSET: "When did the chest pain begin?" (Minutes, hours or days)      Couple weeks- comes and goes 4. PATTERN "Does the pain come and go, or has it been constant since it started?"  "Does it get worse with exertion?"      Comes and goes- feeling now 5. DURATION: "How long does it last" (e.g., seconds, minutes, hours)     Normally last- hours 6. SEVERITY: "How bad is the pain?"  (e.g., Scale 1-10; mild, moderate, or severe)    - MILD (1-3): doesn't interfere with normal activities     - MODERATE (4-7): interferes with normal activities or awakens from sleep    - SEVERE (8-10): excruciating pain, unable to do any normal activities       mild 7. CARDIAC RISK FACTORS: "Do you have any history of heart problems or risk factors for heart disease?" (e.g., prior heart attack, angina; high blood pressure, diabetes, being overweight, high cholesterol, smoking, or strong family history of heart disease)     CHF 8. PULMONARY RISK FACTORS: "Do you have any  history of lung disease?"  (e.g., blood clots in lung, asthma, emphysema, birth control pills)     no 9. CAUSE: "What do you think is causing the chest pain?"     Allergies- recently eyes water 10. OTHER SYMPTOMS: "Do you have any other symptoms?" (e.g., dizziness, nausea, vomiting, sweating, fever, difficulty breathing, cough)       Dry cough that comes and goes,nausea from time to time 11. PREGNANCY: "Is there any chance you are pregnant?" "When was your last menstrual period?"       n/a  Answer Assessment - Initial Assessment Questions 1. COVID-19 DIAGNOSIS: "Who made your Coronavirus (COVID-19) diagnosis?" "Was it confirmed by a positive lab test?" If not diagnosed by a HCP, ask "Are there lots of cases (community spread) where you live?" (See public health department website, if unsure)   * MAJOR community spread: high number of cases; numbers of cases are increasing; many people hospitalized.   * MINOR community spread: low number of cases; not increasing; few or no people hospitalized     minor 2. ONSET: "When did the COVID-19 symptoms start?"      Couple weeks 3. WORST SYMPTOM: "What is your worst symptom?" (e.g., cough, fever, shortness of breath, muscle aches)     Chest tightness 4. COUGH: "Do you have a cough?" If so, ask: "How bad is the cough?"  Dry cough- comes and goes 5. FEVER: "Do you have a fever?" If so, ask: "What is your temperature, how was it measured, and when did it start?"     no 6. RESPIRATORY STATUS: "Describe your breathing?" (e.g., shortness of breath, wheezing, unable to speak)      no 7. BETTER-SAME-WORSE: "Are you getting better, staying the same or getting worse compared to yesterday?"  If getting worse, ask, "In what way?"     same 8. HIGH RISK DISEASE: "Do you have any chronic medical problems?" (e.g., asthma, heart or lung disease, weak immune system, etc.)     CHF 9. PREGNANCY: "Is there any chance you are pregnant?" "When was your last menstrual  period?"     n/a 10. OTHER SYMPTOMS: "Do you have any other symptoms?"  (e.g., runny nose, headache, sore throat, loss of smell)       no  Protocols used: CHEST PAIN-A-AH, CORONAVIRUS (COVID-19) DIAGNOSED OR SUSPECTED-A-AH

## 2019-05-19 ENCOUNTER — Other Ambulatory Visit: Payer: Self-pay

## 2019-05-19 ENCOUNTER — Encounter: Payer: Self-pay | Admitting: Adult Health

## 2019-05-19 ENCOUNTER — Ambulatory Visit (INDEPENDENT_AMBULATORY_CARE_PROVIDER_SITE_OTHER): Payer: Medicare HMO | Admitting: Adult Health

## 2019-05-19 DIAGNOSIS — N3000 Acute cystitis without hematuria: Secondary | ICD-10-CM

## 2019-05-19 MED ORDER — CEPHALEXIN 500 MG PO CAPS: 500.0000 mg | ORAL_CAPSULE | Freq: Three times a day (TID) | ORAL | 0 refills | Status: AC

## 2019-05-19 NOTE — Progress Notes (Signed)
Virtual Visit via Video Note  I connected with Mikayla Sweeney on 05/19/19 at  4:00 PM EDT by a video enabled telemedicine application and verified that I am speaking with the correct person using two identifiers.  Location patient: home Location provider:work or home office Persons participating in the virtual visit: patient, provider  I discussed the limitations of evaluation and management by telemedicine and the availability of in person appointments. The patient expressed understanding and agreed to proceed.   HPI: 58 year old female who is being evaluated today for an acute issue.  She reports that her symptoms started approximately 2 days ago.  Her symptoms include frequent urination, feeling as though she cannot empty her bladder completely, low back pain, strong odor of urine, and lower abdominal pain.  She also reports subjective fevers.  Denies dysuria or hematuria.   ROS: See pertinent positives and negatives per HPI.  Past Medical History:  Diagnosis Date  . Anxiety   . Asthma   . B12 deficiency   . Bipolar 1 disorder ([place])   . CHF (congestive heart failure) ([place])   . Epilepsy ([place])   . Hypertension   . Sarcoidosis     Past Surgical History:  Procedure Laterality Date  . TONSILLECTOMY    . TUBAL LIGATION      Family History  Problem Relation Age of Onset  . Pancreatic cancer Mother   . Hypertension Mother   . Diabetes Mother   . Heart disease Father   . Gout Father   . Cirrhosis Father   . Heart disease Sister        CHF     Current Outpatient Medications:  .  albuterol (PROVENTIL HFA;VENTOLIN HFA) 108 (90 Base) MCG/ACT inhaler, Inhale 1-2 puffs into the lungs every 6 (six) hours as needed. For wheeze or shortness of breath, Disp: 1 Inhaler, Rfl: 0 .  amLODipine (NORVASC) 10 MG tablet, Take 1 tablet (10 mg total) by mouth daily., Disp: 90 tablet, Rfl: 3 .  carvedilol (COREG) 25 MG tablet, Take 1 tablet (25 mg total) by mouth 2 (two) times daily with a meal.,  Disp: 180 tablet, Rfl: 0 .  cephALEXin (KEFLEX) 500 MG capsule, Take 1 capsule (500 mg total) by mouth 3 (three) times daily for 7 days., Disp: 21 capsule, Rfl: 0 .  cyclobenzaprine (FLEXERIL) 10 MG tablet, Take 1 tablet (10 mg total) by mouth at bedtime., Disp: 5 tablet, Rfl: 0 .  fluticasone (FLONASE) 50 MCG/ACT nasal spray, Place 2 sprays into both nostrils daily., Disp: 1 g, Rfl: 0 .  furosemide (LASIX) 20 MG tablet, Take 1 tablet (20 mg total) by mouth daily., Disp: 90 tablet, Rfl: 1 .  hydrochlorothiazide (HYDRODIURIL) 25 MG tablet, Take 1 tablet (25 mg total) by mouth daily., Disp: 90 tablet, Rfl: 0 .  ibuprofen (ADVIL,MOTRIN) 600 MG tablet, Take 1 tablet (600 mg total) by mouth every 8 (eight) hours as needed., Disp: 30 tablet, Rfl: 0 .  ipratropium (ATROVENT) 0.06 % nasal spray, Place 2 sprays into both nostrils 4 (four) times daily., Disp: 15 mL, Rfl: 0 .  losartan (COZAAR) 100 MG tablet, Take 1 tablet (100 mg total) by mouth daily., Disp: 90 tablet, Rfl: 3 .  naproxen (NAPROSYN) 500 MG tablet, TAKE 1 TABLET BY MOUTH THREE TIMES A DAY WITH MEALS, Disp: 60 tablet, Rfl: 0 .  omeprazole (PRILOSEC) 20 MG capsule, Take 1 capsule (20 mg total) by mouth daily., Disp: 90 capsule, Rfl: 3 .  ondansetron (ZOFRAN ODT) 4 MG  disintegrating tablet, Take 1 tablet (4 mg total) by mouth every 8 (eight) hours as needed for nausea or vomiting. (Patient not taking: Reported on 12/24/2018), Disp: 10 tablet, Rfl: 0 .  predniSONE (DELTASONE) 20 MG tablet, TAKE 1 TABLET BY MOUTH ONCE DAILY, Disp: 7 tablet, Rfl: 0  EXAM:  VITALS per patient if applicable:  GENERAL: alert, oriented, appears well and in no acute distress  HEENT: atraumatic, conjunttiva clear, no obvious abnormalities on inspection of external nose and ears  NECK: normal movements of the head and neck  LUNGS: on inspection no signs of respiratory distress, breathing rate appears normal, no obvious gross SOB, gasping or wheezing  CV: no obvious  cyanosis  MS: moves all visible extremities without noticeable abnormality  PSYCH/NEURO: pleasant and cooperative, no obvious depression or anxiety, speech and thought processing grossly intact  ASSESSMENT AND PLAN:  Discussed the following assessment and plan:  -We will treat as acute cystitis.  Previous culture in December 2018 showed that she was resistant to multiple Oral medications but is susceptible to Keflex Acute cystitis without hematuria - Plan: cephALEXin (KEFLEX) 500 MG capsule -Advised to force fluids.  Follow-up if no improvement in the next 2 to 3 days    I discussed the assessment and treatment plan with the patient. The patient was provided an opportunity to ask questions and all were answered. The patient agreed with the plan and demonstrated an understanding of the instructions.   The patient was advised to call back or seek an in-person evaluation if the symptoms worsen or if the condition fails to improve as anticipated.   Shirline Freesory Lashaun Poch, NP

## 2019-05-27 ENCOUNTER — Ambulatory Visit: Payer: Medicare HMO | Admitting: Adult Health

## 2019-05-27 ENCOUNTER — Telehealth: Payer: Self-pay | Admitting: *Deleted

## 2019-05-27 NOTE — Telephone Encounter (Signed)
Copied from [place] [id number]. Topic: Quick Communication - Appointment Cancellation >> May 27, 2019  1:35 PM Selinda Flavin B, [place] wrote: Patient called to cancel appointment scheduled for 05/27/2019 at 2:30pm. States that she is feeling better. Patient has NOT rescheduled their appointment. Attempted to reach office, no answer.   Route to department's PEC pool.  Appointment canceled

## 2019-05-27 NOTE — Telephone Encounter (Signed)
Noted  

## 2019-06-15 ENCOUNTER — Other Ambulatory Visit: Payer: Self-pay | Admitting: Adult Health

## 2019-06-15 DIAGNOSIS — I10 Essential (primary) hypertension: Secondary | ICD-10-CM

## 2019-06-17 NOTE — Telephone Encounter (Signed)
Sent to the pharmacy by e-scribe. 

## 2019-08-20 ENCOUNTER — Other Ambulatory Visit: Payer: Self-pay

## 2019-08-20 ENCOUNTER — Ambulatory Visit: Payer: Medicare HMO | Admitting: Adult Health

## 2019-08-20 ENCOUNTER — Telehealth (INDEPENDENT_AMBULATORY_CARE_PROVIDER_SITE_OTHER): Payer: Medicare HMO | Admitting: Adult Health

## 2019-08-20 DIAGNOSIS — N3000 Acute cystitis without hematuria: Secondary | ICD-10-CM

## 2019-08-20 MED ORDER — CEPHALEXIN 500 MG PO CAPS: 500.0000 mg | ORAL_CAPSULE | Freq: Three times a day (TID) | ORAL | 0 refills | Status: AC

## 2019-08-20 NOTE — Progress Notes (Signed)
Virtual Visit via Video Note  I connected with Mikayla Sweeney on 08/20/19 at  2:00 PM EDT by a video enabled telemedicine application and verified that I am speaking with the correct person using two identifiers.  Location patient: home Location provider:work or home office Persons participating in the virtual visit: patient, provider  I discussed the limitations of evaluation and management by telemedicine and the availability of in person appointments. The patient expressed understanding and agreed to proceed.   HPI:  58 year old female who is being evaluated today for an acute issue of concern of urinary tract infection.  Her symptoms started 2 to 3 days ago.  Her symptoms include low back pain, lower pelvic pressure, urinary frequency and urgency.  She has had a subjective fever but no chills.  She denies dysuria or hematuria.  ROS: See pertinent positives and negatives per HPI.  Past Medical History:  Diagnosis Date  . Anxiety   . Asthma   . B12 deficiency   . Bipolar 1 disorder ([facility])   . CHF (congestive heart failure) ([facility])   . Epilepsy ([facility])   . Hypertension   . Sarcoidosis     Past Surgical History:  Procedure Laterality Date  . TONSILLECTOMY    . TUBAL LIGATION      Family History  Problem Relation Age of Onset  . Pancreatic cancer Mother   . Hypertension Mother   . Diabetes Mother   . Heart disease Father   . Gout Father   . Cirrhosis Father   . Heart disease Sister        CHF     Current Outpatient Medications:  .  albuterol (PROVENTIL HFA;VENTOLIN HFA) 108 (90 Base) MCG/ACT inhaler, Inhale 1-2 puffs into the lungs every 6 (six) hours as needed. For wheeze or shortness of breath, Disp: 1 Inhaler, Rfl: 0 .  amLODipine (NORVASC) 10 MG tablet, Take 1 tablet (10 mg total) by mouth daily., Disp: 90 tablet, Rfl: 3 .  carvedilol (COREG) 25 MG tablet, TAKE 1 TABLET (25 MG TOTAL) BY MOUTH 2 (TWO) TIMES DAILY WITH A MEAL., Disp: 180 tablet, Rfl: 1 .  cyclobenzaprine  (FLEXERIL) 10 MG tablet, Take 1 tablet (10 mg total) by mouth at bedtime., Disp: 5 tablet, Rfl: 0 .  fluticasone (FLONASE) 50 MCG/ACT nasal spray, Place 2 sprays into both nostrils daily., Disp: 1 g, Rfl: 0 .  furosemide (LASIX) 20 MG tablet, Take 1 tablet (20 mg total) by mouth daily., Disp: 90 tablet, Rfl: 1 .  hydrochlorothiazide (HYDRODIURIL) 25 MG tablet, Take 1 tablet (25 mg total) by mouth daily., Disp: 90 tablet, Rfl: 0 .  ibuprofen (ADVIL,MOTRIN) 600 MG tablet, Take 1 tablet (600 mg total) by mouth every 8 (eight) hours as needed., Disp: 30 tablet, Rfl: 0 .  ipratropium (ATROVENT) 0.06 % nasal spray, Place 2 sprays into both nostrils 4 (four) times daily., Disp: 15 mL, Rfl: 0 .  losartan (COZAAR) 100 MG tablet, Take 1 tablet (100 mg total) by mouth daily., Disp: 90 tablet, Rfl: 3 .  naproxen (NAPROSYN) 500 MG tablet, TAKE 1 TABLET BY MOUTH THREE TIMES A DAY WITH MEALS, Disp: 60 tablet, Rfl: 0 .  omeprazole (PRILOSEC) 20 MG capsule, Take 1 capsule (20 mg total) by mouth daily., Disp: 90 capsule, Rfl: 3 .  ondansetron (ZOFRAN ODT) 4 MG disintegrating tablet, Take 1 tablet (4 mg total) by mouth every 8 (eight) hours as needed for nausea or vomiting. (Patient not taking: Reported on 12/24/2018), Disp: 10 tablet, Rfl:  0 .  predniSONE (DELTASONE) 20 MG tablet, TAKE 1 TABLET BY MOUTH ONCE DAILY, Disp: 7 tablet, Rfl: 0  EXAM:  VITALS per patient if applicable:  GENERAL: alert, oriented, appears well and in no acute distress  HEENT: atraumatic, conjunttiva clear, no obvious abnormalities on inspection of external nose and ears  NECK: normal movements of the head and neck  LUNGS: on inspection no signs of respiratory distress, breathing rate appears normal, no obvious gross SOB, gasping or wheezing  CV: no obvious cyanosis  MS: moves all visible extremities without noticeable abnormality  PSYCH/NEURO: pleasant and cooperative, no obvious depression or anxiety, speech and thought processing  grossly intact  ASSESSMENT AND PLAN:  Discussed the following assessment and plan:  1. Acute cystitis without hematuria -Drink plenty of water.  Follow-up if symptoms have not resolved in 3 to 5 days.  Follow-up sooner if symptoms worsen - cephALEXin (KEFLEX) 500 MG capsule; Take 1 capsule (500 mg total) by mouth 3 (three) times daily for 5 days.  Dispense: 15 capsule; Refill: 0     I discussed the assessment and treatment plan with the patient. The patient was provided an opportunity to ask questions and all were answered. The patient agreed with the plan and demonstrated an understanding of the instructions.   The patient was advised to call back or seek an in-person evaluation if the symptoms worsen or if the condition fails to improve as anticipated.   Dorothyann Peng, NP

## 2019-09-03 ENCOUNTER — Telehealth: Payer: Self-pay

## 2019-09-03 NOTE — Telephone Encounter (Signed)
Copied from [place] [id number]. Topic: Quick Communication - Rx Refill/Question >> Sep 03, 2019  1:03 PM Erick Blinks wrote: Pt is experiencing UTI symptoms and is requesting more medication. She was just seen recently for the same problem Best contact: [phone] [id number] [place], [place] - [address] [place] [address] Phone: [phone] Fax: [phone] >> Sep 03, 2019  3:40 PM Ivar Drape wrote: Patient is checking on the status of her previous request.

## 2019-09-04 ENCOUNTER — Other Ambulatory Visit: Payer: Self-pay

## 2019-09-04 ENCOUNTER — Telehealth (INDEPENDENT_AMBULATORY_CARE_PROVIDER_SITE_OTHER): Payer: Medicare HMO | Admitting: Adult Health

## 2019-09-04 ENCOUNTER — Ambulatory Visit: Payer: Self-pay

## 2019-09-04 DIAGNOSIS — N3000 Acute cystitis without hematuria: Secondary | ICD-10-CM | POA: Diagnosis not present

## 2019-09-04 MED ORDER — NITROFURANTOIN MONOHYD MACRO 100 MG PO CAPS: 100.0000 mg | ORAL_CAPSULE | Freq: Two times a day (BID) | ORAL | 0 refills | Status: DC

## 2019-09-04 NOTE — Telephone Encounter (Signed)
Since it did not clear up with the current antibiotic. I will need to get a urine sample from her, I can put the order in and she can drop it off if she would like

## 2019-09-04 NOTE — Telephone Encounter (Signed)
Pt. Reports yesterday she started having back and abdominal pain, body aches, nausea, urinary frequency. States "I feel like now I have a kidney infection." Requests "more medication be sent to my pharmacy." Please advise pt.  Answer Assessment - Initial Assessment Questions 1. SYMPTOM: "What's the main symptom you're concerned about?" (e.g., frequency, incontinence)     Nausea, aches, frequency 2. ONSET: "When did the  symptoms  start?"     Yesterday 3. PAIN: "Is there any pain?" If so, ask: "How bad is it?" (Scale: 1-10; mild, moderate, severe)     Pain in back and low abd. 4. CAUSE: "What do you think is causing the symptoms?"     Infection 5. OTHER SYMPTOMS: "Do you have any other symptoms?" (e.g., fever, flank pain, blood in urine, pain with urination)     Back pain, body aches 6. PREGNANCY: "Is there any chance you are pregnant?" "When was your last menstrual period?"     no  Protocols used: URINARY [facility]

## 2019-09-04 NOTE — Telephone Encounter (Signed)
Pt triaged by [facility] nurses. See encounter from 09/04/2019.

## 2019-09-04 NOTE — Progress Notes (Signed)
Virtual Visit via Video Note  I connected with Vladimir Crofts on 09/04/19 at 11:00 AM EDT by a video enabled telemedicine application and verified that I am speaking with the correct person using two identifiers.  Location patient: home Location provider:work or home office Persons participating in the virtual visit: patient, provider  I discussed the limitations of evaluation and management by telemedicine and the availability of in person appointments. The patient expressed understanding and agreed to proceed.   HPI: 58 year old female who is being evaluated today for continued urinary tract infection.  Was last evaluated on 08/20/2019 for acute cystitis and prescribed Keflex.  Her last urine culture was in 2018 and she was resistant to multiple oral antibiotics.  She reports that she felt better for about a day after completing Keflex but then her symptoms returned.  Symptoms currently are low-grade fever to 99.4, dysuria, urinary urgency, frequency, feeling as though she cannot empty her bladder, lower abdominal pain, nausea, and low back pain.  Is unable to come in today to give a urine sample.  She had sensitivity to Macrobid with her last urine culture.  Send that in, but she was advised if her symptoms get worse then she needs to go the emergency room over the weekend.  She denies hematuria, vomiting, or diarrhea   ROS: See pertinent positives and negatives per HPI.  Past Medical History:  Diagnosis Date  . Anxiety   . Asthma   . B12 deficiency   . Bipolar 1 disorder ([place])   . CHF (congestive heart failure) ([place])   . Epilepsy ([place])   . Hypertension   . Sarcoidosis     Past Surgical History:  Procedure Laterality Date  . TONSILLECTOMY    . TUBAL LIGATION      Family History  Problem Relation Age of Onset  . Pancreatic cancer Mother   . Hypertension Mother   . Diabetes Mother   . Heart disease Father   . Gout Father   . Cirrhosis Father   . Heart disease Sister    CHF     Current Outpatient Medications:  .  albuterol (PROVENTIL HFA;VENTOLIN HFA) 108 (90 Base) MCG/ACT inhaler, Inhale 1-2 puffs into the lungs every 6 (six) hours as needed. For wheeze or shortness of breath, Disp: 1 Inhaler, Rfl: 0 .  amLODipine (NORVASC) 10 MG tablet, Take 1 tablet (10 mg total) by mouth daily., Disp: 90 tablet, Rfl: 3 .  carvedilol (COREG) 25 MG tablet, TAKE 1 TABLET (25 MG TOTAL) BY MOUTH 2 (TWO) TIMES DAILY WITH A MEAL., Disp: 180 tablet, Rfl: 1 .  cyclobenzaprine (FLEXERIL) 10 MG tablet, Take 1 tablet (10 mg total) by mouth at bedtime., Disp: 5 tablet, Rfl: 0 .  fluticasone (FLONASE) 50 MCG/ACT nasal spray, Place 2 sprays into both nostrils daily., Disp: 1 g, Rfl: 0 .  furosemide (LASIX) 20 MG tablet, Take 1 tablet (20 mg total) by mouth daily., Disp: 90 tablet, Rfl: 1 .  hydrochlorothiazide (HYDRODIURIL) 25 MG tablet, Take 1 tablet (25 mg total) by mouth daily., Disp: 90 tablet, Rfl: 0 .  ibuprofen (ADVIL,MOTRIN) 600 MG tablet, Take 1 tablet (600 mg total) by mouth every 8 (eight) hours as needed., Disp: 30 tablet, Rfl: 0 .  ipratropium (ATROVENT) 0.06 % nasal spray, Place 2 sprays into both nostrils 4 (four) times daily., Disp: 15 mL, Rfl: 0 .  losartan (COZAAR) 100 MG tablet, Take 1 tablet (100 mg total) by mouth daily., Disp: 90 tablet, Rfl: 3 .  naproxen (NAPROSYN) 500 MG tablet, TAKE 1 TABLET BY MOUTH THREE TIMES A DAY WITH MEALS, Disp: 60 tablet, Rfl: 0 .  omeprazole (PRILOSEC) 20 MG capsule, Take 1 capsule (20 mg total) by mouth daily., Disp: 90 capsule, Rfl: 3  EXAM:  VITALS per patient if applicable:  GENERAL: alert, oriented, appears well and in no acute distress  HEENT: atraumatic, conjunttiva clear, no obvious abnormalities on inspection of external nose and ears  NECK: normal movements of the head and neck  LUNGS: on inspection no signs of respiratory distress, breathing rate appears normal, no obvious gross SOB, gasping or wheezing  CV: no  obvious cyanosis  MS: moves all visible extremities without noticeable abnormality  PSYCH/NEURO: pleasant and cooperative, no obvious depression or anxiety, speech and thought processing grossly intact  ASSESSMENT AND PLAN:  Discussed the following assessment and plan:  1. Acute cystitis without hematuria - Stay hydrated. Can take Tylenol/Motrin for symptom relief - nitrofurantoin, macrocrystal-monohydrate, (MACROBID) 100 MG capsule; Take 1 capsule (100 mg total) by mouth 2 (two) times daily.  Dispense: 10 capsule; Refill: 0 - Follow up if no improvement in the next 2-3 days       I discussed the assessment and treatment plan with the patient. The patient was provided an opportunity to ask questions and all were answered. The patient agreed with the plan and demonstrated an understanding of the instructions.   The patient was advised to call back or seek an in-person evaluation if the symptoms worsen or if the condition fails to improve as anticipated.   Shirline Frees, NP

## 2019-09-04 NOTE — Telephone Encounter (Signed)
Left a message for a return call.  Needs to be triaged and sent to [facility].

## 2019-09-04 NOTE — Telephone Encounter (Signed)
Pt scheduled for 11 AM virtual appt with [facility].  Pt states she is too sick to drive and she has her grandchildren so she cannot come to the office.  [place] notified.  Nothing further needed.

## 2019-09-07 ENCOUNTER — Telehealth: Payer: Self-pay | Admitting: Adult Health

## 2019-09-07 NOTE — Telephone Encounter (Signed)
Pt called wanted to know if she needs to come in to the office to give a urine sample.  She state that she is not 100% but she is almost finished with the medication.  Pt would like to have a call to let her know what she needs to do.

## 2019-09-08 NOTE — Telephone Encounter (Signed)
Left a message for a return call.

## 2019-09-08 NOTE — Telephone Encounter (Signed)
Pt called back to advise she is feeling better today, and does not to come in to give UA. However, I did read the message [facility] relayed, read word for word, and pt verbalized understanding.  Will call back if need.

## 2019-09-08 NOTE — Telephone Encounter (Signed)
She does not need to give a urine sample while taking abx. If her symptoms do not resolve or come back then we will wait about 4 days after finishing abx and have her follow up to give a urine sample

## 2019-09-16 ENCOUNTER — Telehealth: Payer: Self-pay

## 2019-09-16 NOTE — Telephone Encounter (Signed)
Copied from [place] [id number]. Topic: General - Other >> Sep 16, 2019  9:54 AM Rainey Pines A wrote: Patient would like a callback from nurse in regards to UTI medication not working for patient.

## 2019-09-16 NOTE — Telephone Encounter (Signed)
Spoke to the pt.  She is on the lab schedule for urine in the morning and will have doxy visit tomorrow afternoon.  Pt notified that the urine must be dropped off in order to proceed with virtual visit.  Pt agreed.

## 2019-09-16 NOTE — Telephone Encounter (Signed)
Will need to come in and be evaluated

## 2019-09-16 NOTE — Telephone Encounter (Signed)
Spoke to the pt.  She finished antibiotics last week.  Pt stated she started feeling bad again yesterday.  Sx include back pain, pelvic pain, urinary frequency and nausea.  Denied fever.  Will forward to [facility] for instruction.

## 2019-09-17 ENCOUNTER — Other Ambulatory Visit: Payer: Self-pay | Admitting: Adult Health

## 2019-09-17 ENCOUNTER — Other Ambulatory Visit: Payer: Medicare HMO

## 2019-09-17 ENCOUNTER — Telehealth (INDEPENDENT_AMBULATORY_CARE_PROVIDER_SITE_OTHER): Payer: Medicare HMO | Admitting: Adult Health

## 2019-09-17 ENCOUNTER — Other Ambulatory Visit (INDEPENDENT_AMBULATORY_CARE_PROVIDER_SITE_OTHER): Payer: Medicare HMO

## 2019-09-17 ENCOUNTER — Other Ambulatory Visit: Payer: Self-pay

## 2019-09-17 DIAGNOSIS — R300 Dysuria: Secondary | ICD-10-CM

## 2019-09-17 DIAGNOSIS — N3000 Acute cystitis without hematuria: Secondary | ICD-10-CM | POA: Diagnosis not present

## 2019-09-17 LAB — POC URINALSYSI DIPSTICK (AUTOMATED)
Glucose, UA: NEGATIVE
Leukocytes, UA: NEGATIVE
Protein, UA: NEGATIVE
Spec Grav, UA: 1.025 (ref 1.010–1.025)
Urobilinogen, UA: 0.2 E.U./dL
pH, UA: 6 (ref 5.0–8.0)

## 2019-09-17 NOTE — Progress Notes (Signed)
Virtual Visit via Video Note  I connected with Mikayla Sweeney on 09/17/19 at  3:30 PM EDT by a video enabled telemedicine application and verified that I am speaking with the correct person using two identifiers.  Location patient: home Location provider:work or home office Persons participating in the virtual visit: patient, provider  I discussed the limitations of evaluation and management by telemedicine and the availability of in person appointments. The patient expressed understanding and agreed to proceed.   HPI: Follow-up from appointment on 09/04/2019 for continued symptoms of a UTI.  She was originally evaluated on 08/20/2019 for acute cystitis and prescribed Keflex.  She reported that she felt better for about a day after completing Keflex but then her symptoms returned.  She was then reevaluated on 925 with symptoms of a low-grade fever, dysuria, urinary urgency, frequency, decreased bladder emptying and lower abdominal pain with nausea.  She was unable to come in to give a urine sample.  He was then placed on Macrobid and advised to follow-up.  She completed Macrobid therapy on 9/30.  Today she reports that she felt better "for a minute" while taking Macrobid but her symptoms soon came back.  She reports some days she feels better than others.  Symptoms include low back pain, lower abdominal pain, nausea, urinary frequency, urgency, and incomplete bladder emptying.  Since her last urine culture was in 2018 and she was resistant to multiple oral antibiotics she was brought in to give a urine sample today.  This urine sample came back negative for UTI.  Culture was sent out and we will be awaiting the results of this.   ROS: See pertinent positives and negatives per HPI.  Past Medical History:  Diagnosis Date  . Anxiety   . Asthma   . B12 deficiency   . Bipolar 1 disorder (HCC)   . CHF (congestive heart failure) (HCC)   . Epilepsy (HCC)   . Hypertension   . Sarcoidosis     Past  Surgical History:  Procedure Laterality Date  . TONSILLECTOMY    . TUBAL LIGATION      Family History  Problem Relation Age of Onset  . Pancreatic cancer Mother   . Hypertension Mother   . Diabetes Mother   . Heart disease Father   . Gout Father   . Cirrhosis Father   . Heart disease Sister        CHF     Current Outpatient Medications:  .  albuterol (PROVENTIL HFA;VENTOLIN HFA) 108 (90 Base) MCG/ACT inhaler, Inhale 1-2 puffs into the lungs every 6 (six) hours as needed. For wheeze or shortness of breath, Disp: 1 Inhaler, Rfl: 0 .  amLODipine (NORVASC) 10 MG tablet, Take 1 tablet (10 mg total) by mouth daily., Disp: 90 tablet, Rfl: 3 .  carvedilol (COREG) 25 MG tablet, TAKE 1 TABLET (25 MG TOTAL) BY MOUTH 2 (TWO) TIMES DAILY WITH A MEAL., Disp: 180 tablet, Rfl: 1 .  cyclobenzaprine (FLEXERIL) 10 MG tablet, Take 1 tablet (10 mg total) by mouth at bedtime., Disp: 5 tablet, Rfl: 0 .  fluticasone (FLONASE) 50 MCG/ACT nasal spray, Place 2 sprays into both nostrils daily., Disp: 1 g, Rfl: 0 .  furosemide (LASIX) 20 MG tablet, Take 1 tablet (20 mg total) by mouth daily., Disp: 90 tablet, Rfl: 1 .  hydrochlorothiazide (HYDRODIURIL) 25 MG tablet, Take 1 tablet (25 mg total) by mouth daily., Disp: 90 tablet, Rfl: 0 .  ibuprofen (ADVIL,MOTRIN) 600 MG tablet, Take 1 tablet (600  mg total) by mouth every 8 (eight) hours as needed., Disp: 30 tablet, Rfl: 0 .  ipratropium (ATROVENT) 0.06 % nasal spray, Place 2 sprays into both nostrils 4 (four) times daily., Disp: 15 mL, Rfl: 0 .  losartan (COZAAR) 100 MG tablet, Take 1 tablet (100 mg total) by mouth daily., Disp: 90 tablet, Rfl: 3 .  naproxen (NAPROSYN) 500 MG tablet, TAKE 1 TABLET BY MOUTH THREE TIMES A DAY WITH MEALS, Disp: 60 tablet, Rfl: 0 .  nitrofurantoin, macrocrystal-monohydrate, (MACROBID) 100 MG capsule, Take 1 capsule (100 mg total) by mouth 2 (two) times daily., Disp: 10 capsule, Rfl: 0 .  omeprazole (PRILOSEC) 20 MG capsule, Take 1  capsule (20 mg total) by mouth daily., Disp: 90 capsule, Rfl: 3  EXAM:  VITALS per patient if applicable:  GENERAL: alert, oriented, appears well and in no acute distress  HEENT: atraumatic, conjunttiva clear, no obvious abnormalities on inspection of external nose and ears  NECK: normal movements of the head and neck  LUNGS: on inspection no signs of respiratory distress, breathing rate appears normal, no obvious gross SOB, gasping or wheezing  CV: no obvious cyanosis  MS: moves all visible extremities without noticeable abnormality  PSYCH/NEURO: pleasant and cooperative, no obvious depression or anxiety, speech and thought processing grossly intact  ASSESSMENT AND PLAN:  Discussed the following assessment and plan:  1. Acute cystitis without hematuria -She did not appear acutely ill on video today.  Due to continued symptoms urine culture was sent out we will await the results of this.  She was advised to try using Prilosec over the weekend to see if symptoms resolve and this is possibly related to GERD.  We will follow-up with her early next week once urine culture has resulted.  Need to consider referral to gastroenterology or urology or CT scan of abdomen if no improvement     I discussed the assessment and treatment plan with the patient. The patient was provided an opportunity to ask questions and all were answered. The patient agreed with the plan and demonstrated an understanding of the instructions.   The patient was advised to call back or seek an in-person evaluation if the symptoms worsen or if the condition fails to improve as anticipated.   Dorothyann Peng, NP

## 2019-09-18 LAB — URINE CULTURE
MICRO NUMBER:: 969378
SPECIMEN QUALITY:: ADEQUATE

## 2019-11-19 ENCOUNTER — Other Ambulatory Visit: Payer: Self-pay

## 2019-11-19 ENCOUNTER — Other Ambulatory Visit: Payer: Medicare HMO

## 2019-11-19 ENCOUNTER — Telehealth (INDEPENDENT_AMBULATORY_CARE_PROVIDER_SITE_OTHER): Payer: Medicare HMO | Admitting: Adult Health

## 2019-11-19 DIAGNOSIS — R350 Frequency of micturition: Secondary | ICD-10-CM

## 2019-11-19 LAB — POC URINALSYSI DIPSTICK (AUTOMATED)
Bilirubin, UA: NEGATIVE
Blood, UA: NEGATIVE
Glucose, UA: NEGATIVE
Ketones, UA: NEGATIVE
Leukocytes, UA: NEGATIVE
Nitrite, UA: NEGATIVE
Protein, UA: NEGATIVE
Spec Grav, UA: 1.02 (ref 1.010–1.025)
Urobilinogen, UA: 0.2 E.U./dL
pH, UA: 6 (ref 5.0–8.0)

## 2019-11-19 MED ORDER — OXYBUTYNIN CHLORIDE ER 5 MG PO TB24: 5.0000 mg | ORAL_TABLET | Freq: Every day | ORAL | 0 refills | Status: DC

## 2019-11-19 NOTE — Progress Notes (Signed)
Virtual Visit via Video Note  I connected with Mikayla Sweeney on 11/19/19 at  2:00 PM EST by a video enabled telemedicine application and verified that I am speaking with the correct person using two identifiers.  Location patient: home Location provider:work or home office Persons participating in the virtual visit: patient, provider  I discussed the limitations of evaluation and management by telemedicine and the availability of in person appointments. The patient expressed understanding and agreed to proceed.   HPI:  58 year old female who is being evaluated today for UTI-like symptoms.  Reports that her symptoms started 4 days ago.  Her symptoms include frequent urination, back pain, headaches, body aches, and intermittent nausea.  She has also experienced bladder spasms, these are typical signs for her when she has a UTI.  She denies fevers, chills, dysuria or hematuria.  Most recently she was evaluated on 09/17/2019 with the same type of symptoms and her urinalysis and urine culture were clean.  She does drink caffeine " more than I should"   ROS: See pertinent positives and negatives per HPI.  Past Medical History:  Diagnosis Date  . Anxiety   . Asthma   . B12 deficiency   . Bipolar 1 disorder ([place])   . CHF (congestive heart failure) ([place])   . Epilepsy ([place])   . Hypertension   . Sarcoidosis     Past Surgical History:  Procedure Laterality Date  . TONSILLECTOMY    . TUBAL LIGATION      Family History  Problem Relation Age of Onset  . Pancreatic cancer Mother   . Hypertension Mother   . Diabetes Mother   . Heart disease Father   . Gout Father   . Cirrhosis Father   . Heart disease Sister        CHF     Current Outpatient Medications:  .  albuterol (PROVENTIL HFA;VENTOLIN HFA) 108 (90 Base) MCG/ACT inhaler, Inhale 1-2 puffs into the lungs every 6 (six) hours as needed. For wheeze or shortness of breath, Disp: 1 Inhaler, Rfl: 0 .  amLODipine (NORVASC) 10 MG  tablet, Take 1 tablet (10 mg total) by mouth daily., Disp: 90 tablet, Rfl: 3 .  carvedilol (COREG) 25 MG tablet, TAKE 1 TABLET (25 MG TOTAL) BY MOUTH 2 (TWO) TIMES DAILY WITH A MEAL., Disp: 180 tablet, Rfl: 1 .  cyclobenzaprine (FLEXERIL) 10 MG tablet, Take 1 tablet (10 mg total) by mouth at bedtime., Disp: 5 tablet, Rfl: 0 .  fluticasone (FLONASE) 50 MCG/ACT nasal spray, Place 2 sprays into both nostrils daily., Disp: 1 g, Rfl: 0 .  furosemide (LASIX) 20 MG tablet, Take 1 tablet (20 mg total) by mouth daily., Disp: 90 tablet, Rfl: 1 .  hydrochlorothiazide (HYDRODIURIL) 25 MG tablet, Take 1 tablet (25 mg total) by mouth daily., Disp: 90 tablet, Rfl: 0 .  ibuprofen (ADVIL,MOTRIN) 600 MG tablet, Take 1 tablet (600 mg total) by mouth every 8 (eight) hours as needed., Disp: 30 tablet, Rfl: 0 .  ipratropium (ATROVENT) 0.06 % nasal spray, Place 2 sprays into both nostrils 4 (four) times daily., Disp: 15 mL, Rfl: 0 .  losartan (COZAAR) 100 MG tablet, Take 1 tablet (100 mg total) by mouth daily., Disp: 90 tablet, Rfl: 3 .  naproxen (NAPROSYN) 500 MG tablet, TAKE 1 TABLET BY MOUTH THREE TIMES A DAY WITH MEALS, Disp: 60 tablet, Rfl: 0 .  omeprazole (PRILOSEC) 20 MG capsule, Take 1 capsule (20 mg total) by mouth daily., Disp: 90 capsule, Rfl: 3  EXAM:  VITALS per patient if applicable:  GENERAL: alert, oriented, appears well and in no acute distress  HEENT: atraumatic, conjunttiva clear, no obvious abnormalities on inspection of external nose and ears  NECK: normal movements of the head and neck  LUNGS: on inspection no signs of respiratory distress, breathing rate appears normal, no obvious gross SOB, gasping or wheezing  CV: no obvious cyanosis  MS: moves all visible extremities without noticeable abnormality  PSYCH/NEURO: pleasant and cooperative, no obvious depression or anxiety, speech and thought processing grossly intact  ASSESSMENT AND PLAN:  Discussed the following assessment and  plan:  1. Frequent urination -Urinalysis was negative.  Due to his symptoms will send urine culture.  At this point in time I am thinking her symptoms are more likely due to overactive bladder.  She was advised to cut back on caffeine and will start on Ditropan extended release.  We will follow-up with her once labs are back - POCT Urinalysis Dipstick (Automated) - oxybutynin (DITROPAN XL) 5 MG 24 hr tablet; Take 1 tablet (5 mg total) by mouth at bedtime.  Dispense: 30 tablet; Refill: 0 - Culture, Urine     I discussed the assessment and treatment plan with the patient. The patient was provided an opportunity to ask questions and all were answered. The patient agreed with the plan and demonstrated an understanding of the instructions.   The patient was advised to call back or seek an in-person evaluation if the symptoms worsen or if the condition fails to improve as anticipated.   Shirline Frees, NP

## 2019-11-20 ENCOUNTER — Telehealth: Payer: Self-pay | Admitting: *Deleted

## 2019-11-20 NOTE — Telephone Encounter (Signed)
Spoke to the pt and advised of the below message.  She will visit UC if sx become worse.  Nothing further needed.

## 2019-11-20 NOTE — Telephone Encounter (Signed)
I cannot do anything until the urine culture is back.

## 2019-11-20 NOTE — Telephone Encounter (Signed)
Copied from [place] [id number]. Topic: General - Call Back - No Documentation >> Nov 20, 2019 12:42 PM Erick Blinks wrote: Reason for CRM: Pt is requesting a call back from a nurse, please advise. Pt is still not feeling well even though urine specimen came back negative. Best contact: [phone]

## 2019-11-21 LAB — URINE CULTURE
MICRO NUMBER:: 1185085
SPECIMEN QUALITY:: ADEQUATE

## 2019-11-23 ENCOUNTER — Telehealth: Payer: Self-pay

## 2019-11-23 NOTE — Telephone Encounter (Signed)
Copied from [id number]. Topic: General - Other >> Nov 23, 2019 10:43 AM Celene Kras wrote: Reason for CRM: Pt called stating she is not understanding the results of her UA. Please advise.

## 2019-11-24 ENCOUNTER — Other Ambulatory Visit: Payer: Self-pay | Admitting: Family Medicine

## 2019-11-24 DIAGNOSIS — R300 Dysuria: Secondary | ICD-10-CM

## 2019-11-24 DIAGNOSIS — R350 Frequency of micturition: Secondary | ICD-10-CM

## 2019-11-24 NOTE — Telephone Encounter (Signed)
Pt notified of results and referral placed.  Nothing further needed.

## 2019-11-27 ENCOUNTER — Other Ambulatory Visit: Payer: Self-pay | Admitting: Adult Health

## 2019-11-27 DIAGNOSIS — I10 Essential (primary) hypertension: Secondary | ICD-10-CM

## 2019-12-01 NOTE — Telephone Encounter (Signed)
She should still be taking it. She just isn't taking it correctly- obviously.

## 2019-12-02 NOTE — Telephone Encounter (Signed)
Sent to the pharmacy by e-scribe for 90 days. 

## 2019-12-03 ENCOUNTER — Other Ambulatory Visit: Payer: Self-pay | Admitting: Adult Health

## 2019-12-03 DIAGNOSIS — R350 Frequency of micturition: Secondary | ICD-10-CM

## 2019-12-16 ENCOUNTER — Encounter: Payer: Self-pay | Admitting: Adult Health

## 2019-12-16 ENCOUNTER — Other Ambulatory Visit: Payer: Self-pay

## 2019-12-16 ENCOUNTER — Telehealth: Payer: Self-pay

## 2019-12-16 ENCOUNTER — Telehealth (INDEPENDENT_AMBULATORY_CARE_PROVIDER_SITE_OTHER): Payer: Medicare Other | Admitting: Adult Health

## 2019-12-16 DIAGNOSIS — Z20822 Contact with and (suspected) exposure to covid-19: Secondary | ICD-10-CM

## 2019-12-16 NOTE — Progress Notes (Signed)
Virtual Visit via Video Note  I connected with Mikayla Sweeney on 12/16/19 at  2:30 PM EST by a video enabled telemedicine application and verified that I am speaking with the correct person using two identifiers.  Location patient: home Location provider:work or home office Persons participating in the virtual visit: patient, provider  I discussed the limitations of evaluation and management by telemedicine and the availability of in person appointments. The patient expressed understanding and agreed to proceed.   HPI: 59 year old female who is being evaluated today for an acute issue.  Her symptoms started 3 days ago with postnasal drip and a "scratchy throat".  She woke up this morning she had developed a headache, fatigue, sore throat, and swollen lymph nodes on the left side of her chin.  She denies loss of taste or smell, fever, chills, diarrhea, shortness of breath, or chest pain.  Current temperature is 96.9, blood pressure 118/75  She denies being around sick contacts or anybody with Covid-like symptoms   ROS: See pertinent positives and negatives per HPI.  Past Medical History:  Diagnosis Date  . Anxiety   . Asthma   . B12 deficiency   . Bipolar 1 disorder (HCC)   . CHF (congestive heart failure) (HCC)   . Epilepsy (HCC)   . Hypertension   . Sarcoidosis     Past Surgical History:  Procedure Laterality Date  . TONSILLECTOMY    . TUBAL LIGATION      Family History  Problem Relation Age of Onset  . Pancreatic cancer Mother   . Hypertension Mother   . Diabetes Mother   . Heart disease Father   . Gout Father   . Cirrhosis Father   . Heart disease Sister        CHF     Current Outpatient Medications:  .  albuterol (PROVENTIL HFA;VENTOLIN HFA) 108 (90 Base) MCG/ACT inhaler, Inhale 1-2 puffs into the lungs every 6 (six) hours as needed. For wheeze or shortness of breath, Disp: 1 Inhaler, Rfl: 0 .  amLODipine (NORVASC) 10 MG tablet, Take 1 tablet (10 mg total) by  mouth daily., Disp: 90 tablet, Rfl: 3 .  carvedilol (COREG) 25 MG tablet, TAKE 1 TABLET (25 MG TOTAL) BY MOUTH 2 (TWO) TIMES DAILY WITH A MEAL., Disp: 180 tablet, Rfl: 1 .  cyclobenzaprine (FLEXERIL) 10 MG tablet, Take 1 tablet (10 mg total) by mouth at bedtime., Disp: 5 tablet, Rfl: 0 .  fluticasone (FLONASE) 50 MCG/ACT nasal spray, Place 2 sprays into both nostrils daily., Disp: 1 g, Rfl: 0 .  furosemide (LASIX) 20 MG tablet, Take 1 tablet (20 mg total) by mouth daily., Disp: 90 tablet, Rfl: 1 .  hydrochlorothiazide (HYDRODIURIL) 25 MG tablet, TAKE 1 TABLET BY MOUTH EVERY DAY, Disp: 90 tablet, Rfl: 0 .  ibuprofen (ADVIL,MOTRIN) 600 MG tablet, Take 1 tablet (600 mg total) by mouth every 8 (eight) hours as needed., Disp: 30 tablet, Rfl: 0 .  ipratropium (ATROVENT) 0.06 % nasal spray, Place 2 sprays into both nostrils 4 (four) times daily., Disp: 15 mL, Rfl: 0 .  losartan (COZAAR) 100 MG tablet, Take 1 tablet (100 mg total) by mouth daily., Disp: 90 tablet, Rfl: 3 .  naproxen (NAPROSYN) 500 MG tablet, TAKE 1 TABLET BY MOUTH THREE TIMES A DAY WITH MEALS, Disp: 60 tablet, Rfl: 0 .  omeprazole (PRILOSEC) 20 MG capsule, Take 1 capsule (20 mg total) by mouth daily., Disp: 90 capsule, Rfl: 3 .  oxybutynin (DITROPAN-XL) 5 MG 24 hr  tablet, TAKE 1 TABLET BY MOUTH EVERYDAY AT BEDTIME, Disp: 90 tablet, Rfl: 1  EXAM:  VITALS per patient if applicable:  GENERAL: alert, oriented, appears well and in no acute distress  HEENT: atraumatic, conjunttiva clear, no obvious abnormalities on inspection of external nose and ears  NECK: normal movements of the head and neck  LUNGS: on inspection no signs of respiratory distress, breathing rate appears normal, no obvious gross SOB, gasping or wheezing  CV: no obvious cyanosis  MS: moves all visible extremities without noticeable abnormality  PSYCH/NEURO: pleasant and cooperative, no obvious depression or anxiety, speech and thought processing grossly  intact  ASSESSMENT AND PLAN:  Discussed the following assessment and plan:  1. Suspected COVID-19 virus infection -Due to symptoms, advised patient that she should be tested for COVID-19.  Local testing sites given.  She was advised to stay quarantined until her results come back.  Stay hydrated and rest.  She can use Tylenol or Motrin for symptom relief. She is agreeable to this plan     I discussed the assessment and treatment plan with the patient. The patient was provided an opportunity to ask questions and all were answered. The patient agreed with the plan and demonstrated an understanding of the instructions.   The patient was advised to call back or seek an in-person evaluation if the symptoms worsen or if the condition fails to improve as anticipated.   Dorothyann Peng, NP

## 2019-12-16 NOTE — Telephone Encounter (Signed)
Patient called to check status of call back from message below. Please call Ph#  [phone]

## 2019-12-16 NOTE — Telephone Encounter (Signed)
Copied from CRM [id number]. Topic: General - Other >> Dec 16, 2019 11:32 AM Gwenlyn Fudge wrote: Reason for CRM: Pt called stating she has a post nasal drip and sore throat. She declined setting up a virtual visit until she hears from a nurse. Please advise.

## 2019-12-16 NOTE — Telephone Encounter (Signed)
Spoke to pt and set her up with a VV with PCP

## 2020-01-19 ENCOUNTER — Other Ambulatory Visit: Payer: Self-pay

## 2020-01-19 ENCOUNTER — Telehealth (INDEPENDENT_AMBULATORY_CARE_PROVIDER_SITE_OTHER): Payer: Medicare Other | Admitting: Family Medicine

## 2020-01-19 ENCOUNTER — Encounter: Payer: Self-pay | Admitting: Family Medicine

## 2020-01-19 DIAGNOSIS — H5789 Other specified disorders of eye and adnexa: Secondary | ICD-10-CM | POA: Diagnosis not present

## 2020-01-19 DIAGNOSIS — H00014 Hordeolum externum left upper eyelid: Secondary | ICD-10-CM

## 2020-01-19 MED ORDER — ERYTHROMYCIN 5 MG/GM OP OINT: 1.0000 "application " | TOPICAL_OINTMENT | Freq: Every day | OPHTHALMIC | 0 refills | Status: DC

## 2020-01-19 NOTE — Patient Instructions (Signed)
-  I sent the medication(s) we discussed to your pharmacy: Meds ordered this encounter  Medications  . erythromycin ophthalmic ointment    Sig: Place 1 application into the left eye at bedtime.    Dispense:  3.5 g    Refill:  0    Please let us know if you have any questions or concerns regarding this prescription.  I hope you are feeling better soon! Seek care promptly if your symptoms worsen, new concerns arise or you are not improving with treatment.

## 2020-01-19 NOTE — Progress Notes (Signed)
Virtual Visit via Video Note  I connected with Mikayla Sweeney  on 01/19/20 at 12:00 PM EST by a video enabled telemedicine application and verified that I am speaking with the correct person using two identifiers.  Location patient: home Location provider:work or home office Persons participating in the virtual visit: patient, provider  I discussed the limitations of evaluation and management by telemedicine and the availability of in person appointments. The patient expressed understanding and agreed to proceed.   HPI:  Acute visit for an eye issues: -started 3 days ago -symptoms: L upper eyelid swelling/bump on lid, tenderness, a little white drainage - eye has been ok -no visual disturbance, redness of eyeball, fevers, HA -she has had a stye before, but was not as tender  ROS: See pertinent positives and negatives per HPI.  Past Medical History:  Diagnosis Date  . Anxiety   . Asthma   . B12 deficiency   . Bipolar 1 disorder (HCC)   . CHF (congestive heart failure) (HCC)   . Epilepsy (HCC)   . Hypertension   . Sarcoidosis     Past Surgical History:  Procedure Laterality Date  . TONSILLECTOMY    . TUBAL LIGATION      Family History  Problem Relation Age of Onset  . Pancreatic cancer Mother   . Hypertension Mother   . Diabetes Mother   . Heart disease Father   . Gout Father   . Cirrhosis Father   . Heart disease Sister        CHF    SOCIAL HX: see hpi   Current Outpatient Medications:  .  albuterol (PROVENTIL HFA;VENTOLIN HFA) 108 (90 Base) MCG/ACT inhaler, Inhale 1-2 puffs into the lungs every 6 (six) hours as needed. For wheeze or shortness of breath, Disp: 1 Inhaler, Rfl: 0 .  amLODipine (NORVASC) 10 MG tablet, Take 1 tablet (10 mg total) by mouth daily., Disp: 90 tablet, Rfl: 3 .  cyclobenzaprine (FLEXERIL) 10 MG tablet, Take 1 tablet (10 mg total) by mouth at bedtime., Disp: 5 tablet, Rfl: 0 .  fluticasone (FLONASE) 50 MCG/ACT nasal spray, Place 2 sprays into  both nostrils daily., Disp: 1 g, Rfl: 0 .  furosemide (LASIX) 20 MG tablet, Take 1 tablet (20 mg total) by mouth daily., Disp: 90 tablet, Rfl: 1 .  hydrochlorothiazide (HYDRODIURIL) 25 MG tablet, TAKE 1 TABLET BY MOUTH EVERY DAY, Disp: 90 tablet, Rfl: 0 .  ibuprofen (ADVIL,MOTRIN) 600 MG tablet, Take 1 tablet (600 mg total) by mouth every 8 (eight) hours as needed., Disp: 30 tablet, Rfl: 0 .  ipratropium (ATROVENT) 0.06 % nasal spray, Place 2 sprays into both nostrils 4 (four) times daily., Disp: 15 mL, Rfl: 0 .  losartan (COZAAR) 100 MG tablet, Take 1 tablet (100 mg total) by mouth daily., Disp: 90 tablet, Rfl: 3 .  naproxen (NAPROSYN) 500 MG tablet, TAKE 1 TABLET BY MOUTH THREE TIMES A DAY WITH MEALS, Disp: 60 tablet, Rfl: 0 .  omeprazole (PRILOSEC) 20 MG capsule, Take 1 capsule (20 mg total) by mouth daily., Disp: 90 capsule, Rfl: 3 .  oxybutynin (DITROPAN-XL) 5 MG 24 hr tablet, TAKE 1 TABLET BY MOUTH EVERYDAY AT BEDTIME, Disp: 90 tablet, Rfl: 1 .  carvedilol (COREG) 25 MG tablet, TAKE 1 TABLET (25 MG TOTAL) BY MOUTH 2 (TWO) TIMES DAILY WITH A MEAL., Disp: 180 tablet, Rfl: 1 .  erythromycin ophthalmic ointment, Place 1 application into the left eye at bedtime., Disp: 3.5 g, Rfl: 0  EXAM:  VITALS  per patient if applicable:denies fever  GENERAL: alert, oriented, appears well and in no acute distress  HEENT: atraumatic, conjunttiva clear, small papule on L upper eyelid with some surrounding erythema/edema of the upper L eyelid no obvious abnormalities on inspection of external nose and ears  NECK: normal movements of the head and neck  LUNGS: on inspection no signs of respiratory distress, breathing rate appears normal, no obvious gross SOB, gasping or wheezing  CV: no obvious cyanosis  MS: moves all visible extremities without noticeable abnormality  PSYCH/NEURO: pleasant and cooperative, no obvious depression or anxiety, speech and thought processing grossly intact  ASSESSMENT AND  PLAN:  Discussed the following assessment and plan:  Hordeolum externum of left upper eyelid  Eye drainage  -we discussed possible serious and likely etiologies, options for evaluation and workup, limitations of telemedicine visit vs in person visit, treatment, treatment risks and precautions. Pt prefers to treat via telemedicine empirically rather then risking or undertaking an in person visit at this moment. Compresses. Erythro optho ointment given drainage and some tenderness.  Patient agrees to seek prompt in person care if worsening, new symptoms arise, or if is not improving with treatment. Advised optho eval if worsening or if does not resolve as expected.   I discussed the assessment and treatment plan with the patient. The patient was provided an opportunity to ask questions and all were answered. The patient agreed with the plan and demonstrated an understanding of the instructions.   The patient was advised to call back or seek an in-person evaluation if the symptoms worsen or if the condition fails to improve as anticipated.   Lucretia Kern, DO   Patient Instructions   -I sent the medication(s) we discussed to your pharmacy: Meds ordered this encounter  Medications  . erythromycin ophthalmic ointment    Sig: Place 1 application into the left eye at bedtime.    Dispense:  3.5 g    Refill:  0    Please let us know if you have any questions or concerns regarding this prescription.  I hope you are feeling better soon! Seek care promptly if your symptoms worsen, new concerns arise or you are not improving with treatment.

## 2020-02-29 ENCOUNTER — Other Ambulatory Visit: Payer: Self-pay | Admitting: Adult Health

## 2020-02-29 DIAGNOSIS — I10 Essential (primary) hypertension: Secondary | ICD-10-CM

## 2020-03-11 ENCOUNTER — Other Ambulatory Visit: Payer: Self-pay | Admitting: Adult Health

## 2020-03-11 DIAGNOSIS — I10 Essential (primary) hypertension: Secondary | ICD-10-CM

## 2020-03-14 NOTE — Telephone Encounter (Signed)
Sent to the pharmacy by e-scribe for 30 days.  Pt is due for cpx.

## 2020-04-09 ENCOUNTER — Other Ambulatory Visit: Payer: Self-pay | Admitting: Adult Health

## 2020-04-09 DIAGNOSIS — I10 Essential (primary) hypertension: Secondary | ICD-10-CM

## 2020-04-12 NOTE — Telephone Encounter (Signed)
DENIED.  PT MUST CALL TO SCHEDULE YEARLY PHYSICAL.

## 2020-04-14 ENCOUNTER — Other Ambulatory Visit: Payer: Self-pay | Admitting: Adult Health

## 2020-04-14 DIAGNOSIS — I10 Essential (primary) hypertension: Secondary | ICD-10-CM

## 2020-04-14 NOTE — Telephone Encounter (Signed)
Denied.  Due for cpx. 

## 2020-04-16 ENCOUNTER — Other Ambulatory Visit: Payer: Self-pay | Admitting: Adult Health

## 2020-04-16 DIAGNOSIS — I10 Essential (primary) hypertension: Secondary | ICD-10-CM

## 2020-04-28 ENCOUNTER — Other Ambulatory Visit: Payer: Self-pay | Admitting: Adult Health

## 2020-04-28 DIAGNOSIS — I10 Essential (primary) hypertension: Secondary | ICD-10-CM

## 2020-04-28 NOTE — Telephone Encounter (Signed)
Denied.  Pt is past due for cpx. 

## 2020-05-01 ENCOUNTER — Other Ambulatory Visit: Payer: Self-pay | Admitting: Adult Health

## 2020-05-01 DIAGNOSIS — I10 Essential (primary) hypertension: Secondary | ICD-10-CM

## 2020-05-02 NOTE — Telephone Encounter (Signed)
Pt is calling in stating that she is out of her Rx losartan 100 MG and amlodipine 10 MG   Pharm:  [facility] on [address].  She is aware that [name] is not in the office on Monday, but they will address it when they come back in the office on Tuesday.

## 2020-05-27 ENCOUNTER — Other Ambulatory Visit: Payer: Self-pay | Admitting: Adult Health

## 2020-05-27 DIAGNOSIS — I10 Essential (primary) hypertension: Secondary | ICD-10-CM

## 2020-05-27 NOTE — Telephone Encounter (Signed)
SENT TO THE PHARMACY BY E-SCRIBE FOR 30 DAYS.  HAS UPCOMING CPX ON 06/17/20.

## 2020-05-31 NOTE — Telephone Encounter (Signed)
Sent to the pharmacy by e-scribe. 

## 2020-06-01 ENCOUNTER — Other Ambulatory Visit: Payer: Self-pay

## 2020-06-01 ENCOUNTER — Other Ambulatory Visit (INDEPENDENT_AMBULATORY_CARE_PROVIDER_SITE_OTHER): Payer: Medicare Other

## 2020-06-01 ENCOUNTER — Telehealth: Payer: Self-pay | Admitting: *Deleted

## 2020-06-01 DIAGNOSIS — R300 Dysuria: Secondary | ICD-10-CM | POA: Diagnosis not present

## 2020-06-01 LAB — POC URINALSYSI DIPSTICK (AUTOMATED)
Bilirubin, UA: NEGATIVE
Glucose, UA: NEGATIVE
Nitrite, UA: NEGATIVE
Protein, UA: POSITIVE — AB
Spec Grav, UA: >=1.03 — AB (ref 1.010–1.025)
Urobilinogen, UA: 0.2 E.U./dL
pH, UA: 5.5 (ref 5.0–8.0)

## 2020-06-01 NOTE — Telephone Encounter (Signed)
Mikayla Sweeney stated she sent a message to Dr Hassan Rowan stating the pt has an appt for a questionable UTI and she asked if orders could be entered for urine testing.  Urinalysis order entered per Dr Hassan Rowan.

## 2020-06-02 ENCOUNTER — Telehealth (INDEPENDENT_AMBULATORY_CARE_PROVIDER_SITE_OTHER): Payer: Medicare Other | Admitting: Family Medicine

## 2020-06-02 ENCOUNTER — Encounter: Payer: Self-pay | Admitting: Family Medicine

## 2020-06-02 DIAGNOSIS — Z7189 Other specified counseling: Secondary | ICD-10-CM | POA: Diagnosis not present

## 2020-06-02 DIAGNOSIS — Z124 Encounter for screening for malignant neoplasm of cervix: Secondary | ICD-10-CM

## 2020-06-02 DIAGNOSIS — R300 Dysuria: Secondary | ICD-10-CM

## 2020-06-02 DIAGNOSIS — Z1239 Encounter for other screening for malignant neoplasm of breast: Secondary | ICD-10-CM

## 2020-06-02 MED ORDER — NITROFURANTOIN MONOHYD MACRO 100 MG PO CAPS
100.0000 mg | ORAL_CAPSULE | Freq: Two times a day (BID) | ORAL | 0 refills | Status: DC
Start: 2020-06-02 — End: 2020-06-04

## 2020-06-02 NOTE — Addendum Note (Signed)
Addended by: Lerry Liner on: 06/02/2020 09:09 AM   Modules accepted: Orders

## 2020-06-02 NOTE — Progress Notes (Signed)
Virtual Visit via Telephone Note  I connected with Mikayla Sweeney on 06/02/20 at 10:20 AM EDT by telephone and verified that I am speaking with the correct person using two identifiers.   I discussed the limitations, risks, security and privacy concerns of performing an evaluation and management service by telephone and the availability of in person appointments. I also discussed with the patient that there may be a patient responsible charge related to this service. The patient expressed understanding and agreed to proceed.  Location patient: home Location provider: work or home office, [place] Participants present for the call: patient, provider Patient did not have a visit in the prior 7 days to address this/these issue(s).   History of Present Illness:    Acute visit for Dysuria: -started about 5 days ago -symptoms include dysuria, frequency, urgency, some mild nausea -reports she has had urinary tract infections in the past and this feels similar -she had a udip and culture is pending -denies fevers, flank pain, vag symptoms, vomiting, diarrhea, hematuria  Pfizer 1st dose 03/24/20, 2nd dose 04/18/20 - but not updated in chart Due for mammo and pap per epic preventive care documentation.  Observations/Objective: Patient sounds cheerful and well on the phone. I do not appreciate any SOB. Speech and thought processing are grossly intact. Patient reported vitals:  Assessment and Plan:  Dysuria  Counseling on health care directive  Encounter for screening for malignant neoplasm of breast, unspecified screening modality  Cervical cancer screening  -we discussed possible serious and likely etiologies, options for evaluation and workup, limitations of telemedicine visit vs in person visit, treatment, treatment risks and precautions. Pt prefers to treat via telemedicine empirically rather then risking or undertaking an in person visit at this moment. She opted to try empiric treatment  for possible UTI rather than waiting on culture results from PCP office - Macrobid 100mg  bid x 7 days.  Discussed risks, return and emergency precautions.Patient agrees to seek prompt in person care if worsening, new symptoms arise, or if is not improving with treatment. Also reviewed and discussed breast cancer screening, cervical cancer screening and offered assistance. COVID19 vaccines done per pt but not documented properly in chart - will notify PCP office to assist in correcting in chart.   Follow Up Instructions:   I did not refer this patient for an OV in the next 24 hours for this/these issue(s).  I discussed the assessment and treatment plan with the patient. The patient was provided an opportunity to ask questions and all were answered. The patient agreed with the plan and demonstrated an understanding of the instructions.   The patient was advised to call back or seek an in-person evaluation if the symptoms worsen or if the condition fails to improve as anticipated.  I provided 18 minutes of non-face-to-face time during this encounter.   , DO

## 2020-06-04 ENCOUNTER — Other Ambulatory Visit: Payer: Self-pay | Admitting: Family Medicine

## 2020-06-04 LAB — URINE CULTURE
MICRO NUMBER:: 10630386
SPECIMEN QUALITY:: ADEQUATE

## 2020-06-04 MED ORDER — CEPHALEXIN 500 MG PO CAPS: 500.0000 mg | ORAL_CAPSULE | Freq: Two times a day (BID) | ORAL | 0 refills | Status: DC

## 2020-06-08 ENCOUNTER — Telehealth: Payer: Self-pay | Admitting: Family Medicine

## 2020-06-08 DIAGNOSIS — N390 Urinary tract infection, site not specified: Secondary | ICD-10-CM

## 2020-06-08 NOTE — Telephone Encounter (Signed)
Pt saw Dr. Selena Batten for UTI. The antibiotic she is on now is not relieving any of her symptoms and wonders if she needs to be on a different antibiotic?   Pt can be reached at [phone]

## 2020-06-09 MED ORDER — CIPROFLOXACIN HCL 500 MG PO TABS
500.0000 mg | ORAL_TABLET | Freq: Two times a day (BID) | ORAL | 0 refills | Status: AC
Start: 2020-06-09 — End: 2020-06-12

## 2020-06-09 NOTE — Telephone Encounter (Signed)
Pt called to check on her message and that it is very urgent.

## 2020-06-09 NOTE — Addendum Note (Signed)
Addended by: Raj Janus T on: 06/09/2020 04:45 PM   Modules accepted: Orders

## 2020-06-09 NOTE — Telephone Encounter (Signed)
She should be susceptible to Keflex.   Can have her stop it and send in Cipro 500 mg BID x 3 days. She needs to take probiotics with this and continue with probiotics for 2 weeks after completion of abx   Also send to urology d/t chronic nature of UTIs

## 2020-06-09 NOTE — Telephone Encounter (Signed)
Spoke to [place] and informed her of the below message.  Cipro sent to the pharmacy.  Instructed her to pick up probiotic and take 2 weeks after completion of the antibiotic.  Referral to urology placed in Epic.

## 2020-06-09 NOTE — Telephone Encounter (Signed)
Spoke to the pt.  Originally prescribed Macrobid but changed when the urine culture came back.  Now taking cephALEXin (KEFLEX) 500 MG capsule.  She has not completed this medication.  She still has 1 day left.  Says her symptoms are getting worse.  Pt c/o nausea, frequency of urination, light headed and just over all feeling sick.  Would like a change in medication.  Please advise.

## 2020-06-10 ENCOUNTER — Other Ambulatory Visit: Payer: Self-pay | Admitting: Adult Health

## 2020-06-10 DIAGNOSIS — R350 Frequency of micturition: Secondary | ICD-10-CM

## 2020-06-10 NOTE — Telephone Encounter (Signed)
Sent to the pharmacy by e-scribe. 

## 2020-06-17 ENCOUNTER — Encounter: Payer: Self-pay | Admitting: Adult Health

## 2020-06-17 ENCOUNTER — Other Ambulatory Visit: Payer: Self-pay

## 2020-06-17 ENCOUNTER — Ambulatory Visit (INDEPENDENT_AMBULATORY_CARE_PROVIDER_SITE_OTHER): Payer: Medicare Other | Admitting: Adult Health

## 2020-06-17 VITALS — BP 128/88 | Temp 97.6°F | Ht 64.0 in | Wt 205.0 lb

## 2020-06-17 DIAGNOSIS — I5031 Acute diastolic (congestive) heart failure: Secondary | ICD-10-CM | POA: Diagnosis not present

## 2020-06-17 DIAGNOSIS — K2100 Gastro-esophageal reflux disease with esophagitis, without bleeding: Secondary | ICD-10-CM

## 2020-06-17 DIAGNOSIS — I10 Essential (primary) hypertension: Secondary | ICD-10-CM | POA: Diagnosis not present

## 2020-06-17 DIAGNOSIS — F319 Bipolar disorder, unspecified: Secondary | ICD-10-CM

## 2020-06-17 DIAGNOSIS — Z0000 Encounter for general adult medical examination without abnormal findings: Secondary | ICD-10-CM | POA: Diagnosis not present

## 2020-06-17 MED ORDER — AMLODIPINE BESYLATE 10 MG PO TABS: 10.0000 mg | ORAL_TABLET | Freq: Every day | ORAL | 3 refills | Status: DC

## 2020-06-17 MED ORDER — LOSARTAN POTASSIUM 100 MG PO TABS: 100.0000 mg | ORAL_TABLET | Freq: Every day | ORAL | 3 refills | Status: DC

## 2020-06-17 MED ORDER — HYDROCHLOROTHIAZIDE 25 MG PO TABS: 25.0000 mg | ORAL_TABLET | Freq: Every day | ORAL | 3 refills | Status: DC

## 2020-06-17 MED ORDER — FUROSEMIDE 20 MG PO TABS: 20.0000 mg | ORAL_TABLET | Freq: Every day | ORAL | 3 refills | Status: DC

## 2020-06-17 MED ORDER — CARVEDILOL 25 MG PO TABS: 25.0000 mg | ORAL_TABLET | Freq: Two times a day (BID) | ORAL | 1 refills | Status: DC

## 2020-06-17 NOTE — Patient Instructions (Signed)
It was great seeing you today   Congrats on making some positive changes in your lifestyle!  We will follow up with you about your blood work   Please follow up in one year or sooner if needed

## 2020-06-17 NOTE — Progress Notes (Signed)
Subjective:    Patient ID: Mikayla Sweeney, female    DOB: 13-Jul-1961, 59 y.o.   MRN: [id number]  HPI Patient presents for yearly preventative medicine examination. She is a pleasant 59 year old female who  has a past medical history of Anxiety, Asthma, B12 deficiency, Bipolar 1 disorder (HCC), CHF (congestive heart failure) (HCC), Epilepsy (HCC), Hypertension, and Sarcoidosis.  Essential hypertension-she is currently prescribed Norvasc 10 mg, Coreg 25 mg, HCTZ 25 mg and Cozaar 100 mg.  She denies headaches, blurred vision, lightheadedness, or dizziness.  BP Readings from Last 3 Encounters:  06/17/20 (!) 128/94  12/24/18 130/80  12/17/18 130/88    CHF-symptoms controlled with Lasix.  She denies chest pain, shortness of breath, or lower extremity swelling.  GERD -controlled with Prilosec 20 mg daily.  OAB-symptoms controlled with Ditropan 5 mg at bedtime  History of bipolar disorder, not currently on medications as an has not been for a while.  She denies symptoms  All immunizations and health maintenance protocols were reviewed with the patient and needed orders were placed.  Appropriate screening laboratory values were ordered for the patient including screening of hyperlipidemia, renal function and hepatic function.  Medication reconciliation,  past medical history, social history, problem list and allergies were reviewed in detail with the patient  Goals were established with regard to weight loss, exercise, and  diet in compliance with medications. She has started a plant based diet and just started out with a personal trainer twice a week.   Wt Readings from Last 3 Encounters:  06/17/20 205 lb (93 kg)  12/24/18 195 lb (88.5 kg)  12/17/18 192 lb (87.1 kg)   She is up-to-date on routine colon cancer screening. She is going to schedule an appointment with GYN for mammogram and PAP  Review of Systems  Constitutional: Negative.   HENT: Negative.   Eyes: Negative.    Respiratory: Negative.   Cardiovascular: Negative.   Gastrointestinal: Negative.   Endocrine: Negative.   Genitourinary: Negative.   Musculoskeletal: Negative.   Skin: Negative.   Allergic/Immunologic: Negative.   Neurological: Negative.   Hematological: Negative.   Psychiatric/Behavioral: Negative.    Past Medical History:  Diagnosis Date  . Anxiety   . Asthma   . B12 deficiency   . Bipolar 1 disorder (HCC)   . CHF (congestive heart failure) (HCC)   . Epilepsy (HCC)   . Hypertension   . Sarcoidosis     Social History   Socioeconomic History  . Marital status: Married    Spouse name: Not on file  . Number of children: Not on file  . Years of education: Not on file  . Highest education level: Not on file  Occupational History  . Not on file  Tobacco Use  . Smoking status: Former Games developer  . Smokeless tobacco: Never Used  Vaping Use  . Vaping Use: Never used  Substance and Sexual Activity  . Alcohol use: No  . Drug use: No  . Sexual activity: Not on file  Other Topics Concern  . Not on file  Social History Narrative   She has her own business    Married    Three children    5 grandchildren       She likes to spend time with her grandchildren    Social Determinants of Health   Financial Resource Strain:   . Difficulty of Paying Living Expenses:   Food Insecurity:   . Worried About Programme researcher, broadcasting/film/video in the  Last Year:   . Ran Out of Food in the Last Year:   Transportation Needs:   . Freight forwarderLack of Transportation (Medical):   Marland Kitchen. Lack of Transportation (Non-Medical):   Physical Activity:   . Days of Exercise per Week:   . Minutes of Exercise per Session:   Stress:   . Feeling of Stress :   Social Connections:   . Frequency of Communication with Friends and Family:   . Frequency of Social Gatherings with Friends and Family:   . Attends Religious Services:   . Active Member of Clubs or Organizations:   . Attends BankerClub or Organization Meetings:   Marland Kitchen. Marital  Status:   Intimate Partner Violence:   . Fear of Current or Ex-Partner:   . Emotionally Abused:   Marland Kitchen. Physically Abused:   . Sexually Abused:     Past Surgical History:  Procedure Laterality Date  . TONSILLECTOMY    . TUBAL LIGATION      Family History  Problem Relation Age of Onset  . Pancreatic cancer Mother   . Hypertension Mother   . Diabetes Mother   . Heart disease Father   . Gout Father   . Cirrhosis Father   . Heart disease Sister        CHF    No Known Allergies  Current Outpatient Medications on File Prior to Visit  Medication Sig Dispense Refill  . albuterol (PROVENTIL HFA;VENTOLIN HFA) 108 (90 Base) MCG/ACT inhaler Inhale 1-2 puffs into the lungs every 6 (six) hours as needed. For wheeze or shortness of breath 1 Inhaler 0  . amLODipine (NORVASC) 10 MG tablet TAKE 1 TABLET (10 MG TOTAL) BY MOUTH DAILY. **DUE FOR YEARLY PHYSICAL** 30 tablet 0  . fluticasone (FLONASE) 50 MCG/ACT nasal spray Place 2 sprays into both nostrils daily. 1 g 0  . furosemide (LASIX) 20 MG tablet Take 1 tablet (20 mg total) by mouth daily. 90 tablet 1  . hydrochlorothiazide (HYDRODIURIL) 25 MG tablet TAKE 1 TABLET BY MOUTH EVERY DAY 90 tablet 0  . ibuprofen (ADVIL,MOTRIN) 600 MG tablet Take 1 tablet (600 mg total) by mouth every 8 (eight) hours as needed. 30 tablet 0  . losartan (COZAAR) 100 MG tablet TAKE 1 TABLET (100 MG TOTAL) BY MOUTH DAILY. **DUE FOR YEARLY PHYSICAL** 30 tablet 0  . naproxen (NAPROSYN) 500 MG tablet TAKE 1 TABLET BY MOUTH THREE TIMES A DAY WITH MEALS 60 tablet 0  . omeprazole (PRILOSEC) 20 MG capsule Take 1 capsule (20 mg total) by mouth daily. 90 capsule 3  . oxybutynin (DITROPAN-XL) 5 MG 24 hr tablet TAKE 1 TABLET BY MOUTH EVERYDAY AT BEDTIME 90 tablet 1  . carvedilol (COREG) 25 MG tablet TAKE 1 TABLET (25 MG TOTAL) BY MOUTH 2 (TWO) TIMES DAILY WITH A MEAL. 180 tablet 1   No current facility-administered medications on file prior to visit.    BP (!) 128/94   Temp  97.6 F (36.4 C)   Ht 5\' 4"  (1.626 m)   Wt 205 lb (93 kg)   LMP 12/10/2014   BMI 35.19 kg/m       Objective:   Physical Exam Vitals and nursing note reviewed.  Constitutional:      General: She is not in acute distress.    Appearance: Normal appearance. She is well-developed. She is obese. She is not ill-appearing.  HENT:     Head: Normocephalic and atraumatic.     Right Ear: Tympanic membrane, ear canal and external ear normal.  There is no impacted cerumen.     Left Ear: Tympanic membrane, ear canal and external ear normal. There is no impacted cerumen.     Nose: Nose normal. No congestion or rhinorrhea.     Mouth/Throat:     Mouth: Mucous membranes are moist.     Pharynx: Oropharynx is clear. No oropharyngeal exudate or posterior oropharyngeal erythema.  Eyes:     General:        Right eye: No discharge.        Left eye: No discharge.     Extraocular Movements: Extraocular movements intact.     Conjunctiva/sclera: Conjunctivae normal.     Pupils: Pupils are equal, round, and reactive to light.  Neck:     Thyroid: No thyromegaly.     Vascular: No carotid bruit.     Trachea: No tracheal deviation.  Cardiovascular:     Rate and Rhythm: Normal rate and regular rhythm.     Pulses: Normal pulses.     Heart sounds: Normal heart sounds. No murmur heard.  No friction rub. No gallop.   Pulmonary:     Effort: Pulmonary effort is normal. No respiratory distress.     Breath sounds: Normal breath sounds. No stridor. No wheezing, rhonchi or rales.  Chest:     Chest wall: No tenderness.  Abdominal:     General: Abdomen is flat. Bowel sounds are normal. There is no distension.     Palpations: Abdomen is soft. There is no mass.     Tenderness: There is no abdominal tenderness. There is no right CVA tenderness, left CVA tenderness, guarding or rebound.     Hernia: No hernia is present.  Musculoskeletal:        General: No swelling, tenderness, deformity or signs of injury. Normal  range of motion.     Cervical back: Normal range of motion and neck supple.     Right lower leg: No edema.     Left lower leg: No edema.  Lymphadenopathy:     Cervical: No cervical adenopathy.  Skin:    General: Skin is warm and dry.     Coloration: Skin is not jaundiced or pale.     Findings: No bruising, erythema, lesion or rash.  Neurological:     General: No focal deficit present.     Mental Status: She is alert and oriented to person, place, and time.     Cranial Nerves: No cranial nerve deficit.     Sensory: No sensory deficit.     Motor: No weakness.     Coordination: Coordination normal.     Gait: Gait normal.     Deep Tendon Reflexes: Reflexes normal.  Psychiatric:        Mood and Affect: Mood normal.        Behavior: Behavior normal.        Thought Content: Thought content normal.        Judgment: Judgment normal.       Assessment & Plan:  1. Routine general medical examination at a health care facility - Congratulated on making lifestyle modifications  - Encouraged to follow up with GYN  - Follow up in office in one year for next CPE or sooner if needed - CBC with Differential/Platelet - Comprehensive metabolic panel - Hemoglobin A1c - Lipid panel - TSH  2. Essential hypertension - No changes in BP meds - CBC with Differential/Platelet - Comprehensive metabolic panel - Hemoglobin A1c - Lipid panel - TSH - amLODipine (  NORVASC) 10 MG tablet; Take 1 tablet (10 mg total) by mouth daily.  Dispense: 90 tablet; Refill: 3 - furosemide (LASIX) 20 MG tablet; Take 1 tablet (20 mg total) by mouth daily.  Dispense: 90 tablet; Refill: 3 - carvedilol (COREG) 25 MG tablet; Take 1 tablet (25 mg total) by mouth 2 (two) times daily with a meal.  Dispense: 180 tablet; Refill: 1 - hydrochlorothiazide (HYDRODIURIL) 25 MG tablet; Take 1 tablet (25 mg total) by mouth daily.  Dispense: 90 tablet; Refill: 3 - losartan (COZAAR) 100 MG tablet; Take 1 tablet (100 mg total) by mouth  daily.  Dispense: 90 tablet; Refill: 3  3. Acute diastolic heart failure (HCC) - Continue with Lasix and Coreg  - CBC with Differential/Platelet - Comprehensive metabolic panel - Hemoglobin A1c - Lipid panel - TSH - furosemide (LASIX) 20 MG tablet; Take 1 tablet (20 mg total) by mouth daily.  Dispense: 90 tablet; Refill: 3 - carvedilol (COREG) 25 MG tablet; Take 1 tablet (25 mg total) by mouth 2 (two) times daily with a meal.  Dispense: 180 tablet; Refill: 1  4. Gastroesophageal reflux disease with esophagitis without hemorrhage - Continue with Prilosec  5. Bipolar 1 disorder (HCC) - Controlled symptoms without medications    Shirline Frees, NP

## 2020-06-18 LAB — CBC WITH DIFFERENTIAL/PLATELET
Absolute Monocytes: 516 cells/uL (ref 200–950)
Basophils Absolute: 39 cells/uL (ref 0–200)
Basophils Relative: 0.5 %
Eosinophils Absolute: 46 cells/uL (ref 15–500)
Eosinophils Relative: 0.6 %
HCT: 43.1 % (ref 35.0–45.0)
Hemoglobin: 14.4 g/dL (ref 11.7–15.5)
Lymphs Abs: 2295 cells/uL (ref 850–3900)
MCH: 29.7 pg (ref 27.0–33.0)
MCHC: 33.4 g/dL (ref 32.0–36.0)
MCV: 88.9 fL (ref 80.0–100.0)
MPV: 11.8 fL (ref 7.5–12.5)
Monocytes Relative: 6.7 %
Neutro Abs: 4805 cells/uL (ref 1500–7800)
Neutrophils Relative %: 62.4 %
Platelets: 254 10*3/uL (ref 140–400)
RBC: 4.85 10*6/uL (ref 3.80–5.10)
RDW: 13.3 % (ref 11.0–15.0)
Total Lymphocyte: 29.8 %
WBC: 7.7 10*3/uL (ref 3.8–10.8)

## 2020-06-18 LAB — HEMOGLOBIN A1C
Hgb A1c MFr Bld: 5.7 % of total Hgb — ABNORMAL HIGH (ref <5.7)
Mean Plasma Glucose: 117 (calc)
eAG (mmol/L): 6.5 (calc)

## 2020-06-18 LAB — COMPREHENSIVE METABOLIC PANEL
AG Ratio: 1.4 (calc) (ref 1.0–2.5)
ALT: 14 U/L (ref 6–29)
AST: 19 U/L (ref 10–35)
Albumin: 4.2 g/dL (ref 3.6–5.1)
Alkaline phosphatase (APISO): 74 U/L (ref 37–153)
BUN: 14 mg/dL (ref 7–25)
CO2: 29 mmol/L (ref 20–32)
Calcium: 9.5 mg/dL (ref 8.6–10.4)
Chloride: 104 mmol/L (ref 98–110)
Creat: 0.87 mg/dL (ref 0.50–1.05)
Globulin: 3.1 g/dL (calc) (ref 1.9–3.7)
Glucose, Bld: 118 mg/dL — ABNORMAL HIGH (ref 65–99)
Potassium: 3.7 mmol/L (ref 3.5–5.3)
Sodium: 142 mmol/L (ref 135–146)
Total Bilirubin: 0.5 mg/dL (ref 0.2–1.2)
Total Protein: 7.3 g/dL (ref 6.1–8.1)

## 2020-06-18 LAB — LIPID PANEL
Cholesterol: 243 mg/dL — ABNORMAL HIGH (ref <200)
HDL: 73 mg/dL (ref >=50)
LDL Cholesterol (Calc): 149 mg/dL (calc) — ABNORMAL HIGH
Non-HDL Cholesterol (Calc): 170 mg/dL (calc) — ABNORMAL HIGH (ref <130)
Total CHOL/HDL Ratio: 3.3 (calc) (ref <5.0)
Triglycerides: 98 mg/dL (ref <150)

## 2020-06-18 LAB — TSH: TSH: 1.2 mIU/L (ref 0.40–4.50)

## 2020-06-21 ENCOUNTER — Other Ambulatory Visit: Payer: Self-pay | Admitting: *Deleted

## 2020-06-21 MED ORDER — ROSUVASTATIN CALCIUM 10 MG PO TABS
10.0000 mg | ORAL_TABLET | Freq: Every day | ORAL | 3 refills | Status: DC
Start: 2020-06-21 — End: 2021-09-01

## 2020-07-21 ENCOUNTER — Telehealth (INDEPENDENT_AMBULATORY_CARE_PROVIDER_SITE_OTHER): Payer: Medicare Other | Admitting: Family Medicine

## 2020-07-21 ENCOUNTER — Encounter: Payer: Self-pay | Admitting: Family Medicine

## 2020-07-21 DIAGNOSIS — R0981 Nasal congestion: Secondary | ICD-10-CM | POA: Diagnosis not present

## 2020-07-21 DIAGNOSIS — R05 Cough: Secondary | ICD-10-CM | POA: Diagnosis not present

## 2020-07-21 DIAGNOSIS — H9209 Otalgia, unspecified ear: Secondary | ICD-10-CM

## 2020-07-21 DIAGNOSIS — R059 Cough, unspecified: Secondary | ICD-10-CM

## 2020-07-21 MED ORDER — BENZONATATE 100 MG PO CAPS: 100.0000 mg | ORAL_CAPSULE | Freq: Three times a day (TID) | ORAL | 0 refills | Status: DC | PRN

## 2020-07-21 NOTE — Progress Notes (Signed)
Virtual Visit via Video Note  I connected with Mikayla Sweeney  on 07/21/20 at  5:40 PM EDT by a video enabled telemedicine application and verified that I am speaking with the correct person using two identifiers.  Location patient: home Location provider:work or home office Persons participating in the virtual visit: patient, provider  I discussed the limitations of evaluation and management by telemedicine and the availability of in person appointments. The patient expressed understanding and agreed to proceed.   HPI:  Acute visit for an earache: -started 3 days ago -symptoms include: sinus congestion, R earache, nausea, HA, cough, more tired than usual -denies fever, SOB, CP, vomiting, diarrhea, malaise, drainage from the ear, hearing loss, loss of taste or smell -no known sick contacts -fully vaccinated with COVID19 with pfizer  ROS: See pertinent positives and negatives per HPI.  Past Medical History:  Diagnosis Date  . Anxiety   . Asthma   . B12 deficiency   . Bipolar 1 disorder (HCC)   . CHF (congestive heart failure) (HCC)   . Epilepsy (HCC)   . Hypertension   . Sarcoidosis     Past Surgical History:  Procedure Laterality Date  . TONSILLECTOMY    . TUBAL LIGATION      Family History  Problem Relation Age of Onset  . Pancreatic cancer Mother   . Hypertension Mother   . Diabetes Mother   . Heart disease Father   . Gout Father   . Cirrhosis Father   . Heart disease Sister        CHF    SOCIAL HX: see hpi   Current Outpatient Medications:  .  albuterol (PROVENTIL HFA;VENTOLIN HFA) 108 (90 Base) MCG/ACT inhaler, Inhale 1-2 puffs into the lungs every 6 (six) hours as needed. For wheeze or shortness of breath, Disp: 1 Inhaler, Rfl: 0 .  amLODipine (NORVASC) 10 MG tablet, Take 1 tablet (10 mg total) by mouth daily., Disp: 90 tablet, Rfl: 3 .  carvedilol (COREG) 25 MG tablet, Take 1 tablet (25 mg total) by mouth 2 (two) times daily with a meal., Disp: 180 tablet, Rfl:  1 .  fluticasone (FLONASE) 50 MCG/ACT nasal spray, Place 2 sprays into both nostrils daily., Disp: 1 g, Rfl: 0 .  furosemide (LASIX) 20 MG tablet, Take 1 tablet (20 mg total) by mouth daily., Disp: 90 tablet, Rfl: 3 .  hydrochlorothiazide (HYDRODIURIL) 25 MG tablet, Take 1 tablet (25 mg total) by mouth daily., Disp: 90 tablet, Rfl: 3 .  ibuprofen (ADVIL,MOTRIN) 600 MG tablet, Take 1 tablet (600 mg total) by mouth every 8 (eight) hours as needed., Disp: 30 tablet, Rfl: 0 .  losartan (COZAAR) 100 MG tablet, Take 1 tablet (100 mg total) by mouth daily., Disp: 90 tablet, Rfl: 3 .  omeprazole (PRILOSEC) 20 MG capsule, Take 1 capsule (20 mg total) by mouth daily., Disp: 90 capsule, Rfl: 3 .  oxybutynin (DITROPAN-XL) 5 MG 24 hr tablet, TAKE 1 TABLET BY MOUTH EVERYDAY AT BEDTIME, Disp: 90 tablet, Rfl: 1 .  rosuvastatin (CRESTOR) 10 MG tablet, Take 1 tablet (10 mg total) by mouth daily., Disp: 90 tablet, Rfl: 3 .  benzonatate (TESSALON PERLES) 100 MG capsule, Take 1 capsule (100 mg total) by mouth 3 (three) times daily as needed., Disp: 20 capsule, Rfl: 0  EXAM:  VITALS per patient if applicable:  GENERAL: alert, oriented, appears well and in no acute distress  HEENT: atraumatic, conjunttiva clear, no obvious abnormalities on inspection of external nose and ears  NECK:  normal movements of the head and neck  LUNGS: on inspection no signs of respiratory distress, breathing rate appears normal, no obvious gross SOB, gasping or wheezing  CV: no obvious cyanosis  MS: moves all visible extremities without noticeable abnormality  PSYCH/NEURO: pleasant and cooperative, no obvious depression or anxiety, speech and thought processing grossly intact  ASSESSMENT AND PLAN:  Discussed the following assessment and plan:  Sinus congestion  Cough  Otalgia, unspecified laterality  -we discussed possible serious and likely etiologies, options for evaluation and workup, limitations of telemedicine visit vs  in person visit, treatment, treatment risks and precautions. Pt prefers to treat via telemedicine empirically rather then risking or undertaking an in person visit at this moment. Suspect VURI with ETD vs other. Possibility of covid given some increased breakthrough cases recently with delta. Discussed starting short course of afrin nasal spray for 3 days, nasal saline, tessalon for cough -rx sent. Discussed testing options and limitations for covid, provided treatment center information and home isolation information in case a positive result returns over the weekend. Patient agrees to seek prompt in person care if worsening, new symptoms arise, or if is not improving with treatment or ear not feeling better with the above treatment over the next few days.   I discussed the assessment and treatment plan with the patient. The patient was provided an opportunity to ask questions and all were answered. The patient agreed with the plan and demonstrated an understanding of the instructions.   The patient was advised to call back or seek an in-person evaluation if the symptoms worsen or if the condition fails to improve as anticipated.   Terressa Koyanagi, DO

## 2020-07-27 ENCOUNTER — Encounter: Payer: Self-pay | Admitting: Adult Health

## 2020-07-27 ENCOUNTER — Ambulatory Visit (INDEPENDENT_AMBULATORY_CARE_PROVIDER_SITE_OTHER): Payer: Medicare Other | Admitting: Adult Health

## 2020-07-27 ENCOUNTER — Other Ambulatory Visit: Payer: Self-pay

## 2020-07-27 VITALS — BP 126/90 | Temp 98.4°F | Wt 198.0 lb

## 2020-07-27 DIAGNOSIS — H65111 Acute and subacute allergic otitis media (mucoid) (sanguinous) (serous), right ear: Secondary | ICD-10-CM

## 2020-07-27 DIAGNOSIS — J302 Other seasonal allergic rhinitis: Secondary | ICD-10-CM | POA: Diagnosis not present

## 2020-07-27 NOTE — Progress Notes (Signed)
Subjective:    Patient ID: Mikayla Sweeney, female    DOB: 06/04/1961, 59 y.o.   MRN: [id number]  HPI 59 year old female who  has a past medical history of Anxiety, Asthma, B12 deficiency, Bipolar 1 disorder (HCC), CHF (congestive heart failure) (HCC), Epilepsy (HCC), Hypertension, and Sarcoidosis.  59 year old female who is being evaluated today for an acute issue of right-sided ear pain.  Her symptoms have been present for 1 to 2 weeks.  No shaded symptoms include rhinorrhea with clear drainage, sinus congestion, PND, and worsening seasonal allergy symptoms.  She denies drainage from the ear, fevers, chills, or feeling acutely ill.  She has not been using any over-the-counter allergy medication  Review of Systems See HPI   Past Medical History:  Diagnosis Date  . Anxiety   . Asthma   . B12 deficiency   . Bipolar 1 disorder (HCC)   . CHF (congestive heart failure) (HCC)   . Epilepsy (HCC)   . Hypertension   . Sarcoidosis     Social History   Socioeconomic History  . Marital status: Married    Spouse name: Not on file  . Number of children: Not on file  . Years of education: Not on file  . Highest education level: Not on file  Occupational History  . Not on file  Tobacco Use  . Smoking status: Former Games developer  . Smokeless tobacco: Never Used  Vaping Use  . Vaping Use: Never used  Substance and Sexual Activity  . Alcohol use: No  . Drug use: No  . Sexual activity: Not on file  Other Topics Concern  . Not on file  Social History Narrative   She has her own business    Married    Three children    5 grandchildren       She likes to spend time with her grandchildren    Social Determinants of Health   Financial Resource Strain:   . Difficulty of Paying Living Expenses:   Food Insecurity:   . Worried About Programme researcher, broadcasting/film/video in the Last Year:   . Barista in the Last Year:   Transportation Needs:   . Freight forwarder (Medical):   Marland Kitchen Lack of  Transportation (Non-Medical):   Physical Activity:   . Days of Exercise per Week:   . Minutes of Exercise per Session:   Stress:   . Feeling of Stress :   Social Connections:   . Frequency of Communication with Friends and Family:   . Frequency of Social Gatherings with Friends and Family:   . Attends Religious Services:   . Active Member of Clubs or Organizations:   . Attends Banker Meetings:   Marland Kitchen Marital Status:   Intimate Partner Violence:   . Fear of Current or Ex-Partner:   . Emotionally Abused:   Marland Kitchen Physically Abused:   . Sexually Abused:     Past Surgical History:  Procedure Laterality Date  . TONSILLECTOMY    . TUBAL LIGATION      Family History  Problem Relation Age of Onset  . Pancreatic cancer Mother   . Hypertension Mother   . Diabetes Mother   . Heart disease Father   . Gout Father   . Cirrhosis Father   . Heart disease Sister        CHF    No Known Allergies  Current Outpatient Medications on File Prior to Visit  Medication Sig Dispense Refill  .  albuterol (PROVENTIL HFA;VENTOLIN HFA) 108 (90 Base) MCG/ACT inhaler Inhale 1-2 puffs into the lungs every 6 (six) hours as needed. For wheeze or shortness of breath 1 Inhaler 0  . amLODipine (NORVASC) 10 MG tablet Take 1 tablet (10 mg total) by mouth daily. 90 tablet 3  . carvedilol (COREG) 25 MG tablet Take 1 tablet (25 mg total) by mouth 2 (two) times daily with a meal. 180 tablet 1  . fluticasone (FLONASE) 50 MCG/ACT nasal spray Place 2 sprays into both nostrils daily. 1 g 0  . furosemide (LASIX) 20 MG tablet Take 1 tablet (20 mg total) by mouth daily. 90 tablet 3  . hydrochlorothiazide (HYDRODIURIL) 25 MG tablet Take 1 tablet (25 mg total) by mouth daily. 90 tablet 3  . ibuprofen (ADVIL,MOTRIN) 600 MG tablet Take 1 tablet (600 mg total) by mouth every 8 (eight) hours as needed. 30 tablet 0  . losartan (COZAAR) 100 MG tablet Take 1 tablet (100 mg total) by mouth daily. 90 tablet 3  . omeprazole  (PRILOSEC) 20 MG capsule Take 1 capsule (20 mg total) by mouth daily. 90 capsule 3  . oxybutynin (DITROPAN-XL) 5 MG 24 hr tablet TAKE 1 TABLET BY MOUTH EVERYDAY AT BEDTIME 90 tablet 1  . rosuvastatin (CRESTOR) 10 MG tablet Take 1 tablet (10 mg total) by mouth daily. 90 tablet 3   No current facility-administered medications on file prior to visit.    BP 126/90   Temp 98.4 F (36.9 C)   Wt 198 lb (89.8 kg)   LMP 12/10/2014   BMI 33.99 kg/m       Objective:   Physical Exam Vitals and nursing note reviewed.  Constitutional:      Appearance: Normal appearance.  HENT:     Right Ear: Hearing, ear canal and external ear normal. A middle ear effusion is present. Tympanic membrane is bulging. Tympanic membrane is not perforated or erythematous.     Left Ear: Hearing, tympanic membrane, ear canal and external ear normal.     Nose: Nose normal. No congestion or rhinorrhea.     Right Turbinates: Swollen.     Left Turbinates: Swollen.     Mouth/Throat:     Mouth: Mucous membranes are moist.     Pharynx: Oropharynx is clear. No oropharyngeal exudate or posterior oropharyngeal erythema.  Cardiovascular:     Rate and Rhythm: Normal rate and regular rhythm.     Pulses: Normal pulses.     Heart sounds: Normal heart sounds.  Pulmonary:     Effort: Pulmonary effort is normal.     Breath sounds: Normal breath sounds.  Skin:    General: Skin is warm and dry.  Neurological:     General: No focal deficit present.     Mental Status: She is alert and oriented to person, place, and time.  Psychiatric:        Mood and Affect: Mood normal.        Behavior: Behavior normal.        Thought Content: Thought content normal.       Assessment & Plan:  1. Non-recurrent acute allergic otitis media of right ear -No signs of bacterial infection.  Advised Afrin twice daily x3 days.  Add Claritin.  Follow-up if no improvement over the next week  2. Seasonal allergies - OTC claritin routinely    Shirline Frees, NP

## 2020-11-22 ENCOUNTER — Other Ambulatory Visit: Payer: Self-pay

## 2020-11-22 ENCOUNTER — Telehealth (INDEPENDENT_AMBULATORY_CARE_PROVIDER_SITE_OTHER): Payer: Medicare Other | Admitting: Family Medicine

## 2020-11-22 DIAGNOSIS — R8290 Unspecified abnormal findings in urine: Secondary | ICD-10-CM | POA: Diagnosis not present

## 2020-11-22 DIAGNOSIS — R300 Dysuria: Secondary | ICD-10-CM

## 2020-11-22 LAB — POC URINALSYSI DIPSTICK (AUTOMATED)
Bilirubin, UA: NEGATIVE
Blood, UA: NEGATIVE
Glucose, UA: NEGATIVE
Ketones, UA: 5
Leukocytes, UA: NEGATIVE
Nitrite, UA: NEGATIVE
Protein, UA: POSITIVE — AB
Spec Grav, UA: 1.025 (ref 1.010–1.025)
Urobilinogen, UA: 0.2 E.U./dL
pH, UA: 6 (ref 5.0–8.0)

## 2020-11-22 MED ORDER — NITROFURANTOIN MONOHYD MACRO 100 MG PO CAPS: 100.0000 mg | ORAL_CAPSULE | Freq: Two times a day (BID) | ORAL | 0 refills | Status: DC

## 2020-11-22 NOTE — Progress Notes (Signed)
Virtual Visit via Video Note  I connected with Mikayla Sweeney  on 11/22/20 at  5:40 PM EST by a video enabled telemedicine application and verified that I am speaking with the correct person using two identifiers.  Location patient: home, [place] Location provider:work or home office Persons participating in the virtual visit: patient, provider  I discussed the limitations of evaluation and management by telemedicine and the availability of in person appointments. The patient expressed understanding and agreed to proceed.   HPI:  Acute telemedicine visit for "UTI": -Onset: 1-2 weeks ago -Symptoms include: dysuria - some bladder discomfort right after urination, frequency, urgency, a little nauseous sometimes -she did a urine dip at PCP office today -Denies: fevers, malaise, vomiting, vaginal symptoms, flank pain -Has tried: nothing, but reports has had UTI in the past and Macrobid always resolved it - is requesting Macrobid rx -Pertinent past medical history: Dysuria, UTI, Sarcoid, HTN -Pertinent medication allergies: see chart, nkda -COVID-19 vaccine status: ROS: See pertinent positives and negatives per HPI.  Past Medical History:  Diagnosis Date  . Anxiety   . Asthma   . B12 deficiency   . Bipolar 1 disorder (HCC)   . CHF (congestive heart failure) (HCC)   . Epilepsy (HCC)   . Hypertension   . Sarcoidosis     Past Surgical History:  Procedure Laterality Date  . TONSILLECTOMY    . TUBAL LIGATION       Current Outpatient Medications:  .  albuterol (PROVENTIL HFA;VENTOLIN HFA) 108 (90 Base) MCG/ACT inhaler, Inhale 1-2 puffs into the lungs every 6 (six) hours as needed. For wheeze or shortness of breath, Disp: 1 Inhaler, Rfl: 0 .  amLODipine (NORVASC) 10 MG tablet, Take 1 tablet (10 mg total) by mouth daily., Disp: 90 tablet, Rfl: 3 .  carvedilol (COREG) 25 MG tablet, Take 1 tablet (25 mg total) by mouth 2 (two) times daily with a meal., Disp: 180 tablet, Rfl: 1 .  fluticasone  (FLONASE) 50 MCG/ACT nasal spray, Place 2 sprays into both nostrils daily., Disp: 1 g, Rfl: 0 .  furosemide (LASIX) 20 MG tablet, Take 1 tablet (20 mg total) by mouth daily., Disp: 90 tablet, Rfl: 3 .  hydrochlorothiazide (HYDRODIURIL) 25 MG tablet, Take 1 tablet (25 mg total) by mouth daily., Disp: 90 tablet, Rfl: 3 .  ibuprofen (ADVIL,MOTRIN) 600 MG tablet, Take 1 tablet (600 mg total) by mouth every 8 (eight) hours as needed., Disp: 30 tablet, Rfl: 0 .  losartan (COZAAR) 100 MG tablet, Take 1 tablet (100 mg total) by mouth daily., Disp: 90 tablet, Rfl: 3 .  omeprazole (PRILOSEC) 20 MG capsule, Take 1 capsule (20 mg total) by mouth daily., Disp: 90 capsule, Rfl: 3 .  oxybutynin (DITROPAN-XL) 5 MG 24 hr tablet, TAKE 1 TABLET BY MOUTH EVERYDAY AT BEDTIME, Disp: 90 tablet, Rfl: 1 .  rosuvastatin (CRESTOR) 10 MG tablet, Take 1 tablet (10 mg total) by mouth daily., Disp: 90 tablet, Rfl: 3  EXAM:  VITALS per patient if applicable:  GENERAL: alert, oriented, appears well and in no acute distress  HEENT: atraumatic, conjunttiva clear, no obvious abnormalities on inspection of external nose and ears  NECK: normal movements of the head and neck  LUNGS: on inspection no signs of respiratory distress, breathing rate appears normal, no obvious gross SOB, gasping or wheezing  CV: no obvious cyanosis  MS: moves all visible extremities without noticeable abnormality  PSYCH/NEURO: pleasant and cooperative, no obvious depression or anxiety, speech and thought processing grossly intact  ASSESSMENT AND PLAN:  Discussed the following assessment and plan:  Dysuria - Plan: POCT Urinalysis Dipstick (Automated), CANCELED: POCT Urinalysis Dipstick (Automated)  -we discussed possible serious and likely etiologies, options for evaluation and workup, limitations of telemedicine visit vs in person visit, treatment, treatment risks and precautions. Pt prefers to treat via telemedicine empirically rather than in  person at this moment.  Reviewed urine dip from PCP office, did have protein positive, but otherwise negative.  She feels certain this is a urinary tract infection, as has had these in the past.  She prefers to try empiric treatment with Macrobid 100 mg twice daily for 7 days.  Given the protein in the urine, I did recommend that she follow-up with her primary care provider in person in 2 weeks, sooner if symptoms worsen or do not resolve with treatment.  She is agreeable to this plan.  Sent message to PCP office schedulers to assist.  Scheduled follow up with PCP offered: Sent message to schedulers to assist and advised patient to contact PCP office to schedule if does not receive call back in next 24 hours. Advised to seek prompt in person care if worsening, new symptoms arise, or if is not improving with treatment. Discussed options for inperson care if PCP office not available. Did let this patient know that I only do telemedicine on Tuesdays and Thursdays for [place]. Advised to schedule follow up visit with PCP or UCC if any further questions or concerns to avoid delays in care.   I discussed the assessment and treatment plan with the patient. The patient was provided an opportunity to ask questions and all were answered. The patient agreed with the plan and demonstrated an understanding of the instructions.     Terressa Koyanagi, DO

## 2020-11-22 NOTE — Patient Instructions (Signed)
-  I sent the medication(s) we discussed to your pharmacy: Meds ordered this encounter  Medications  . nitrofurantoin, macrocrystal-monohydrate, (MACROBID) 100 MG capsule    Sig: Take 1 capsule (100 mg total) by mouth 2 (two) times daily.    Dispense:  14 capsule    Refill:  0   I recommend a follow-up with your primary care doctor in 2 weeks, to follow-up on the protein in the urine, and to make sure you are feeling better.  Please call your doctor office to schedule an in person visit.  Follow-up sooner if your symptoms worsen or do not respond to the antibiotic.  I hope you are feeling better soon!  Seek in person care promptly if your symptoms worsen, new concerns arise or you are not improving with treatment.  It was nice to meet you today. I help [facility] out with telemedicine visits on Tuesdays and Thursdays and am available for visits on those days. If you have any concerns or questions following this visit please schedule a follow up visit with your Primary Care doctor or seek care at a local urgent care clinic to avoid delays in care.

## 2020-12-14 ENCOUNTER — Other Ambulatory Visit: Payer: Self-pay | Admitting: Adult Health

## 2020-12-14 DIAGNOSIS — R350 Frequency of micturition: Secondary | ICD-10-CM

## 2020-12-14 DIAGNOSIS — I10 Essential (primary) hypertension: Secondary | ICD-10-CM

## 2020-12-14 DIAGNOSIS — I5031 Acute diastolic (congestive) heart failure: Secondary | ICD-10-CM

## 2020-12-14 NOTE — Telephone Encounter (Signed)
Sent to the pharmacy by e-scribe. 

## 2020-12-20 ENCOUNTER — Telehealth: Payer: Self-pay | Admitting: Adult Health

## 2020-12-20 NOTE — Telephone Encounter (Signed)
Left message for patient to call back and schedule Medicare Annual Wellness Visit (AWV) either virtually or in office.   Last AWV no information please schedule at anytime with [facility] Nurse Health Advisor 1 or 2   This should be a 45 minute visit. 

## 2020-12-21 NOTE — Telephone Encounter (Signed)
Pt called back inquiring if she would receive a gift card for the visit and she stated that she will give her insurance company a call and then give [place] a call back.

## 2020-12-30 ENCOUNTER — Telehealth: Payer: Self-pay | Admitting: Adult Health

## 2020-12-30 ENCOUNTER — Other Ambulatory Visit: Payer: Self-pay | Admitting: Family Medicine

## 2020-12-30 DIAGNOSIS — R300 Dysuria: Secondary | ICD-10-CM | POA: Diagnosis not present

## 2020-12-30 NOTE — Telephone Encounter (Signed)
Spoke to the pt.  She will seek care at an UC this weekend.  Nothing further needed.

## 2020-12-30 NOTE — Telephone Encounter (Signed)
Due to history I will need to see her first and run a UA and Culture before I can send in an antibiotic

## 2020-12-30 NOTE — Telephone Encounter (Signed)
Patient is calling and stated that she is experiencing abdominal and frequent urination and wanted to see if provider can send in a antibiotic to the pharmacy. Pt declined appointment, please advise. CB is [phone]

## 2021-02-01 ENCOUNTER — Telehealth: Payer: Self-pay | Admitting: Adult Health

## 2021-02-01 ENCOUNTER — Other Ambulatory Visit: Payer: Self-pay | Admitting: Adult Health

## 2021-02-01 DIAGNOSIS — Z1231 Encounter for screening mammogram for malignant neoplasm of breast: Secondary | ICD-10-CM

## 2021-02-01 NOTE — Telephone Encounter (Signed)
Left a message for a return call.  Pt needs to be notified that gynecology does not require referral.  She can contact her insurance company to find out who is in network and then call to make an appointment.

## 2021-02-01 NOTE — Telephone Encounter (Signed)
Patient is calling and wanted to see if she can get a referral to see a [facility], please advise. CB is [phone]

## 2021-02-10 NOTE — Telephone Encounter (Signed)
Lvm today with same message, no referral needed

## 2021-02-11 DIAGNOSIS — N390 Urinary tract infection, site not specified: Secondary | ICD-10-CM | POA: Diagnosis not present

## 2021-02-20 ENCOUNTER — Telehealth: Payer: Self-pay | Admitting: Adult Health

## 2021-02-20 NOTE — Telephone Encounter (Signed)
Left message for patient to call back and schedule Medicare Annual Wellness Visit (AWV) either virtually or in office. No detailed message left   Last AWV no information  please schedule at anytime with [facility] Nurse Health Advisor 1 or 2   This should be a 45 minute visit. 

## 2021-02-24 ENCOUNTER — Other Ambulatory Visit: Payer: Self-pay

## 2021-02-24 ENCOUNTER — Ambulatory Visit (HOSPITAL_COMMUNITY)
Admission: EM | Admit: 2021-02-24 | Discharge: 2021-02-24 | Disposition: A | Discharge status: Home / Self Care | Payer: Medicare Other | Attending: Urgent Care | Admitting: Urgent Care

## 2021-02-24 ENCOUNTER — Encounter (HOSPITAL_COMMUNITY): Payer: Self-pay

## 2021-02-24 DIAGNOSIS — M545 Low back pain, unspecified: Secondary | ICD-10-CM

## 2021-02-24 DIAGNOSIS — R350 Frequency of micturition: Secondary | ICD-10-CM

## 2021-02-24 DIAGNOSIS — R3915 Urgency of urination: Secondary | ICD-10-CM

## 2021-02-24 LAB — POCT URINALYSIS DIPSTICK, ED / UC
Glucose, UA: NEGATIVE mg/dL
Hgb urine dipstick: NEGATIVE
Ketones, ur: 15 mg/dL — AB
Leukocytes,Ua: NEGATIVE
Nitrite: NEGATIVE
Protein, ur: 30 mg/dL — AB
Specific Gravity, Urine: 1.025 (ref 1.005–1.030)
Urobilinogen, UA: 0.2 mg/dL (ref 0.0–1.0)
pH: 6 (ref 5.0–8.0)

## 2021-02-24 MED ORDER — TIZANIDINE HCL 4 MG PO TABS: 4.0000 mg | ORAL_TABLET | Freq: Three times a day (TID) | ORAL | 0 refills | Status: DC | PRN

## 2021-02-24 MED ORDER — PHENAZOPYRIDINE HCL 200 MG PO TABS: 200.0000 mg | ORAL_TABLET | Freq: Three times a day (TID) | ORAL | 0 refills | Status: DC | PRN

## 2021-02-24 MED ORDER — ONDANSETRON 8 MG PO TBDP: 8.0000 mg | ORAL_TABLET | Freq: Three times a day (TID) | ORAL | 0 refills | Status: DC | PRN

## 2021-02-24 NOTE — ED Triage Notes (Signed)
Pt in with c/o urinary frequency and lower back pain that has been going on for the past week

## 2021-02-24 NOTE — ED Provider Notes (Signed)
[facility]   MRN: [id number] DOB: Jan 30, 1961  Subjective:   Mikayla Sweeney is a 60 y.o. female presenting for 5 day history of persistent urinary frequency, urgency, intermittent low back pains, nausea without vomiting. Has had trouble with urinary symptoms, not always an UTI. Last episode was this past summer, had resistance to Macrobid. Hydrates inconsistently, does drink some soda, tea but not necessarily daily. Denies fever, vomiting, hematuria, falls, trauma, weakness. Has not taken medications for relief. Last a1c was 5.7% 06/17/2020.  No current facility-administered medications for this encounter.  Current Outpatient Medications:  .  albuterol (PROVENTIL HFA;VENTOLIN HFA) 108 (90 Base) MCG/ACT inhaler, Inhale 1-2 puffs into the lungs every 6 (six) hours as needed. For wheeze or shortness of breath, Disp: 1 Inhaler, Rfl: 0 .  amLODipine (NORVASC) 10 MG tablet, Take 1 tablet (10 mg total) by mouth daily., Disp: 90 tablet, Rfl: 3 .  carvedilol (COREG) 25 MG tablet, TAKE 1 TABLET BY MOUTH TWICE A DAY WITH A MEAL, Disp: 180 tablet, Rfl: 1 .  fluticasone (FLONASE) 50 MCG/ACT nasal spray, Place 2 sprays into both nostrils daily., Disp: 1 g, Rfl: 0 .  furosemide (LASIX) 20 MG tablet, Take 1 tablet (20 mg total) by mouth daily., Disp: 90 tablet, Rfl: 3 .  hydrochlorothiazide (HYDRODIURIL) 25 MG tablet, Take 1 tablet (25 mg total) by mouth daily., Disp: 90 tablet, Rfl: 3 .  ibuprofen (ADVIL,MOTRIN) 600 MG tablet, Take 1 tablet (600 mg total) by mouth every 8 (eight) hours as needed., Disp: 30 tablet, Rfl: 0 .  losartan (COZAAR) 100 MG tablet, Take 1 tablet (100 mg total) by mouth daily., Disp: 90 tablet, Rfl: 3 .  nitrofurantoin, macrocrystal-monohydrate, (MACROBID) 100 MG capsule, Take 1 capsule (100 mg total) by mouth 2 (two) times daily., Disp: 14 capsule, Rfl: 0 .  omeprazole (PRILOSEC) 20 MG capsule, Take 1 capsule (20 mg total) by mouth daily., Disp: 90 capsule, Rfl: 3 .   oxybutynin (DITROPAN-XL) 5 MG 24 hr tablet, TAKE 1 TABLET BY MOUTH EVERYDAY AT BEDTIME, Disp: 90 tablet, Rfl: 1 .  rosuvastatin (CRESTOR) 10 MG tablet, Take 1 tablet (10 mg total) by mouth daily., Disp: 90 tablet, Rfl: 3   No Known Allergies  Past Medical History:  Diagnosis Date  . Anxiety   . Asthma   . B12 deficiency   . Bipolar 1 disorder (HCC)   . CHF (congestive heart failure) (HCC)   . Epilepsy (HCC)   . Hypertension   . Sarcoidosis      Past Surgical History:  Procedure Laterality Date  . TONSILLECTOMY    . TUBAL LIGATION      Family History  Problem Relation Age of Onset  . Pancreatic cancer Mother   . Hypertension Mother   . Diabetes Mother   . Heart disease Father   . Gout Father   . Cirrhosis Father   . Heart disease Sister        CHF    Social History   Tobacco Use  . Smoking status: Former Games developer  . Smokeless tobacco: Never Used  Vaping Use  . Vaping Use: Never used  Substance Use Topics  . Alcohol use: No  . Drug use: No    ROS   Objective:   Vitals: BP (!) 144/88   Pulse 63   Temp 98.2 F (36.8 C)   Resp 18   LMP 12/10/2014   SpO2 98%   Physical Exam Constitutional:  General: She is not in acute distress.    Appearance: Normal appearance. She is well-developed. She is obese. She is not ill-appearing, toxic-appearing or diaphoretic.  HENT:     Head: Normocephalic and atraumatic.     Nose: Nose normal.     Mouth/Throat:     Mouth: Mucous membranes are moist.     Pharynx: Oropharynx is clear.  Eyes:     General: No scleral icterus.       Right eye: No discharge.        Left eye: No discharge.     Extraocular Movements: Extraocular movements intact.     Conjunctiva/sclera: Conjunctivae normal.     Pupils: Pupils are equal, round, and reactive to light.  Cardiovascular:     Rate and Rhythm: Normal rate.  Pulmonary:     Effort: Pulmonary effort is normal.  Abdominal:     Tenderness: There is no right CVA tenderness or  left CVA tenderness.  Musculoskeletal:     Lumbar back: Tenderness (over area outlined) present. No swelling, edema, deformity, signs of trauma, lacerations, spasms or bony tenderness. Normal range of motion. Negative right straight leg raise test and negative left straight leg raise test. No scoliosis.       Back:  Skin:    General: Skin is warm and dry.  Neurological:     General: No focal deficit present.     Mental Status: She is alert and oriented to person, place, and time.     Motor: No weakness.     Coordination: Coordination normal.     Gait: Gait normal.     Deep Tendon Reflexes: Reflexes normal.     Comments: Strength 5/5 for lower extremities. Ambulates without assistance at an expected pace.   Psychiatric:        Mood and Affect: Mood normal.        Behavior: Behavior normal.        Thought Content: Thought content normal.        Judgment: Judgment normal.     Results for orders placed or performed during the hospital encounter of 02/24/21 (from the past 24 hour(s))  POC Urinalysis dipstick     Status: Abnormal   Collection Time: 02/24/21  6:56 PM  Result Value Ref Range   Glucose, UA NEGATIVE NEGATIVE mg/dL   Bilirubin Urine SMALL (A) NEGATIVE   Ketones, ur 15 (A) NEGATIVE mg/dL   Specific Gravity, Urine 1.025 1.005 - 1.030   Hgb urine dipstick NEGATIVE NEGATIVE   pH 6.0 5.0 - 8.0   Protein, ur 30 (A) NEGATIVE mg/dL   Urobilinogen, UA 0.2 0.0 - 1.0 mg/dL   Nitrite NEGATIVE NEGATIVE   Leukocytes,Ua NEGATIVE NEGATIVE    Assessment and Plan :   PDMP not reviewed this encounter.  1. Urinary frequency   2. Acute bilateral low back pain without sciatica   3. Urinary urgency     Recommended hydrating more consistently, limiting urinary irritants. Use supportive care otherwise including Zofran, Pyridium. Counseled on back care. Use APAP, tizanidine. Imaging deferred given excellent PE findings and lack of trauma.  Counseled patient on potential for adverse  effects with medications prescribed/recommended today, ER and return-to-clinic precautions discussed, patient verbalized understanding.    Wallis Bamberg, PA-C 02/24/21 1920

## 2021-02-24 NOTE — Discharge Instructions (Signed)
Make sure you hydrate very well with plain water and a quantity of 64 ounces of water a day.  Please limit drinks that are considered urinary irritants such as soda, sweet tea, coffee, energy drinks, alcohol.  These can worsen your urinary and genital symptoms but also be the source of them.  I will let you know about your urine culture results through MyChart to see if we need to prescribe or change your antibiotics based off of those results. Contact [facility] for a consultation regarding frequent urinary symptoms.   Do not use any nonsteroidal anti-inflammatories (NSAIDs) like ibuprofen, Motrin, naproxen, Aleve, etc. which are all available over-the-counter.  Please just use Tylenol at a dose of 500mg -650mg  once every 6 hours as needed for your aches, pains, fevers.

## 2021-02-26 LAB — URINE CULTURE

## 2021-02-28 ENCOUNTER — Telehealth: Payer: Self-pay | Admitting: Adult Health

## 2021-02-28 NOTE — Telephone Encounter (Signed)
Tried calling patient to see if patient could r/s her 4/4 appointment No answer  Mikayla Sweeney new  Needs light schedule

## 2021-03-13 ENCOUNTER — Ambulatory Visit: Payer: Medicare Other

## 2021-03-20 ENCOUNTER — Telehealth: Payer: Self-pay | Admitting: Adult Health

## 2021-03-20 NOTE — Telephone Encounter (Signed)
If patient returned my call  Please disregard.  I was able to reschedule another patient.  Just confirm appointment with patient 

## 2021-03-23 ENCOUNTER — Ambulatory Visit: Payer: Medicare Other

## 2021-03-27 NOTE — Progress Notes (Unsigned)
Subjective:   Mikayla Sweeney is a 60 y.o. female who presents for Medicare Annual (Subsequent) preventive examination.   Virtual Visit via Video Note  I connected with Mikayla Sweeney by a video enabled telemedicine application and verified that I am speaking with the correct person using two identifiers.  Location: Patient: Home Provider: Office Persons participating in the virtual visit: patient, provider   I discussed the limitations of evaluation and management by telemedicine and the availability of in person appointments. The patient expressed understanding and agreed to proceed.     Mikayla Sweeney ,LPN   Review of Systems    ***       Objective:    There were no vitals filed for this visit. There is no height or weight on file to calculate BMI.  Advanced Directives 04/02/2017 12/24/2014 10/10/2014 07/26/2014  Does Patient Have a Medical Advance Directive? No No No No  Would patient like information on creating a medical advance directive? - - No - patient declined information Yes - Educational materials given    Current Medications (verified) Outpatient Encounter Medications as of 03/28/2021  Medication Sig  . albuterol (PROVENTIL HFA;VENTOLIN HFA) 108 (90 Base) MCG/ACT inhaler Inhale 1-2 puffs into the lungs every 6 (six) hours as needed. For wheeze or shortness of breath  . amLODipine (NORVASC) 10 MG tablet Take 1 tablet (10 mg total) by mouth daily.  . carvedilol (COREG) 25 MG tablet TAKE 1 TABLET BY MOUTH TWICE A DAY WITH A MEAL  . fluticasone (FLONASE) 50 MCG/ACT nasal spray Place 2 sprays into both nostrils daily.  . furosemide (LASIX) 20 MG tablet Take 1 tablet (20 mg total) by mouth daily.  . hydrochlorothiazide (HYDRODIURIL) 25 MG tablet Take 1 tablet (25 mg total) by mouth daily.  Marland Kitchen ibuprofen (ADVIL,MOTRIN) 600 MG tablet Take 1 tablet (600 mg total) by mouth every 8 (eight) hours as needed.  Marland Kitchen losartan (COZAAR) 100 MG tablet Take 1 tablet (100 mg total) by mouth  daily.  . nitrofurantoin, macrocrystal-monohydrate, (MACROBID) 100 MG capsule Take 1 capsule (100 mg total) by mouth 2 (two) times daily.  Marland Kitchen omeprazole (PRILOSEC) 20 MG capsule Take 1 capsule (20 mg total) by mouth daily.  . ondansetron (ZOFRAN-ODT) 8 MG disintegrating tablet Take 1 tablet (8 mg total) by mouth every 8 (eight) hours as needed for nausea or vomiting.  Marland Kitchen oxybutynin (DITROPAN-XL) 5 MG 24 hr tablet TAKE 1 TABLET BY MOUTH EVERYDAY AT BEDTIME  . phenazopyridine (PYRIDIUM) 200 MG tablet Take 1 tablet (200 mg total) by mouth 3 (three) times daily as needed for pain.  . rosuvastatin (CRESTOR) 10 MG tablet Take 1 tablet (10 mg total) by mouth daily.  Marland Kitchen tiZANidine (ZANAFLEX) 4 MG tablet Take 1 tablet (4 mg total) by mouth every 8 (eight) hours as needed for muscle spasms.   No facility-administered encounter medications on file as of 03/28/2021.    Allergies (verified) Patient has no known allergies.   History: Past Medical History:  Diagnosis Date  . Anxiety   . Asthma   . B12 deficiency   . Bipolar 1 disorder (HCC)   . CHF (congestive heart failure) (HCC)   . Epilepsy (HCC)   . Hypertension   . Sarcoidosis    Past Surgical History:  Procedure Laterality Date  . TONSILLECTOMY    . TUBAL LIGATION     Family History  Problem Relation Age of Onset  . Pancreatic cancer Mother   . Hypertension Mother   . Diabetes  Mother   . Heart disease Father   . Gout Father   . Cirrhosis Father   . Heart disease Sister        CHF   Social History   Socioeconomic History  . Marital status: Married    Spouse name: Not on file  . Number of children: Not on file  . Years of education: Not on file  . Highest education level: Not on file  Occupational History  . Not on file  Tobacco Use  . Smoking status: Former Games developer  . Smokeless tobacco: Never Used  Vaping Use  . Vaping Use: Never used  Substance and Sexual Activity  . Alcohol use: No  . Drug use: No  . Sexual activity:  Not on file  Other Topics Concern  . Not on file  Social History Narrative   She has her own business    Married    Three children    5 grandchildren       She likes to spend time with her grandchildren    Social Determinants of Corporate investment banker Strain: Not on file  Food Insecurity: Not on file  Transportation Needs: Not on file  Physical Activity: Not on file  Stress: Not on file  Social Connections: Not on file    Tobacco Counseling Counseling given: Not Answered   Clinical Intake:                 Diabetic?***         Activities of Daily Living No flowsheet data found.  Patient Care Team: Shirline Frees, NP as PCP - General (Family Medicine)  Indicate any recent Medical Services you may have received from other than [facility] providers in the past year (date may be approximate).     Assessment:   This is a routine wellness examination for Mikayla Sweeney.  Hearing/Vision screen No exam data present  Dietary issues and exercise activities discussed:    Goals   None    Depression Screen PHQ 2/9 Scores 06/17/2020  PHQ - 2 Score 0    Fall Risk Fall Risk  06/17/2020  Falls in the past year? 0    FALL RISK PREVENTION PERTAINING TO THE HOME:  Any stairs in or around the home? {YES/NO:21197} If so, are there any without handrails? {YES/NO:21197} Home free of loose throw rugs in walkways, pet beds, electrical cords, etc? {YES/NO:21197} Adequate lighting in your home to reduce risk of falls? {YES/NO:21197}  ASSISTIVE DEVICES UTILIZED TO PREVENT FALLS:  Life alert? {YES/NO:21197} Use of a cane, walker or w/c? {YES/NO:21197} Grab bars in the bathroom? {YES/NO:21197} Shower chair or bench in shower? {YES/NO:21197} Elevated toilet seat or a handicapped toilet? {YES/NO:21197}  TIMED UP AND GO:  Was the test performed? {YES/NO:21197}.  Length of time to ambulate 10 feet: *** sec.   {Appearance of BJSE:8315176}  Cognitive Function:         Immunizations Immunization History  Administered Date(s) Administered  . Influenza,inj,Quad PF,6+ Mos 10/16/2017, 12/24/2018  . PFIZER(Purple Top)SARS-COV-2 Vaccination 03/24/2020, 04/18/2020  . Tdap 02/05/2013    {TDAP status:2101805}  {Flu Vaccine status:2101806}  {Pneumococcal vaccine status:2101807}  {Covid-19 vaccine status:2101808}  Qualifies for Shingles Vaccine? {YES/NO:21197}  Zostavax completed {YES/NO:21197}  {Shingrix Completed?:2101804}  Screening Tests Health Maintenance  Topic Date Due  . MAMMOGRAM  Never done  . PAP SMEAR-Modifier  12/19/2014  . COVID-19 Vaccine (3 - Booster for Pfizer series) 10/19/2020  . INFLUENZA VACCINE  07/10/2021  . COLONOSCOPY (Pts 45-57yrs Insurance  coverage will need to be confirmed)  05/10/2022  . TETANUS/TDAP  02/05/2023  . Hepatitis C Screening  Completed  . HIV Screening  Completed  . HPV VACCINES  Aged Out    Health Maintenance  Health Maintenance Due  Topic Date Due  . MAMMOGRAM  Never done  . PAP SMEAR-Modifier  12/19/2014  . COVID-19 Vaccine (3 - Booster for Pfizer series) 10/19/2020    {Colorectal cancer screening:2101809}  {Mammogram status:21018020}  {Bone Density status:21018021}  Lung Cancer Screening: (Low Dose CT Chest recommended if Age 79-80 years, 30 pack-year currently smoking OR have quit w/in 15years.) {DOES NOT does:27190::"does not"} qualify.   Lung Cancer Screening Referral: ***  Additional Screening:  Hepatitis C Screening: {DOES NOT does:27190::"does not"} qualify; Completed ***  Vision Screening: Recommended annual ophthalmology exams for early detection of glaucoma and other disorders of the eye. Is the patient up to date with their annual eye exam?  {YES/NO:21197} Who is the provider or what is the name of the office in which the patient attends annual eye exams? *** If pt is not established with a provider, would they like to be referred to a provider to establish care?  {YES/NO:21197}.   Dental Screening: Recommended annual dental exams for proper oral hygiene  Community Resource Referral / Chronic Care Management: CRR required this visit?  {YES/NO:21197}  CCM required this visit?  {YES/NO:21197}     Plan:     I have personally reviewed and noted the following in the patient's chart:   . Medical and social history . Use of alcohol, tobacco or illicit drugs  . Current medications and supplements . Functional ability and status . Nutritional status . Physical activity . Advanced directives . List of other physicians . Hospitalizations, surgeries, and ER visits in previous 12 months . Vitals . Screenings to include cognitive, depression, and falls . Referrals and appointments  In addition, I have reviewed and discussed with patient certain preventive protocols, quality metrics, and best practice recommendations. A written personalized care plan for preventive services as well as general preventive health recommendations were provided to patient.     Mikayla Rummage, LPN   2/42/6834   Nurse Notes: ***

## 2021-03-28 ENCOUNTER — Telehealth: Payer: Self-pay

## 2021-03-28 ENCOUNTER — Ambulatory Visit: Payer: Medicare Other

## 2021-03-28 DIAGNOSIS — Z0000 Encounter for general adult medical examination without abnormal findings: Secondary | ICD-10-CM

## 2021-03-28 NOTE — Telephone Encounter (Cosign Needed)
Pt was scheduled 0900 VV AWV , pt never connected to caregility.  Attempted to call pt via telephone to complete the visit 0902,  0909, 0915  With no answer, phone goes straight to voice mail unable to leave a message.

## 2021-04-13 ENCOUNTER — Other Ambulatory Visit: Payer: Self-pay | Admitting: Adult Health

## 2021-04-13 DIAGNOSIS — I10 Essential (primary) hypertension: Secondary | ICD-10-CM

## 2021-05-19 ENCOUNTER — Ambulatory Visit: Payer: Medicare Other

## 2021-05-19 ENCOUNTER — Telehealth: Payer: Self-pay

## 2021-05-19 NOTE — Progress Notes (Deleted)
Subjective:   Mikayla Sweeney is a 60 y.o. female who presents for an Initial Medicare Annual Wellness Visit.  Virtual Visit via Video Note  I connected with Mikayla Sweeney by a video enabled telemedicine application and verified that I am speaking with the correct person using two identifiers.  Location: Patient: Home Provider: Office Persons participating in the virtual visit: patient, provider   I discussed the limitations of evaluation and management by telemedicine and the availability of in person appointments. The patient expressed understanding and agreed to proceed.     Vernona Rieger Tyrie Porzio,LPN   Review of Systems    N/a       Objective:    There were no vitals filed for this visit. There is no height or weight on file to calculate BMI.  Advanced Directives 04/02/2017 12/24/2014 10/10/2014 07/26/2014  Does Patient Have a Medical Advance Directive? No No No No  Would patient like information on creating a medical advance directive? - - No - patient declined information Yes - Educational materials given    Current Medications (verified) Outpatient Encounter Medications as of 05/19/2021  Medication Sig   albuterol (PROVENTIL HFA;VENTOLIN HFA) 108 (90 Base) MCG/ACT inhaler Inhale 1-2 puffs into the lungs every 6 (six) hours as needed. For wheeze or shortness of breath   amLODipine (NORVASC) 10 MG tablet TAKE 1 TABLET BY MOUTH EVERY DAY   carvedilol (COREG) 25 MG tablet TAKE 1 TABLET BY MOUTH TWICE A DAY WITH A MEAL   fluticasone (FLONASE) 50 MCG/ACT nasal spray Place 2 sprays into both nostrils daily.   furosemide (LASIX) 20 MG tablet Take 1 tablet (20 mg total) by mouth daily.   hydrochlorothiazide (HYDRODIURIL) 25 MG tablet Take 1 tablet (25 mg total) by mouth daily.   ibuprofen (ADVIL,MOTRIN) 600 MG tablet Take 1 tablet (600 mg total) by mouth every 8 (eight) hours as needed.   losartan (COZAAR) 100 MG tablet Take 1 tablet (100 mg total) by mouth daily.   nitrofurantoin,  macrocrystal-monohydrate, (MACROBID) 100 MG capsule Take 1 capsule (100 mg total) by mouth 2 (two) times daily.   omeprazole (PRILOSEC) 20 MG capsule Take 1 capsule (20 mg total) by mouth daily.   ondansetron (ZOFRAN-ODT) 8 MG disintegrating tablet Take 1 tablet (8 mg total) by mouth every 8 (eight) hours as needed for nausea or vomiting.   oxybutynin (DITROPAN-XL) 5 MG 24 hr tablet TAKE 1 TABLET BY MOUTH EVERYDAY AT BEDTIME   phenazopyridine (PYRIDIUM) 200 MG tablet Take 1 tablet (200 mg total) by mouth 3 (three) times daily as needed for pain.   rosuvastatin (CRESTOR) 10 MG tablet Take 1 tablet (10 mg total) by mouth daily.   tiZANidine (ZANAFLEX) 4 MG tablet Take 1 tablet (4 mg total) by mouth every 8 (eight) hours as needed for muscle spasms.   No facility-administered encounter medications on file as of 05/19/2021.    Allergies (verified) Patient has no known allergies.   History: Past Medical History:  Diagnosis Date   Anxiety    Asthma    B12 deficiency    Bipolar 1 disorder (HCC)    CHF (congestive heart failure) (HCC)    Epilepsy (HCC)    Hypertension    Sarcoidosis    Past Surgical History:  Procedure Laterality Date   TONSILLECTOMY     TUBAL LIGATION     Family History  Problem Relation Age of Onset   Pancreatic cancer Mother    Hypertension Mother    Diabetes Mother  Heart disease Father    Gout Father    Cirrhosis Father    Heart disease Sister        CHF   Social History   Socioeconomic History   Marital status: Married    Spouse name: Not on file   Number of children: Not on file   Years of education: Not on file   Highest education level: Not on file  Occupational History   Not on file  Tobacco Use   Smoking status: Former    Pack years: 0.00   Smokeless tobacco: Never  Vaping Use   Vaping Use: Never used  Substance and Sexual Activity   Alcohol use: No   Drug use: No   Sexual activity: Not on file  Other Topics Concern   Not on file   Social History Narrative   She has her own business    Married    Three children    5 grandchildren       She likes to spend time with her grandchildren    Social Determinants of Corporate investment banker Strain: Not on file  Food Insecurity: Not on file  Transportation Needs: Not on file  Physical Activity: Not on file  Stress: Not on file  Social Connections: Not on file    Tobacco Counseling Counseling given: Not Answered   Clinical Intake:                 Diabetic?no         Activities of Daily Living No flowsheet data found.  Patient Care Team: Shirline Frees, NP as PCP - General (Family Medicine)  Indicate any recent Medical Services you may have received from other than [facility] providers in the past year (date may be approximate).     Assessment:   This is a routine wellness examination for Mikayla Sweeney.  Hearing/Vision screen No results found.  Dietary issues and exercise activities discussed:     Goals Addressed   None    Depression Screen PHQ 2/9 Scores 06/17/2020  PHQ - 2 Score 0    Fall Risk Fall Risk  06/17/2020  Falls in the past year? 0    FALL RISK PREVENTION PERTAINING TO THE HOME:  Any stairs in or around the home? {YES/NO:21197} If so, are there any without handrails? {YES/NO:21197} Home free of loose throw rugs in walkways, pet beds, electrical cords, etc? {YES/NO:21197} Adequate lighting in your home to reduce risk of falls? {YES/NO:21197}  ASSISTIVE DEVICES UTILIZED TO PREVENT FALLS:  Life alert? {YES/NO:21197} Use of a cane, walker or w/c? {YES/NO:21197} Grab bars in the bathroom? {YES/NO:21197} Shower chair or bench in shower? {YES/NO:21197} Elevated toilet seat or a handicapped toilet? {YES/NO:21197}  TIMED UP AND GO:  Was the test performed? {YES/NO:21197}.  Length of time to ambulate 10 feet:  sec.   {Appearance of UEAV:4098119}  Cognitive Function:        Immunizations Immunization History   Administered Date(s) Administered   Influenza,inj,Quad PF,6+ Mos 10/16/2017, 12/24/2018   PFIZER(Purple Top)SARS-COV-2 Vaccination 03/24/2020, 04/18/2020   Tdap 02/05/2013    {TDAP status:2101805}  {Flu Vaccine status:2101806}  {Pneumococcal vaccine status:2101807}  {Covid-19 vaccine status:2101808}  Qualifies for Shingles Vaccine? {YES/NO:21197}  Zostavax completed {YES/NO:21197}  {Shingrix Completed?:2101804}  Screening Tests Health Maintenance  Topic Date Due   MAMMOGRAM  Never done   Zoster Vaccines- Shingrix (1 of 2) Never done   PAP SMEAR-Modifier  12/19/2014   COVID-19 Vaccine (3 - Booster for Pfizer series) 09/18/2020  INFLUENZA VACCINE  07/10/2021   COLONOSCOPY (Pts 45-31yrs Insurance coverage will need to be confirmed)  05/10/2022   TETANUS/TDAP  02/05/2023   Hepatitis C Screening  Completed   HIV Screening  Completed   Pneumococcal Vaccine 84-8 Years old  Aged Out   HPV VACCINES  Aged Out    Health Maintenance  Health Maintenance Due  Topic Date Due   MAMMOGRAM  Never done   Zoster Vaccines- Shingrix (1 of 2) Never done   PAP SMEAR-Modifier  12/19/2014   COVID-19 Vaccine (3 - Booster for Pfizer series) 09/18/2020    {Colorectal cancer screening:2101809}  {Mammogram status:21018020}  {Bone Density status:21018021}  Lung Cancer Screening: (Low Dose CT Chest recommended if Age 59-80 years, 30 pack-year currently smoking OR have quit w/in 15years.) {DOES NOT does:27190::"does not"} qualify.   Lung Cancer Screening Referral: ***  Additional Screening:  Hepatitis C Screening: {DOES NOT does:27190::"does not"} qualify; Completed ***  Vision Screening: Recommended annual ophthalmology exams for early detection of glaucoma and other disorders of the eye. Is the patient up to date with their annual eye exam?  {YES/NO:21197} Who is the provider or what is the name of the office in which the patient attends annual eye exams? *** If pt is not established  with a provider, would they like to be referred to a provider to establish care? {YES/NO:21197}.   Dental Screening: Recommended annual dental exams for proper oral hygiene  Community Resource Referral / Chronic Care Management: CRR required this visit?  {YES/NO:21197}  CCM required this visit?  {YES/NO:21197}     Plan:     I have personally reviewed and noted the following in the patient's chart:   Medical and social history Use of alcohol, tobacco or illicit drugs  Current medications and supplements including opioid prescriptions. {Opioid [phone]} Functional ability and status Nutritional status Physical activity Advanced directives List of other physicians Hospitalizations, surgeries, and ER visits in previous 12 months Vitals Screenings to include cognitive, depression, and falls Referrals and appointments  In addition, I have reviewed and discussed with patient certain preventive protocols, quality metrics, and best practice recommendations. A written personalized care plan for preventive services as well as general preventive health recommendations were provided to patient.     March Rummage, LPN   0/17/5102   Nurse Notes: ***

## 2021-05-19 NOTE — Telephone Encounter (Signed)
Pt did not log on for virtual visit MWV, attempted to call the patient 3 times with no answer. Pt can reschedule next available appointment. Vernona Rieger Clarkson Rosselli,LPN

## 2021-05-22 ENCOUNTER — Other Ambulatory Visit: Payer: Self-pay

## 2021-06-07 ENCOUNTER — Encounter (HOSPITAL_COMMUNITY): Payer: Self-pay

## 2021-06-07 ENCOUNTER — Other Ambulatory Visit: Payer: Self-pay

## 2021-06-07 ENCOUNTER — Ambulatory Visit (HOSPITAL_COMMUNITY)
Admission: EM | Admit: 2021-06-07 | Discharge: 2021-06-07 | Disposition: A | Discharge status: Home / Self Care | Payer: Medicare Other | Attending: Urgent Care | Admitting: Urgent Care

## 2021-06-07 DIAGNOSIS — I498 Other specified cardiac arrhythmias: Secondary | ICD-10-CM

## 2021-06-07 DIAGNOSIS — R110 Nausea: Secondary | ICD-10-CM

## 2021-06-07 DIAGNOSIS — R5383 Other fatigue: Secondary | ICD-10-CM | POA: Diagnosis not present

## 2021-06-07 DIAGNOSIS — Z8679 Personal history of other diseases of the circulatory system: Secondary | ICD-10-CM

## 2021-06-07 LAB — POCT URINALYSIS DIPSTICK, ED / UC
Bilirubin Urine: NEGATIVE
Glucose, UA: NEGATIVE mg/dL
Hgb urine dipstick: NEGATIVE
Leukocytes,Ua: NEGATIVE
Nitrite: NEGATIVE
Protein, ur: NEGATIVE mg/dL
Specific Gravity, Urine: 1.025 (ref 1.005–1.030)
Urobilinogen, UA: 0.2 mg/dL (ref 0.0–1.0)
pH: 5.5 (ref 5.0–8.0)

## 2021-06-07 NOTE — ED Provider Notes (Signed)
[facility]   MRN: [id number] DOB: May 13, 1961  Subjective:   Mikayla Sweeney is a 60 y.o. female presenting for several week history of persistent intermittent chest/heart fluttering, fatigue, nausea without vomiting.  Patient has a history of hypertension, sarcoidosis, congestive heart failure.  Has not had to see a cardiologist for years.  Denies fever, cough, chest pain, shortness of breath, abdominal pain.  She does have a PCP.  She also has a cardiologist that she has not seen for years. She would also like to be checked for dehydration.   No current facility-administered medications for this encounter.  Current Outpatient Medications:    albuterol (PROVENTIL HFA;VENTOLIN HFA) 108 (90 Base) MCG/ACT inhaler, Inhale 1-2 puffs into the lungs every 6 (six) hours as needed. For wheeze or shortness of breath, Disp: 1 Inhaler, Rfl: 0   amLODipine (NORVASC) 10 MG tablet, TAKE 1 TABLET BY MOUTH EVERY DAY, Disp: 90 tablet, Rfl: 0   carvedilol (COREG) 25 MG tablet, TAKE 1 TABLET BY MOUTH TWICE A DAY WITH A MEAL, Disp: 180 tablet, Rfl: 1   fluticasone (FLONASE) 50 MCG/ACT nasal spray, Place 2 sprays into both nostrils daily., Disp: 1 g, Rfl: 0   furosemide (LASIX) 20 MG tablet, Take 1 tablet (20 mg total) by mouth daily., Disp: 90 tablet, Rfl: 3   hydrochlorothiazide (HYDRODIURIL) 25 MG tablet, Take 1 tablet (25 mg total) by mouth daily., Disp: 90 tablet, Rfl: 3   ibuprofen (ADVIL,MOTRIN) 600 MG tablet, Take 1 tablet (600 mg total) by mouth every 8 (eight) hours as needed., Disp: 30 tablet, Rfl: 0   losartan (COZAAR) 100 MG tablet, Take 1 tablet (100 mg total) by mouth daily., Disp: 90 tablet, Rfl: 3   nitrofurantoin, macrocrystal-monohydrate, (MACROBID) 100 MG capsule, Take 1 capsule (100 mg total) by mouth 2 (two) times daily., Disp: 14 capsule, Rfl: 0   omeprazole (PRILOSEC) 20 MG capsule, Take 1 capsule (20 mg total) by mouth daily., Disp: 90 capsule, Rfl: 3   ondansetron  (ZOFRAN-ODT) 8 MG disintegrating tablet, Take 1 tablet (8 mg total) by mouth every 8 (eight) hours as needed for nausea or vomiting., Disp: 20 tablet, Rfl: 0   oxybutynin (DITROPAN-XL) 5 MG 24 hr tablet, TAKE 1 TABLET BY MOUTH EVERYDAY AT BEDTIME, Disp: 90 tablet, Rfl: 1   phenazopyridine (PYRIDIUM) 200 MG tablet, Take 1 tablet (200 mg total) by mouth 3 (three) times daily as needed for pain., Disp: 30 tablet, Rfl: 0   rosuvastatin (CRESTOR) 10 MG tablet, Take 1 tablet (10 mg total) by mouth daily., Disp: 90 tablet, Rfl: 3   tiZANidine (ZANAFLEX) 4 MG tablet, Take 1 tablet (4 mg total) by mouth every 8 (eight) hours as needed for muscle spasms., Disp: 30 tablet, Rfl: 0   No Known Allergies  Past Medical History:  Diagnosis Date   Anxiety    Asthma    B12 deficiency    Bipolar 1 disorder (HCC)    CHF (congestive heart failure) (HCC)    Epilepsy (HCC)    Hypertension    Sarcoidosis      Past Surgical History:  Procedure Laterality Date   TONSILLECTOMY     TUBAL LIGATION      Family History  Problem Relation Age of Onset   Pancreatic cancer Mother    Hypertension Mother    Diabetes Mother    Heart disease Father    Gout Father    Cirrhosis Father    Heart disease Sister  CHF    Social History   Tobacco Use   Smoking status: Former    Pack years: 0.00   Smokeless tobacco: Never  Vaping Use   Vaping Use: Never used  Substance Use Topics   Alcohol use: No   Drug use: No    ROS   Objective:   Vitals: BP (!) 148/84 (BP Location: Left Arm)   Pulse 72   Temp 98.6 F (37 C) (Oral)   Resp 17   LMP 12/10/2014   SpO2 97%   Physical Exam Constitutional:      General: She is not in acute distress.    Appearance: Normal appearance. She is well-developed. She is not ill-appearing, toxic-appearing or diaphoretic.  HENT:     Head: Normocephalic and atraumatic.     Nose: Nose normal.     Mouth/Throat:     Mouth: Mucous membranes are moist.  Eyes:      Extraocular Movements: Extraocular movements intact.     Pupils: Pupils are equal, round, and reactive to light.  Cardiovascular:     Rate and Rhythm: Normal rate and regular rhythm.     Pulses: Normal pulses.     Heart sounds: Normal heart sounds. No murmur heard.   No friction rub. No gallop.  Pulmonary:     Effort: Pulmonary effort is normal. No respiratory distress.     Breath sounds: Normal breath sounds. No stridor. No wheezing, rhonchi or rales.  Skin:    General: Skin is warm and dry.     Findings: No rash.  Neurological:     Mental Status: She is alert and oriented to person, place, and time.  Psychiatric:        Mood and Affect: Mood normal.        Behavior: Behavior normal.        Thought Content: Thought content normal.        Judgment: Judgment normal.    Results for orders placed or performed during the hospital encounter of 06/07/21 (from the past 24 hour(s))  POC Urinalysis dipstick     Status: Abnormal   Collection Time: 06/07/21  6:41 PM  Result Value Ref Range   Glucose, UA NEGATIVE NEGATIVE mg/dL   Bilirubin Urine NEGATIVE NEGATIVE   Ketones, ur TRACE (A) NEGATIVE mg/dL   Specific Gravity, Urine 1.025 1.005 - 1.030   Hgb urine dipstick NEGATIVE NEGATIVE   pH 5.5 5.0 - 8.0   Protein, ur NEGATIVE NEGATIVE mg/dL   Urobilinogen, UA 0.2 0.0 - 1.0 mg/dL   Nitrite NEGATIVE NEGATIVE   Leukocytes,Ua NEGATIVE NEGATIVE     ED ECG REPORT   Date: 06/07/2021  Rate: 68bpm  Rhythm: normal sinus rhythm  QRS Axis: normal  Intervals: normal  ST/T Wave abnormalities: nonspecific T wave changes  Conduction Disutrbances:none  Narrative Interpretation: Sinus rhythm at 68bpm with nonspecific T wave flattening in lead III but very comparable to previous EKG.  Old EKG Reviewed: unchanged  I have personally reviewed the EKG tracing and agree with the computerized printout as noted.  Assessment and Plan :   PDMP not reviewed this encounter.  1. Other fatigue   2.  Fluttering heart   3. Nausea without vomiting   4. History of congestive heart failure     Clear cardiopulmonary exam, EKG without any acute findings.  Vital signs stable for very close follow-up with PCP and cardiology.  Maintain strict ER precautions.    Wallis Bamberg, [place] 06/07/21 [phone]

## 2021-06-07 NOTE — ED Triage Notes (Signed)
Pt presents with heart fluttering, fatigue, and nausea for past few weeks; pt was informed by PCP to go to ED for evaluation.

## 2021-06-07 NOTE — Discharge Instructions (Addendum)
Please make a follow-up appointment as soon as possible with your regular provider.  If you develop chest pain, shortness of breath then please report to the emergency room as this could be a sign of a heart attack.  He could also contact one of the cardiologist to schedule consultation for recheck or try her previous cardiologist for follow-up on her symptom of heart fluttering.

## 2021-07-04 ENCOUNTER — Ambulatory Visit (INDEPENDENT_AMBULATORY_CARE_PROVIDER_SITE_OTHER): Payer: Medicare Other

## 2021-07-04 DIAGNOSIS — Z0000 Encounter for general adult medical examination without abnormal findings: Secondary | ICD-10-CM | POA: Diagnosis not present

## 2021-07-04 DIAGNOSIS — Z0100 Encounter for examination of eyes and vision without abnormal findings: Secondary | ICD-10-CM

## 2021-07-04 NOTE — Patient Instructions (Signed)
Mikayla Sweeney , Thank you for taking time to come for your Medicare Wellness Visit. I appreciate your ongoing commitment to your health goals. Please review the following plan we discussed and let me know if I can assist you in the future.   Screening recommendations/referrals: Colonoscopy: 05/10/2012  due 2023 Mammogram: referral completed [id number] Bone Density: not of age  Recommended yearly ophthalmology/optometry visit for glaucoma screening and checkup Recommended yearly dental visit for hygiene and checkup  Vaccinations: Influenza vaccine: due in fall 2022  Pneumococcal vaccine: declined  Tdap vaccine: 02/05/2013 Shingles vaccine: declined     Advanced directives: none   Conditions/risks identified: none   Next appointment: none   Preventive Care 40-64 Years, Female Preventive care refers to lifestyle choices and visits with your health care provider that can promote health and wellness. What does preventive care include? A yearly physical exam. This is also called an annual well check. Dental exams once or twice a year. Routine eye exams. Ask your health care provider how often you should have your eyes checked. Personal lifestyle choices, including: Daily care of your teeth and gums. Regular physical activity. Eating a healthy diet. Avoiding tobacco and drug use. Limiting alcohol use. Practicing safe sex. Taking low-dose aspirin daily starting at age 2. Taking vitamin and mineral supplements as recommended by your health care provider. What happens during an annual well check? The services and screenings done by your health care provider during your annual well check will depend on your age, overall health, lifestyle risk factors, and family history of disease. Counseling  Your health care provider may ask you questions about your: Alcohol use. Tobacco use. Drug use. Emotional well-being. Home and relationship well-being. Sexual activity. Eating habits. Work and  work Statistician. Method of birth control. Menstrual cycle. Pregnancy history. Screening  You may have the following tests or measurements: Height, weight, and BMI. Blood pressure. Lipid and cholesterol levels. These may be checked every 5 years, or more frequently if you are over 50 years old. Skin check. Lung cancer screening. You may have this screening every year starting at age 83 if you have a 30-pack-year history of smoking and currently smoke or have quit within the past 15 years. Fecal occult blood test (FOBT) of the stool. You may have this test every year starting at age 68. Flexible sigmoidoscopy or colonoscopy. You may have a sigmoidoscopy every 5 years or a colonoscopy every 10 years starting at age 57. Hepatitis C blood test. Hepatitis B blood test. Sexually transmitted disease (STD) testing. Diabetes screening. This is done by checking your blood sugar (glucose) after you have not eaten for a while (fasting). You may have this done every 1-3 years. Mammogram. This may be done every 1-2 years. Talk to your health care provider about when you should start having regular mammograms. This may depend on whether you have a family history of breast cancer. BRCA-related cancer screening. This may be done if you have a family history of breast, ovarian, tubal, or peritoneal cancers. Pelvic exam and Pap test. This may be done every 3 years starting at age 69. Starting at age 80, this may be done every 5 years if you have a Pap test in combination with an HPV test. Bone density scan. This is done to screen for osteoporosis. You may have this scan if you are at high risk for osteoporosis. Discuss your test results, treatment options, and if necessary, the need for more tests with your health care provider. Vaccines  Your health care provider may recommend certain vaccines, such as: Influenza vaccine. This is recommended every year. Tetanus, diphtheria, and acellular pertussis (Tdap, Td)  vaccine. You may need a Td booster every 10 years. Zoster vaccine. You may need this after age 60. Pneumococcal 13-valent conjugate (PCV13) vaccine. You may need this if you have certain conditions and were not previously vaccinated. Pneumococcal polysaccharide (PPSV23) vaccine. You may need one or two doses if you smoke cigarettes or if you have certain conditions. Talk to your health care provider about which screenings and vaccines you need and how often you need them. This information is not intended to replace advice given to you by your health care provider. Make sure you discuss any questions you have with your health care provider. Document Released: 12/23/2015 Document Revised: 08/15/2016 Document Reviewed: 09/27/2015 Elsevier Interactive Patient Education  2017 [employer]    Fall Prevention in the Home Falls can cause injuries. They can happen to people of all ages. There are many things you can do to make your home safe and to help prevent falls. What can I do on the outside of my home? Regularly fix the edges of walkways and driveways and fix any cracks. Remove anything that might make you trip as you walk through a door, such as a raised step or threshold. Trim any bushes or trees on the path to your home. Use bright outdoor lighting. Clear any walking paths of anything that might make someone trip, such as rocks or tools. Regularly check to see if handrails are loose or broken. Make sure that both sides of any steps have handrails. Any raised decks and porches should have guardrails on the edges. Have any leaves, snow, or ice cleared regularly. Use sand or salt on walking paths during winter. Clean up any spills in your garage right away. This includes oil or grease spills. What can I do in the bathroom? Use night lights. Install grab bars by the toilet and in the tub and shower. Do not use towel bars as grab bars. Use non-skid mats or decals in the tub or shower. If you  need to sit down in the shower, use a plastic, non-slip stool. Keep the floor dry. Clean up any water that spills on the floor as soon as it happens. Remove soap buildup in the tub or shower regularly. Attach bath mats securely with double-sided non-slip rug tape. Do not have throw rugs and other things on the floor that can make you trip. What can I do in the bedroom? Use night lights. Make sure that you have a light by your bed that is easy to reach. Do not use any sheets or blankets that are too big for your bed. They should not hang down onto the floor. Have a firm chair that has side arms. You can use this for support while you get dressed. Do not have throw rugs and other things on the floor that can make you trip. What can I do in the kitchen? Clean up any spills right away. Avoid walking on wet floors. Keep items that you use a lot in easy-to-reach places. If you need to reach something above you, use a strong step stool that has a grab bar. Keep electrical cords out of the way. Do not use floor polish or wax that makes floors slippery. If you must use wax, use non-skid floor wax. Do not have throw rugs and other things on the floor that can make you trip. What   can I do with my stairs? Do not leave any items on the stairs. Make sure that there are handrails on both sides of the stairs and use them. Fix handrails that are broken or loose. Make sure that handrails are as long as the stairways. Check any carpeting to make sure that it is firmly attached to the stairs. Fix any carpet that is loose or worn. Avoid having throw rugs at the top or bottom of the stairs. If you do have throw rugs, attach them to the floor with carpet tape. Make sure that you have a light switch at the top of the stairs and the bottom of the stairs. If you do not have them, ask someone to add them for you. What else can I do to help prevent falls? Wear shoes that: Do not have high heels. Have rubber  bottoms. Are comfortable and fit you well. Are closed at the toe. Do not wear sandals. If you use a stepladder: Make sure that it is fully opened. Do not climb a closed stepladder. Make sure that both sides of the stepladder are locked into place. Ask someone to hold it for you, if possible. Clearly mark and make sure that you can see: Any grab bars or handrails. First and last steps. Where the edge of each step is. Use tools that help you move around (mobility aids) if they are needed. These include: Canes. Walkers. Scooters. Crutches. Turn on the lights when you go into a dark area. Replace any light bulbs as soon as they burn out. Set up your furniture so you have a clear path. Avoid moving your furniture around. If any of your floors are uneven, fix them. If there are any pets around you, be aware of where they are. Review your medicines with your doctor. Some medicines can make you feel dizzy. This can increase your chance of falling. Ask your doctor what other things that you can do to help prevent falls. This information is not intended to replace advice given to you by your health care provider. Make sure you discuss any questions you have with your health care provider. Document Released: 09/22/2009 Document Revised: 05/03/2016 Document Reviewed: 12/31/2014 Elsevier Interactive Patient Education  2017 [employer]  

## 2021-07-04 NOTE — Progress Notes (Signed)
Subjective:   Mikayla Sweeney is a 60 y.o. female who presents for an Initial Medicare Annual Wellness Visit.   Virtual Visit via Video Note  I connected with Mikayla Sweeney by a video enabled telemedicine application and verified that I am speaking with the correct person using two identifiers.  Location: Patient: Home Provider: Office Persons participating in the virtual visit: patient, provider   I discussed the limitations of evaluation and management by telemedicine and the availability of in person appointments. The patient expressed understanding and agreed to proceed.     Vernona Rieger Sohaib Vereen,LPN  Review of Systems    N/A       Objective:    There were no vitals filed for this visit. There is no height or weight on file to calculate BMI.  Advanced Directives 04/02/2017 12/24/2014 10/10/2014 07/26/2014  Does Patient Have a Medical Advance Directive? No No No No  Would patient like information on creating a medical advance directive? - - No - patient declined information Yes - Educational materials given    Current Medications (verified) Outpatient Encounter Medications as of 07/04/2021  Medication Sig   albuterol (PROVENTIL HFA;VENTOLIN HFA) 108 (90 Base) MCG/ACT inhaler Inhale 1-2 puffs into the lungs every 6 (six) hours as needed. For wheeze or shortness of breath   amLODipine (NORVASC) 10 MG tablet TAKE 1 TABLET BY MOUTH EVERY DAY   carvedilol (COREG) 25 MG tablet TAKE 1 TABLET BY MOUTH TWICE A DAY WITH A MEAL   fluticasone (FLONASE) 50 MCG/ACT nasal spray Place 2 sprays into both nostrils daily.   furosemide (LASIX) 20 MG tablet Take 1 tablet (20 mg total) by mouth daily.   hydrochlorothiazide (HYDRODIURIL) 25 MG tablet Take 1 tablet (25 mg total) by mouth daily.   ibuprofen (ADVIL,MOTRIN) 600 MG tablet Take 1 tablet (600 mg total) by mouth every 8 (eight) hours as needed.   losartan (COZAAR) 100 MG tablet Take 1 tablet (100 mg total) by mouth daily.   nitrofurantoin,  macrocrystal-monohydrate, (MACROBID) 100 MG capsule Take 1 capsule (100 mg total) by mouth 2 (two) times daily.   omeprazole (PRILOSEC) 20 MG capsule Take 1 capsule (20 mg total) by mouth daily.   ondansetron (ZOFRAN-ODT) 8 MG disintegrating tablet Take 1 tablet (8 mg total) by mouth every 8 (eight) hours as needed for nausea or vomiting.   oxybutynin (DITROPAN-XL) 5 MG 24 hr tablet TAKE 1 TABLET BY MOUTH EVERYDAY AT BEDTIME   phenazopyridine (PYRIDIUM) 200 MG tablet Take 1 tablet (200 mg total) by mouth 3 (three) times daily as needed for pain.   rosuvastatin (CRESTOR) 10 MG tablet Take 1 tablet (10 mg total) by mouth daily.   tiZANidine (ZANAFLEX) 4 MG tablet Take 1 tablet (4 mg total) by mouth every 8 (eight) hours as needed for muscle spasms.   No facility-administered encounter medications on file as of 07/04/2021.    Allergies (verified) Patient has no known allergies.   History: Past Medical History:  Diagnosis Date   Anxiety    Asthma    B12 deficiency    Bipolar 1 disorder (HCC)    CHF (congestive heart failure) (HCC)    Epilepsy (HCC)    Hypertension    Sarcoidosis    Past Surgical History:  Procedure Laterality Date   TONSILLECTOMY     TUBAL LIGATION     Family History  Problem Relation Age of Onset   Pancreatic cancer Mother    Hypertension Mother    Diabetes Mother  Heart disease Father    Gout Father    Cirrhosis Father    Heart disease Sister        CHF   Social History   Socioeconomic History   Marital status: Married    Spouse name: Not on file   Number of children: Not on file   Years of education: Not on file   Highest education level: Not on file  Occupational History   Not on file  Tobacco Use   Smoking status: Former   Smokeless tobacco: Never  Vaping Use   Vaping Use: Never used  Substance and Sexual Activity   Alcohol use: No   Drug use: No   Sexual activity: Not on file  Other Topics Concern   Not on file  Social History  Narrative   She has her own business    Married    Three children    5 grandchildren       She likes to spend time with her grandchildren    Social Determinants of Corporate investment banker Strain: Not on file  Food Insecurity: Not on file  Transportation Needs: Not on file  Physical Activity: Not on file  Stress: Not on file  Social Connections: Not on file    Tobacco Counseling Counseling given: Not Answered   Clinical Intake:                 Diabetic?no         Activities of Daily Living No flowsheet data found.  Patient Care Team: Shirline Frees, NP as PCP - General (Family Medicine)  Indicate any recent Medical Services you may have received from other than [facility] providers in the past year (date may be approximate).     Assessment:   This is a routine wellness examination for Mikayla Sweeney.  Hearing/Vision screen No results found.  Dietary issues and exercise activities discussed:     Goals Addressed   None    Depression Screen PHQ 2/9 Scores 06/17/2020  PHQ - 2 Score 0    Fall Risk Fall Risk  06/17/2020  Falls in the past year? 0    FALL RISK PREVENTION PERTAINING TO THE HOME:  Any stairs in or around the home? No  If so, are there any without handrails? No  Home free of loose throw rugs in walkways, pet beds, electrical cords, etc? Yes  Adequate lighting in your home to reduce risk of falls? Yes   ASSISTIVE DEVICES UTILIZED TO PREVENT FALLS:  Life alert? No  Use of a cane, walker or w/c? No  Grab bars in the bathroom? No  Shower chair or bench in shower? No  Elevated toilet seat or a handicapped toilet? No   Cognitive Function:    Normal cognitive status assessed by direct observation by this Nurse Health Advisor. No abnormalities found.      Immunizations Immunization History  Administered Date(s) Administered   Influenza,inj,Quad PF,6+ Mos 10/16/2017, 12/24/2018   PFIZER(Purple Top)SARS-COV-2 Vaccination 03/24/2020,  04/18/2020   Tdap 02/05/2013    TDAP status: Up to date  Flu Vaccine status: Up to date  Pneumococcal vaccine status: Declined,  Education has been provided regarding the importance of this vaccine but patient still declined. Advised may receive this vaccine at local pharmacy or Health Dept. Aware to provide a copy of the vaccination record if obtained from local pharmacy or Health Dept. Verbalized acceptance and understanding.   Covid-19 vaccine status: Completed vaccines  Qualifies for Shingles Vaccine? Yes  Zostavax completed No   Shingrix Completed?: No.    Education has been provided regarding the importance of this vaccine. Patient has been advised to call insurance company to determine out of pocket expense if they have not yet received this vaccine. Advised may also receive vaccine at local pharmacy or Health Dept. Verbalized acceptance and understanding.  Screening Tests Health Maintenance  Topic Date Due   MAMMOGRAM  Never done   Zoster Vaccines- Shingrix (1 of 2) Never done   PAP SMEAR-Modifier  12/19/2014   COVID-19 Vaccine (3 - Booster for Pfizer series) 09/18/2020   INFLUENZA VACCINE  07/10/2021   COLONOSCOPY (Pts 45-33yrs Insurance coverage will need to be confirmed)  05/10/2022   TETANUS/TDAP  02/05/2023   Hepatitis C Screening  Completed   HIV Screening  Completed   Pneumococcal Vaccine 76-41 Years old  Aged Out   HPV VACCINES  Aged Out    Health Maintenance  Health Maintenance Due  Topic Date Due   MAMMOGRAM  Never done   Zoster Vaccines- Shingrix (1 of 2) Never done   PAP SMEAR-Modifier  12/19/2014   COVID-19 Vaccine (3 - Booster for Pfizer series) 09/18/2020    Colorectal cancer screening: Type of screening: Colonoscopy. Completed 05/10/2012. Repeat every 10 years  Mammogram status: Ordered 0/023/2022. Pt provided with contact info and advised to call to schedule appt.   Bone Density status: Completed not of age . Results reflect: Bone density  results: NORMAL. Repeat every not of age  years.  Lung Cancer Screening: (Low Dose CT Chest recommended if Age 13-80 years, 30 pack-year currently smoking OR have quit w/in 15years.) does not qualify.   Lung Cancer Screening Referral: n/a  Additional Screening:  Hepatitis C Screening: does not qualify; Completed 12/24/2018  Vision Screening: Recommended annual ophthalmology exams for early detection of glaucoma and other disorders of the eye. Is the patient up to date with their annual eye exam?  No  Who is the provider or what is the name of the office in which the patient attends annual eye exams? Referral 07/04/2021 If pt is not established with a provider, would they like to be referred to a provider to establish care? Yes .   Dental Screening: Recommended annual dental exams for proper oral hygiene  Community Resource Referral / Chronic Care Management: CRR required this visit?  No   CCM required this visit?  No      Plan:     I have personally reviewed and noted the following in the patient's chart:   Medical and social history Use of alcohol, tobacco or illicit drugs  Current medications and supplements including opioid prescriptions. Patient is not currently taking opioid prescriptions. Functional ability and status Nutritional status Physical activity Advanced directives List of other physicians Hospitalizations, surgeries, and ER visits in previous 12 months Vitals Screenings to include cognitive, depression, and falls Referrals and appointments  In addition, I have reviewed and discussed with patient certain preventive protocols, quality metrics, and best practice recommendations. A written personalized care plan for preventive services as well as general preventive health recommendations were provided to patient.     March Rummage, LPN   [id number]/62/[id number]   Nurse Notes: none

## 2021-07-27 ENCOUNTER — Ambulatory Visit: Payer: Medicare Other | Admitting: Adult Health

## 2021-08-02 ENCOUNTER — Ambulatory Visit: Payer: Medicare Other | Admitting: Adult Health

## 2021-08-08 ENCOUNTER — Ambulatory Visit: Payer: Medicare Other | Admitting: Adult Health

## 2021-08-08 ENCOUNTER — Other Ambulatory Visit: Payer: Self-pay

## 2021-08-09 ENCOUNTER — Ambulatory Visit (INDEPENDENT_AMBULATORY_CARE_PROVIDER_SITE_OTHER): Payer: Medicare Other | Admitting: Adult Health

## 2021-08-09 ENCOUNTER — Encounter: Payer: Self-pay | Admitting: Adult Health

## 2021-08-09 ENCOUNTER — Ambulatory Visit (INDEPENDENT_AMBULATORY_CARE_PROVIDER_SITE_OTHER): Payer: Medicare Other

## 2021-08-09 VITALS — BP 130/86 | HR 63 | Temp 98.7°F | Ht 64.0 in | Wt 193.0 lb

## 2021-08-09 DIAGNOSIS — R110 Nausea: Secondary | ICD-10-CM | POA: Diagnosis not present

## 2021-08-09 DIAGNOSIS — R002 Palpitations: Secondary | ICD-10-CM | POA: Diagnosis not present

## 2021-08-09 DIAGNOSIS — K2100 Gastro-esophageal reflux disease with esophagitis, without bleeding: Secondary | ICD-10-CM

## 2021-08-09 LAB — CBC WITH DIFFERENTIAL/PLATELET
Basophils Absolute: 0 10*3/uL (ref 0.0–0.1)
Basophils Relative: 0.5 % (ref 0.0–3.0)
Eosinophils Absolute: 0 10*3/uL (ref 0.0–0.7)
Eosinophils Relative: 0.4 % (ref 0.0–5.0)
HCT: 41.1 % (ref 36.0–46.0)
Hemoglobin: 13.6 g/dL (ref 12.0–15.0)
Lymphocytes Relative: 22.7 % (ref 12.0–46.0)
Lymphs Abs: 1.9 10*3/uL (ref 0.7–4.0)
MCHC: 33.1 g/dL (ref 30.0–36.0)
MCV: 89.5 fl (ref 78.0–100.0)
Monocytes Absolute: 0.5 10*3/uL (ref 0.1–1.0)
Monocytes Relative: 6.4 % (ref 3.0–12.0)
Neutro Abs: 5.8 10*3/uL (ref 1.4–7.7)
Neutrophils Relative %: 70 % (ref 43.0–77.0)
Platelets: 235 10*3/uL (ref 150.0–400.0)
RBC: 4.59 Mil/uL (ref 3.87–5.11)
RDW: 13.9 % (ref 11.5–15.5)
WBC: 8.2 10*3/uL (ref 4.0–10.5)

## 2021-08-09 LAB — IBC PANEL
Iron: 89 ug/dL (ref 42–145)
Saturation Ratios: 23.1 % (ref 20.0–50.0)
TIBC: 385 ug/dL (ref 250.0–450.0)
Transferrin: 275 mg/dL (ref 212.0–360.0)

## 2021-08-09 LAB — TSH: TSH: 1.07 u[IU]/mL (ref 0.35–5.50)

## 2021-08-09 MED ORDER — OMEPRAZOLE 20 MG PO CPDR: 20.0000 mg | DELAYED_RELEASE_CAPSULE | Freq: Every day | ORAL | 0 refills | Status: DC

## 2021-08-09 MED ORDER — ONDANSETRON 8 MG PO TBDP: 8.0000 mg | ORAL_TABLET | Freq: Three times a day (TID) | ORAL | 0 refills | Status: DC | PRN

## 2021-08-09 NOTE — Patient Instructions (Signed)
It was great seeing you today   I have placed an order for a cardiac monitor - someone will call you to schedule   I will follow up with you regarding your blood work   Please schedule your physical exam

## 2021-08-09 NOTE — Progress Notes (Signed)
Subjective:    Patient ID: Mikayla Sweeney, female    DOB: 29-Mar-1961, 60 y.o.   MRN: [id number]  HPI  60 year old female who  has a past medical history of Anxiety, Asthma, B12 deficiency, Bipolar 1 disorder (HCC), CHF (congestive heart failure) (HCC), Epilepsy (HCC), Hypertension, and Sarcoidosis.  She presents to the office today for an acute issue of "chest fluttering".  She was seen at urgent care back in June 2022 when her symptoms started.  At this time she had persistent intermittent chest/heart fluttering, fatigue, nausea without vomiting.  At this time EKG was done which showed normal sinus rhythm with a rate of 68.  She does report that she was having symptoms during this urgent care visit.  Since then the "chest fluttering" has been present almost every day.  Symptoms last a few minutes and then resolved.  Symptoms are on the left chest and she feels as though her heart is fluttering".  Happens at different times daily.  Mostly when standing up.  Denies waking up from a sleep with the fluttering sensation.  Has been no change in caffeine intake, drinks 1 cup of coffee in the morning  Additionally she has been experiencing more acid reflux-like symptoms and nausea.  No longer taking Prilosec.  Denies fevers, chills, vomiting, diarrhea.   Review of Systems See HPI   Past Medical History:  Diagnosis Date   Anxiety    Asthma    B12 deficiency    Bipolar 1 disorder (HCC)    CHF (congestive heart failure) (HCC)    Epilepsy (HCC)    Hypertension    Sarcoidosis     Social History   Socioeconomic History   Marital status: Married    Spouse name: Not on file   Number of children: Not on file   Years of education: Not on file   Highest education level: Not on file  Occupational History   Not on file  Tobacco Use   Smoking status: Former   Smokeless tobacco: Never  Vaping Use   Vaping Use: Never used  Substance and Sexual Activity   Alcohol use: No   Drug use: No    Sexual activity: Not on file  Other Topics Concern   Not on file  Social History Narrative   She has her own business    Married    Three children    5 grandchildren       She likes to spend time with her grandchildren    Social Determinants of Corporate investment banker Strain: Low Risk    Difficulty of Paying Living Expenses: Not hard at all  Food Insecurity: No Food Insecurity   Worried About Programme researcher, broadcasting/film/video in the Last Year: Never true   Barista in the Last Year: Never true  Transportation Needs: No Transportation Needs   Lack of Transportation (Medical): No   Lack of Transportation (Non-Medical): No  Physical Activity: Inactive   Days of Exercise per Week: 0 days   Minutes of Exercise per Session: 0 min  Stress: No Stress Concern Present   Feeling of Stress : Not at all  Social Connections: Socially Isolated   Frequency of Communication with Friends and Family: Three times a week   Frequency of Social Gatherings with Friends and Family: Three times a week   Attends Religious Services: Never   Active Member of Clubs or Organizations: No   Attends Banker Meetings: Never  Marital Status: Divorced  Catering manager Violence: Not At Risk   Fear of Current or Ex-Partner: No   Emotionally Abused: No   Physically Abused: No   Sexually Abused: No    Past Surgical History:  Procedure Laterality Date   TONSILLECTOMY     TUBAL LIGATION      Family History  Problem Relation Age of Onset   Pancreatic cancer Mother    Hypertension Mother    Diabetes Mother    Heart disease Father    Gout Father    Cirrhosis Father    Heart disease Sister        CHF    No Known Allergies  Current Outpatient Medications on File Prior to Visit  Medication Sig Dispense Refill   albuterol (PROVENTIL HFA;VENTOLIN HFA) 108 (90 Base) MCG/ACT inhaler Inhale 1-2 puffs into the lungs every 6 (six) hours as needed. For wheeze or shortness of breath 1 Inhaler 0    amLODipine (NORVASC) 10 MG tablet TAKE 1 TABLET BY MOUTH EVERY DAY 90 tablet 0   carvedilol (COREG) 25 MG tablet TAKE 1 TABLET BY MOUTH TWICE A DAY WITH A MEAL 180 tablet 1   fluticasone (FLONASE) 50 MCG/ACT nasal spray Place 2 sprays into both nostrils daily. 1 g 0   furosemide (LASIX) 20 MG tablet Take 1 tablet (20 mg total) by mouth daily. 90 tablet 3   hydrochlorothiazide (HYDRODIURIL) 25 MG tablet Take 1 tablet (25 mg total) by mouth daily. 90 tablet 3   ibuprofen (ADVIL,MOTRIN) 600 MG tablet Take 1 tablet (600 mg total) by mouth every 8 (eight) hours as needed. 30 tablet 0   nitrofurantoin, macrocrystal-monohydrate, (MACROBID) 100 MG capsule Take 1 capsule (100 mg total) by mouth 2 (two) times daily. 14 capsule 0   oxybutynin (DITROPAN-XL) 5 MG 24 hr tablet TAKE 1 TABLET BY MOUTH EVERYDAY AT BEDTIME 90 tablet 1   phenazopyridine (PYRIDIUM) 200 MG tablet Take 1 tablet (200 mg total) by mouth 3 (three) times daily as needed for pain. 30 tablet 0   rosuvastatin (CRESTOR) 10 MG tablet Take 1 tablet (10 mg total) by mouth daily. 90 tablet 3   tiZANidine (ZANAFLEX) 4 MG tablet Take 1 tablet (4 mg total) by mouth every 8 (eight) hours as needed for muscle spasms. 30 tablet 0   losartan (COZAAR) 100 MG tablet Take 1 tablet (100 mg total) by mouth daily. 90 tablet 3   No current facility-administered medications on file prior to visit.    BP 130/86   Pulse 63   Temp 98.7 F (37.1 C) (Oral)   Ht 5\' 4"  (1.626 m)   Wt 193 lb (87.5 kg)   LMP 12/10/2014   SpO2 97%   BMI 33.13 kg/m       Objective:   Physical Exam Vitals and nursing note reviewed.  Constitutional:      Appearance: Normal appearance.  Cardiovascular:     Rate and Rhythm: Normal rate and regular rhythm.     Pulses: Normal pulses.     Heart sounds: Normal heart sounds.  Pulmonary:     Effort: Pulmonary effort is normal.     Breath sounds: Normal breath sounds.  Abdominal:     General: Abdomen is flat. Bowel sounds are  normal.     Palpations: Abdomen is soft.     Tenderness: There is abdominal tenderness in the epigastric area.  Skin:    General: Skin is warm and dry.     Capillary Refill: Capillary  refill takes less than 2 seconds.  Neurological:     General: No focal deficit present.     Mental Status: She is alert and oriented to person, place, and time.  Psychiatric:        Mood and Affect: Mood normal.        Behavior: Behavior normal.        Thought Content: Thought content normal.        Judgment: Judgment normal.       Assessment & Plan:  1. Palpitations -We will order a long-term monitor for 7 days to check for cardiac issues.  Also check CBC, iron, and TSH. - LONG TERM MONITOR (3-14 DAYS); Future - CBC with Differential/Platelet; Future - TSH; Future - IBC Panel([facility]); Future - IBC Panel([facility]) - TSH - CBC with Differential/Platelet  2. Gastroesophageal reflux disease with esophagitis -Start on GERD medication - omeprazole (PRILOSEC) 20 MG capsule; Take 1 capsule (20 mg total) by mouth daily.  Dispense: 90 capsule; Refill: 0  3. Nausea  - ondansetron (ZOFRAN-ODT) 8 MG disintegrating tablet; Take 1 tablet (8 mg total) by mouth every 8 (eight) hours as needed for nausea or vomiting.  Dispense: 20 tablet; Refill: 0  Shirline Frees, NP

## 2021-08-09 NOTE — Progress Notes (Unsigned)
Patient enrolled for [employer] to mail a 7 day ZIO XT monitor to her address on file. 

## 2021-08-10 ENCOUNTER — Encounter: Payer: Self-pay | Admitting: Adult Health

## 2021-08-10 ENCOUNTER — Telehealth: Payer: Self-pay | Admitting: Adult Health

## 2021-08-10 NOTE — Telephone Encounter (Signed)
Patient called following up on her previous message wanting to know if there could be further testing on thyroid.      Good callback number is [phone]   Please Advise

## 2021-08-10 NOTE — Telephone Encounter (Signed)
Error

## 2021-08-15 ENCOUNTER — Other Ambulatory Visit: Payer: Self-pay | Admitting: Adult Health

## 2021-08-15 DIAGNOSIS — I5031 Acute diastolic (congestive) heart failure: Secondary | ICD-10-CM

## 2021-08-15 DIAGNOSIS — I10 Essential (primary) hypertension: Secondary | ICD-10-CM

## 2021-08-22 DIAGNOSIS — R002 Palpitations: Secondary | ICD-10-CM | POA: Diagnosis not present

## 2021-08-31 ENCOUNTER — Ambulatory Visit (INDEPENDENT_AMBULATORY_CARE_PROVIDER_SITE_OTHER): Payer: Medicare Other | Admitting: Adult Health

## 2021-08-31 ENCOUNTER — Other Ambulatory Visit: Payer: Self-pay

## 2021-08-31 ENCOUNTER — Encounter: Payer: Self-pay | Admitting: Adult Health

## 2021-08-31 VITALS — BP 138/90 | HR 71 | Temp 98.6°F | Ht 64.5 in | Wt 198.0 lb

## 2021-08-31 DIAGNOSIS — F5101 Primary insomnia: Secondary | ICD-10-CM

## 2021-08-31 DIAGNOSIS — Z23 Encounter for immunization: Secondary | ICD-10-CM | POA: Diagnosis not present

## 2021-08-31 DIAGNOSIS — I5032 Chronic diastolic (congestive) heart failure: Secondary | ICD-10-CM

## 2021-08-31 DIAGNOSIS — R002 Palpitations: Secondary | ICD-10-CM

## 2021-08-31 DIAGNOSIS — F319 Bipolar disorder, unspecified: Secondary | ICD-10-CM

## 2021-08-31 DIAGNOSIS — I10 Essential (primary) hypertension: Secondary | ICD-10-CM

## 2021-08-31 DIAGNOSIS — K2100 Gastro-esophageal reflux disease with esophagitis, without bleeding: Secondary | ICD-10-CM | POA: Diagnosis not present

## 2021-08-31 DIAGNOSIS — Z0001 Encounter for general adult medical examination with abnormal findings: Secondary | ICD-10-CM

## 2021-08-31 LAB — LIPID PANEL
Cholesterol: 256 mg/dL — ABNORMAL HIGH (ref 0–200)
HDL: 78.8 mg/dL (ref >39.00)
LDL Cholesterol: 158 mg/dL — ABNORMAL HIGH (ref 0–99)
NonHDL: 176.75
Total CHOL/HDL Ratio: 3
Triglycerides: 95 mg/dL (ref 0.0–149.0)
VLDL: 19 mg/dL (ref 0.0–40.0)

## 2021-08-31 LAB — TSH: TSH: 2.03 u[IU]/mL (ref 0.35–5.50)

## 2021-08-31 LAB — VITAMIN D 25 HYDROXY (VIT D DEFICIENCY, FRACTURES): VITD: 24.67 ng/mL — ABNORMAL LOW (ref 30.00–100.00)

## 2021-08-31 LAB — HEMOGLOBIN A1C: Hgb A1c MFr Bld: 5.9 % (ref 4.6–6.5)

## 2021-08-31 LAB — MAGNESIUM: Magnesium: 1.9 mg/dL (ref 1.5–2.5)

## 2021-08-31 MED ORDER — AMLODIPINE BESYLATE 10 MG PO TABS: 10.0000 mg | ORAL_TABLET | Freq: Every day | ORAL | 3 refills | Status: DC

## 2021-08-31 MED ORDER — LOSARTAN POTASSIUM 100 MG PO TABS: 100.0000 mg | ORAL_TABLET | Freq: Every day | ORAL | 3 refills | Status: DC

## 2021-08-31 MED ORDER — FUROSEMIDE 20 MG PO TABS: 20.0000 mg | ORAL_TABLET | Freq: Every day | ORAL | 3 refills | Status: DC

## 2021-08-31 MED ORDER — TRAZODONE HCL 50 MG PO TABS: 50.0000 mg | ORAL_TABLET | Freq: Every evening | ORAL | 0 refills | Status: DC | PRN

## 2021-08-31 MED ORDER — HYDROCHLOROTHIAZIDE 25 MG PO TABS: 25.0000 mg | ORAL_TABLET | Freq: Every day | ORAL | 3 refills | Status: DC

## 2021-08-31 NOTE — Progress Notes (Signed)
Subjective:    Patient ID: Mikayla Sweeney, female    DOB: 10-08-1961, 60 y.o.   MRN: [id number]  HPI Patient presents for yearly preventative medicine examination. She is a pleasant 60 year old female who  has a past medical history of Anxiety, Asthma, B12 deficiency, Bipolar 1 disorder (HCC), CHF (congestive heart failure) (HCC), Epilepsy (HCC), Hypertension, and Sarcoidosis.  Essential Hypertension -prescribed Norvasc 10 mg, Coreg 25 mg twice daily, HCTZ 25 mg, and Cozaar 100 mg daily.  She denies headaches, blurred vision, lightheadedness, or dizziness BP Readings from Last 3 Encounters:  08/31/21 138/90  08/09/21 130/86  06/07/21 (!) 148/84   CHF -controlled with Lasix 20 mg daily.  She denies chest pain, shortness of breath, or lower extremity edema.  GERD-controlled with Prilosec 20 mg daily  OAB-symptoms are controlled with Ditropan 5 mg at bedtime  Palpitations -was seen for this issue roughly 1 month ago.  At this time she was reporting feeling of "her chest fluttering".  Symptoms have been present since June.  Associated symptoms included fatigue nausea.  She was seen at urgent care when her symptoms started and at this time EKG was done which showed normal sinus rhythm with a rate of 68, but she was not having symptoms at the time of the EKG.  When she is seen a month ago her work-up including CBC, TSH, and iron panel were within normal limits.  Cardiac monitor was ordered and she is sending it back today.   History of bipolar disorder-does not currently on medication and has not been on for quite a while.  She denies symptoms  Insomnia - long standing issue. Has been using Melatonin for years but feels as though it is no longer working for her. Has trouble falling asleep and staying asleep    All immunizations and health maintenance protocols were reviewed with the patient and needed orders were placed.  Appropriate screening laboratory values were ordered for the patient  including screening of hyperlipidemia, renal function and hepatic function.  Medication reconciliation,  past medical history, social history, problem list and allergies were reviewed in detail with the patient  Goals were established with regard to weight loss, exercise, and  diet in compliance with medications. Tries to eat healthy but is not exercising.  Wt Readings from Last 3 Encounters:  08/31/21 198 lb (89.8 kg)  08/09/21 193 lb (87.5 kg)  07/27/20 198 lb (89.8 kg)   She is up to date on colon cancer screening. Needs a referral to GYN   Review of Systems  Constitutional: Negative.   HENT: Negative.    Eyes: Negative.   Respiratory: Negative.    Cardiovascular:  Positive for palpitations.  Gastrointestinal: Negative.   Endocrine: Negative.   Genitourinary: Negative.   Musculoskeletal: Negative.   Skin: Negative.   Allergic/Immunologic: Negative.   Neurological: Negative.   Hematological: Negative.   Psychiatric/Behavioral:  Positive for sleep disturbance.    Past Medical History:  Diagnosis Date   Anxiety    Asthma    B12 deficiency    Bipolar 1 disorder (HCC)    CHF (congestive heart failure) (HCC)    Epilepsy (HCC)    Hypertension    Sarcoidosis     Social History   Socioeconomic History   Marital status: Married    Spouse name: Not on file   Number of children: Not on file   Years of education: Not on file   Highest education level: Not on file  Occupational  History   Not on file  Tobacco Use   Smoking status: Former   Smokeless tobacco: Never  Vaping Use   Vaping Use: Never used  Substance and Sexual Activity   Alcohol use: No   Drug use: No   Sexual activity: Not on file  Other Topics Concern   Not on file  Social History Narrative   She has her own business    Married    Three children    5 grandchildren       She likes to spend time with her grandchildren    Social Determinants of Corporate investment banker Strain: Low Risk     Difficulty of Paying Living Expenses: Not hard at all  Food Insecurity: No Food Insecurity   Worried About Programme researcher, broadcasting/film/video in the Last Year: Never true   Barista in the Last Year: Never true  Transportation Needs: No Transportation Needs   Lack of Transportation (Medical): No   Lack of Transportation (Non-Medical): No  Physical Activity: Inactive   Days of Exercise per Week: 0 days   Minutes of Exercise per Session: 0 min  Stress: No Stress Concern Present   Feeling of Stress : Not at all  Social Connections: Socially Isolated   Frequency of Communication with Friends and Family: Three times a week   Frequency of Social Gatherings with Friends and Family: Three times a week   Attends Religious Services: Never   Active Member of Clubs or Organizations: No   Attends Engineer, structural: Never   Marital Status: Divorced  Catering manager Violence: Not At Risk   Fear of Current or Ex-Partner: No   Emotionally Abused: No   Physically Abused: No   Sexually Abused: No    Past Surgical History:  Procedure Laterality Date   TONSILLECTOMY     TUBAL LIGATION      Family History  Problem Relation Age of Onset   Pancreatic cancer Mother    Hypertension Mother    Diabetes Mother    Heart disease Father    Gout Father    Cirrhosis Father    Heart disease Sister        CHF    No Known Allergies  Current Outpatient Medications on File Prior to Visit  Medication Sig Dispense Refill   albuterol (PROVENTIL HFA;VENTOLIN HFA) 108 (90 Base) MCG/ACT inhaler Inhale 1-2 puffs into the lungs every 6 (six) hours as needed. For wheeze or shortness of breath 1 Inhaler 0   carvedilol (COREG) 25 MG tablet TAKE 1 TABLET BY MOUTH TWICE A DAY WITH MEALS 180 tablet 1   fluticasone (FLONASE) 50 MCG/ACT nasal spray Place 2 sprays into both nostrils daily. 1 g 0   ibuprofen (ADVIL,MOTRIN) 600 MG tablet Take 1 tablet (600 mg total) by mouth every 8 (eight) hours as needed. 30 tablet  0   omeprazole (PRILOSEC) 20 MG capsule Take 1 capsule (20 mg total) by mouth daily. 90 capsule 0   ondansetron (ZOFRAN-ODT) 8 MG disintegrating tablet Take 1 tablet (8 mg total) by mouth every 8 (eight) hours as needed for nausea or vomiting. 20 tablet 0   rosuvastatin (CRESTOR) 10 MG tablet Take 1 tablet (10 mg total) by mouth daily. 90 tablet 3   ibuprofen (ADVIL) 800 MG tablet Take 800 mg by mouth every 6 (six) hours as needed.     No current facility-administered medications on file prior to visit.    BP 138/90  Pulse 71   Temp 98.6 F (37 C) (Oral)   Ht 5' 4.5" (1.638 m)   Wt 198 lb (89.8 kg)   LMP 12/10/2014   SpO2 96%   BMI 33.46 kg/m        Objective:   Physical Exam Vitals and nursing note reviewed.  Constitutional:      General: She is not in acute distress.    Appearance: Normal appearance. She is well-developed and overweight. She is not ill-appearing.  HENT:     Head: Normocephalic and atraumatic.     Right Ear: Tympanic membrane, ear canal and external ear normal. There is no impacted cerumen.     Left Ear: Tympanic membrane, ear canal and external ear normal. There is no impacted cerumen.     Nose: Nose normal. No congestion or rhinorrhea.     Mouth/Throat:     Mouth: Mucous membranes are moist.     Pharynx: Oropharynx is clear. No oropharyngeal exudate or posterior oropharyngeal erythema.  Eyes:     General:        Right eye: No discharge.        Left eye: No discharge.     Extraocular Movements: Extraocular movements intact.     Conjunctiva/sclera: Conjunctivae normal.     Pupils: Pupils are equal, round, and reactive to light.  Neck:     Thyroid: No thyromegaly.     Vascular: No carotid bruit.     Trachea: No tracheal deviation.  Cardiovascular:     Rate and Rhythm: Normal rate and regular rhythm.     Pulses: Normal pulses.     Heart sounds: Normal heart sounds. No murmur heard.   No friction rub. No gallop.  Pulmonary:     Effort: Pulmonary  effort is normal. No respiratory distress.     Breath sounds: Normal breath sounds. No stridor. No wheezing, rhonchi or rales.  Chest:     Chest wall: No tenderness.  Abdominal:     General: Abdomen is flat. Bowel sounds are normal. There is no distension.     Palpations: Abdomen is soft. There is no mass.     Tenderness: There is no abdominal tenderness. There is no right CVA tenderness, left CVA tenderness, guarding or rebound.     Hernia: No hernia is present.  Musculoskeletal:        General: No swelling, tenderness, deformity or signs of injury. Normal range of motion.     Cervical back: Normal range of motion and neck supple.     Right lower leg: No edema.     Left lower leg: No edema.  Lymphadenopathy:     Cervical: No cervical adenopathy.  Skin:    General: Skin is warm and dry.     Coloration: Skin is not jaundiced or pale.     Findings: No bruising, erythema, lesion or rash.  Neurological:     General: No focal deficit present.     Mental Status: She is alert and oriented to person, place, and time.     Cranial Nerves: No cranial nerve deficit.     Sensory: No sensory deficit.     Motor: No weakness.     Coordination: Coordination normal.     Gait: Gait normal.     Deep Tendon Reflexes: Reflexes normal.  Psychiatric:        Mood and Affect: Mood normal.        Behavior: Behavior normal.        Thought Content:  Thought content normal.        Judgment: Judgment normal.      Assessment & Plan:  1. Encounter for general adult medical examination with abnormal findings - Encouraged exercise through diet and exercise - Follow up in one year or sooner if needed - Hemoglobin A1c; Future - Lipid panel; Future - TSH; Future - Vitamin D, 25-hydroxy; Future - Magnesium; Future - Ambulatory referral to Gynecology - Magnesium - Vitamin D, 25-hydroxy - TSH - Lipid panel - Hemoglobin A1c  2. Gastroesophageal reflux disease with esophagitis without hemorrhage - Continue  with Prilosec - Hemoglobin A1c; Future - Lipid panel; Future - TSH; Future - TSH - Lipid panel - Hemoglobin A1c  3. Palpitations - Will wait and see what cardiac monitor showed - Hemoglobin A1c; Future - Lipid panel; Future - TSH; Future - TSH - Lipid panel - Hemoglobin A1c  4. Essential hypertension - Elevated today. Took her medications just prior to arrival  - Hemoglobin A1c; Future - Lipid panel; Future - TSH; Future - losartan (COZAAR) 100 MG tablet; Take 1 tablet (100 mg total) by mouth daily.  Dispense: 90 tablet; Refill: 3 - hydrochlorothiazide (HYDRODIURIL) 25 MG tablet; Take 1 tablet (25 mg total) by mouth daily.  Dispense: 90 tablet; Refill: 3 - furosemide (LASIX) 20 MG tablet; Take 1 tablet (20 mg total) by mouth daily.  Dispense: 90 tablet; Refill: 3 - amLODipine (NORVASC) 10 MG tablet; Take 1 tablet (10 mg total) by mouth daily.  Dispense: 90 tablet; Refill: 3 - TSH - Lipid panel - Hemoglobin A1c  5. Bipolar 1 disorder (HCC)  - Hemoglobin A1c; Future - Lipid panel; Future - TSH; Future - TSH - Lipid panel - Hemoglobin A1c  6. Chronic diastolic CHF (congestive heart failure) (HCC) - furosemide (LASIX) 20 MG tablet; Take 1 tablet (20 mg total) by mouth daily.  Dispense: 90 tablet; Refill: 3 - Hemoglobin A1c; Future - Lipid panel; Future - TSH; Future - TSH - Lipid panel - Hemoglobin A1c  8. Primary insomnia - Will trial her on Trazodone - traZODone (DESYREL) 50 MG tablet; Take 1 tablet (50 mg total) by mouth at bedtime as needed for sleep.  Dispense: 90 tablet; Refill: 0  9. Need for immunization against influenza  - Flu Vaccine QUAD 3mo+IM (Fluarix, Fluzone & Alfiuria Quad PF)  Shirline Frees, NP

## 2021-08-31 NOTE — Patient Instructions (Addendum)
It was great seeing you today   We will follow up with you regarding your blood work   I will be in touch when I get your cardiac monitor results back   I have sent in Trazodone to help you sleep, let me know how you do with this

## 2021-09-01 MED ORDER — ROSUVASTATIN CALCIUM 40 MG PO TABS: 40.0000 mg | ORAL_TABLET | Freq: Every day | ORAL | 3 refills | Status: DC

## 2021-09-01 NOTE — Addendum Note (Signed)
Addended by: Waymon Amato R on: 09/01/2021 11:14 AM   Modules accepted: Orders

## 2021-09-06 DIAGNOSIS — R002 Palpitations: Secondary | ICD-10-CM | POA: Diagnosis not present

## 2021-09-12 ENCOUNTER — Telehealth: Payer: Self-pay

## 2021-09-12 NOTE — Telephone Encounter (Signed)
Pt wants to know if you had the chance to look at her results.

## 2021-09-13 ENCOUNTER — Telehealth: Payer: Self-pay

## 2021-09-13 NOTE — Telephone Encounter (Signed)
Patient returned call and would like a call back.

## 2021-09-13 NOTE — Telephone Encounter (Signed)
Patient notified of  labs and heart monitor results and verbalized understanding.

## 2021-09-14 NOTE — Telephone Encounter (Signed)
This has been taking care of see result notes.  ?

## 2021-09-25 ENCOUNTER — Telehealth: Payer: Self-pay | Admitting: Adult Health

## 2021-09-25 NOTE — Telephone Encounter (Addendum)
Pt is calling to let cory know the FDA has approved mounjaro for DM and would like to know if you would prescribe  [id number] G[place] Otto, [place] - [address] Phone:  [phone]  Fax:  [phone]

## 2021-09-26 NOTE — Telephone Encounter (Signed)
Noted! Called pt no answer 

## 2021-09-26 NOTE — Telephone Encounter (Signed)
Please advise 

## 2021-09-27 NOTE — Telephone Encounter (Signed)
Patient notified of update  and verbalized understanding. 

## 2021-09-27 NOTE — Telephone Encounter (Signed)
Pt return your call and want a call back. 

## 2021-09-27 NOTE — Telephone Encounter (Signed)
Spoke to pt and she stated that she wanted to use it for weight loss. Per [name] FDA has not approved it for weight loss only for Diabetes. Called pt with update but no answer. Will call again shortly.

## 2021-10-02 DIAGNOSIS — I10 Essential (primary) hypertension: Secondary | ICD-10-CM | POA: Diagnosis not present

## 2021-10-02 DIAGNOSIS — E785 Hyperlipidemia, unspecified: Secondary | ICD-10-CM | POA: Diagnosis not present

## 2021-10-07 ENCOUNTER — Ambulatory Visit (HOSPITAL_COMMUNITY)
Admission: EM | Admit: 2021-10-07 | Discharge: 2021-10-07 | Disposition: A | Discharge status: Home / Self Care | Payer: Medicare Other | Attending: Internal Medicine | Admitting: Internal Medicine

## 2021-10-07 ENCOUNTER — Other Ambulatory Visit: Payer: Self-pay

## 2021-10-07 ENCOUNTER — Encounter (HOSPITAL_COMMUNITY): Payer: Self-pay

## 2021-10-07 DIAGNOSIS — R110 Nausea: Secondary | ICD-10-CM | POA: Insufficient documentation

## 2021-10-07 DIAGNOSIS — R3915 Urgency of urination: Secondary | ICD-10-CM | POA: Diagnosis not present

## 2021-10-07 DIAGNOSIS — R109 Unspecified abdominal pain: Secondary | ICD-10-CM | POA: Diagnosis not present

## 2021-10-07 DIAGNOSIS — R350 Frequency of micturition: Secondary | ICD-10-CM | POA: Insufficient documentation

## 2021-10-07 LAB — POCT URINALYSIS DIPSTICK, ED / UC
Bilirubin Urine: NEGATIVE
Glucose, UA: NEGATIVE mg/dL
Hgb urine dipstick: NEGATIVE
Leukocytes,Ua: NEGATIVE
Nitrite: NEGATIVE
Protein, ur: NEGATIVE mg/dL
Specific Gravity, Urine: 1.025 (ref 1.005–1.030)
Urobilinogen, UA: 0.2 mg/dL (ref 0.0–1.0)
pH: 6 (ref 5.0–8.0)

## 2021-10-07 MED ORDER — SULFAMETHOXAZOLE-TRIMETHOPRIM 800-160 MG PO TABS: 1.0000 | ORAL_TABLET | Freq: Two times a day (BID) | ORAL | 0 refills | Status: AC

## 2021-10-07 NOTE — ED Provider Notes (Signed)
[facility]    CSN: [id number] Arrival date & time: 10/07/21  1434      History   Chief Complaint Chief Complaint  Patient presents with   Back Pain   Urinary Frequency   Urinary Urgency   Nausea   Headache    HPI Mikayla Sweeney is a 60 y.o. female.   Pt is c/o of frequency, urgency, nausea, headaches, and back pain for the past week that has been getting progressively worse, patient denies burning with urination, visible blood in urine or urine malodor at this time.  Urine dip today was unremarkable for urinary tract infection.  Patient reports history of recurrent UTI.  The history is provided by the patient.   Past Medical History:  Diagnosis Date   Anxiety    Asthma    B12 deficiency    Bipolar 1 disorder (HCC)    CHF (congestive heart failure) (HCC)    Epilepsy (HCC)    Hypertension    Sarcoidosis     Patient Active Problem List   Diagnosis Date Noted   Acute diastolic heart failure (HCC) 07/27/2014   Pulmonary edema 07/26/2014   Essential hypertension 07/26/2014   Sarcoidosis 07/26/2014   Bipolar 1 disorder (HCC) 07/26/2014   Headache 07/26/2014    Past Surgical History:  Procedure Laterality Date   TONSILLECTOMY     TUBAL LIGATION      OB History   No obstetric history on file.      Home Medications    Prior to Admission medications   Medication Sig Start Date End Date Taking? Authorizing Provider  sulfamethoxazole-trimethoprim (BACTRIM DS) 800-160 MG tablet Take 1 tablet by mouth 2 (two) times daily for 5 days. 10/07/21 10/12/21 Yes Theadora Rama Scales, PA-C  albuterol (PROVENTIL HFA;VENTOLIN HFA) 108 (90 Base) MCG/ACT inhaler Inhale 1-2 puffs into the lungs every 6 (six) hours as needed. For wheeze or shortness of breath 12/14/18   Cathie Hoops, Amy V, PA-C  amLODipine (NORVASC) 10 MG tablet Take 1 tablet (10 mg total) by mouth daily. 08/31/21   [name], [name], NP  carvedilol (COREG) 25 MG tablet TAKE 1 TABLET BY MOUTH TWICE A DAY WITH MEALS  08/15/21   [name], [name], NP  fluticasone (FLONASE) 50 MCG/ACT nasal spray Place 2 sprays into both nostrils daily. 12/14/18   Cathie Hoops, Amy V, PA-C  furosemide (LASIX) 20 MG tablet Take 1 tablet (20 mg total) by mouth daily. 08/31/21   [name], [name], NP  hydrochlorothiazide (HYDRODIURIL) 25 MG tablet Take 1 tablet (25 mg total) by mouth daily. 08/31/21   [name], [name], NP  ibuprofen (ADVIL) 800 MG tablet Take 800 mg by mouth every 6 (six) hours as needed. 08/09/21   [provider]  ibuprofen (ADVIL,MOTRIN) 600 MG tablet Take 1 tablet (600 mg total) by mouth every 8 (eight) hours as needed. 09/02/18   [name], [name], NP  losartan (COZAAR) 100 MG tablet Take 1 tablet (100 mg total) by mouth daily. 08/31/21 11/29/21  [name], [name], NP  omeprazole (PRILOSEC) 20 MG capsule Take 1 capsule (20 mg total) by mouth daily. 08/09/21   [name], [name], NP  ondansetron (ZOFRAN-ODT) 8 MG disintegrating tablet Take 1 tablet (8 mg total) by mouth every 8 (eight) hours as needed for nausea or vomiting. 08/09/21   [name], [name], NP  rosuvastatin (CRESTOR) 40 MG tablet Take 1 tablet (40 mg total) by mouth daily. 09/01/21   [name], [name], NP  traZODone (DESYREL) 50 MG tablet Take 1 tablet (50 mg total) by  mouth at bedtime as needed for sleep. 08/31/21   Shirline Frees, NP    Family History Family History  Problem Relation Age of Onset   Pancreatic cancer Mother    Hypertension Mother    Diabetes Mother    Heart disease Father    Gout Father    Cirrhosis Father    Heart disease Sister        CHF    Social History Social History   Tobacco Use   Smoking status: Former   Smokeless tobacco: Never  Building services engineer Use: Never used  Substance Use Topics   Alcohol use: No   Drug use: No     Allergies   Patient has no known allergies.   Review of Systems Review of Systems Pertinent findings noted in history of present illness.    Physical Exam Triage Vital Signs ED Triage Vitals  Enc Vitals  Group     BP 10/06/21 0827 (!) 147/82     Pulse Rate 10/06/21 0827 72     Resp 10/06/21 0827 18     Temp 10/06/21 0827 98.3 F (36.8 C)     Temp Source 10/06/21 0827 Oral     SpO2 10/06/21 0827 98 %     Weight --      Height --      Head Circumference --      Peak Flow --      Pain Score 10/06/21 0826 5     Pain Loc --      Pain Edu? --      Excl. in GC? --    No data found.  Updated Vital Signs BP (!) 148/78 (BP Location: Right Arm)   Pulse 81   Temp 97.9 F (36.6 C) (Oral)   Resp 18   LMP 12/10/2014   SpO2 99%   Visual Acuity Right Eye Distance:   Left Eye Distance:   Bilateral Distance:    Right Eye Near:   Left Eye Near:    Bilateral Near:     Physical Exam Vitals and nursing note reviewed.  Constitutional:      General: She is not in acute distress.    Appearance: Normal appearance. She is not ill-appearing.  HENT:     Head: Normocephalic and atraumatic.  Eyes:     General: Lids are normal.        Right eye: No discharge.        Left eye: No discharge.     Extraocular Movements: Extraocular movements intact.     Conjunctiva/sclera: Conjunctivae normal.     Right eye: Right conjunctiva is not injected.     Left eye: Left conjunctiva is not injected.  Neck:     Trachea: Trachea and phonation normal.  Cardiovascular:     Rate and Rhythm: Normal rate and regular rhythm.     Pulses: Normal pulses.     Heart sounds: Normal heart sounds. No murmur heard.   No friction rub. No gallop.  Pulmonary:     Effort: Pulmonary effort is normal. No accessory muscle usage, prolonged expiration or respiratory distress.     Breath sounds: Normal breath sounds. No stridor, decreased air movement or transmitted upper airway sounds. No decreased breath sounds, wheezing, rhonchi or rales.  Chest:     Chest wall: No tenderness.  Abdominal:     Tenderness: There is abdominal tenderness in the suprapubic area.  Musculoskeletal:        General: Normal range  of motion.      Cervical back: Normal range of motion and neck supple. Normal range of motion.  Lymphadenopathy:     Cervical: No cervical adenopathy.  Skin:    General: Skin is warm and dry.     Findings: No erythema or rash.  Neurological:     General: No focal deficit present.     Mental Status: She is alert and oriented to person, place, and time.  Psychiatric:        Mood and Affect: Mood normal.        Behavior: Behavior normal.     UC Treatments / Results  Labs (all labs ordered are listed, but only abnormal results are displayed) Labs Reviewed  URINE CULTURE - Abnormal; Notable for the following components:      Result Value   Culture MULTIPLE SPECIES PRESENT, SUGGEST RECOLLECTION (*)    All other components within normal limits  POCT URINALYSIS DIPSTICK, ED / UC - Abnormal; Notable for the following components:   Ketones, ur TRACE (*)    All other components within normal limits  POCT URINALYSIS DIPSTICK, ED / UC    EKG   Radiology No results found.  Procedures Procedures (including critical care time)  Medications Ordered in UC Medications - No data to display  Initial Impression / Assessment and Plan / UC Course  I have reviewed the triage vital signs and the nursing notes.  Pertinent labs & imaging results that were available during my care of the patient were reviewed by me and considered in my medical decision making (see chart for details).     We will treat patient empirically for presumed urinary tract infection.  Urine culture will be performed, antibiotic regimen will be adjusted as needed.  Patient given return precautions.  Patient verbalized understanding and agreement of plan as discussed.  All questions were addressed during visit.  Please see discharge instructions below for further details of plan.  Final Clinical Impressions(s) / UC Diagnoses   Final diagnoses:  Urinary frequency  Urgency of urination  Flank pain  Nausea without vomiting      Discharge Instructions      Please begin taking Bactrim, broad-spectrum antibiotic used to treat urinary tract infections now while we are waiting for the results of your urine culture.  Please take 1 tablet twice daily.  If you begin to have significant relief within the next 24 hours and you received a phone call that your urine culture was negative, I recommend that you complete the full 5-day course.  If you do not have significant improvement of your symptoms the next 24 to 48 hours, please return for repeat evaluation, preferably first thing in the morning when your urine is more concentrated.     ED Prescriptions     Medication Sig Dispense Auth. Provider   sulfamethoxazole-trimethoprim (BACTRIM DS) 800-160 MG tablet Take 1 tablet by mouth 2 (two) times daily for 5 days. 10 tablet Theadora Rama Scales, PA-C      PDMP not reviewed this encounter.    Theadora Rama Scales, [place] 10/09/21 [phone]

## 2021-10-07 NOTE — ED Triage Notes (Signed)
Pt is c/o of frequency, urgency, nausea, headaches, and back pain for the past week; pt denies dysuria

## 2021-10-07 NOTE — Discharge Instructions (Addendum)
Please begin taking Bactrim, broad-spectrum antibiotic used to treat urinary tract infections now while we are waiting for the results of your urine culture.  Please take 1 tablet twice daily.  If you begin to have significant relief within the next 24 hours and you received a phone call that your urine culture was negative, I recommend that you complete the full 5-day course.  If you do not have significant improvement of your symptoms the next 24 to 48 hours, please return for repeat evaluation, preferably first thing in the morning when your urine is more concentrated.

## 2021-10-08 LAB — URINE CULTURE

## 2021-10-10 ENCOUNTER — Telehealth: Payer: Self-pay | Admitting: Adult Health

## 2021-10-10 NOTE — Telephone Encounter (Signed)
Please advise 

## 2021-10-10 NOTE — Telephone Encounter (Signed)
Patient called to have Mikayla Sweeney look at her lab results from urgent care and call something else in because she is getting better after three days of taking the medication urgent care prescribed. Patient also states nurse called her from urgent care and told her that they had found other things in the culture but were going to just prescribe her something for UTI. Patient refused appointment twice, wants a call back to discuss results from urgent and to be prescribed something else if possible first. I did let patient know that she would most likely need to be seen for them to prescribe something else.       Please advise

## 2021-10-11 NOTE — Telephone Encounter (Signed)
Patient notified of update  and verbalized understanding. 

## 2021-10-11 NOTE — Telephone Encounter (Signed)
Noted! Lm for pt to return call.  

## 2021-10-19 DIAGNOSIS — R7303 Prediabetes: Secondary | ICD-10-CM | POA: Diagnosis not present

## 2021-10-19 DIAGNOSIS — I10 Essential (primary) hypertension: Secondary | ICD-10-CM | POA: Diagnosis not present

## 2021-10-20 DIAGNOSIS — R7303 Prediabetes: Secondary | ICD-10-CM | POA: Diagnosis not present

## 2021-11-04 ENCOUNTER — Other Ambulatory Visit: Payer: Self-pay | Admitting: Adult Health

## 2021-11-04 DIAGNOSIS — K2100 Gastro-esophageal reflux disease with esophagitis, without bleeding: Secondary | ICD-10-CM

## 2021-11-21 ENCOUNTER — Other Ambulatory Visit: Payer: Self-pay | Admitting: Adult Health

## 2021-11-21 DIAGNOSIS — F5101 Primary insomnia: Secondary | ICD-10-CM

## 2021-12-14 DIAGNOSIS — R7303 Prediabetes: Secondary | ICD-10-CM | POA: Diagnosis not present

## 2021-12-15 ENCOUNTER — Other Ambulatory Visit: Payer: Self-pay | Admitting: Adult Health

## 2021-12-15 DIAGNOSIS — R110 Nausea: Secondary | ICD-10-CM

## 2021-12-18 DIAGNOSIS — J019 Acute sinusitis, unspecified: Secondary | ICD-10-CM | POA: Diagnosis not present

## 2022-01-11 DIAGNOSIS — R7303 Prediabetes: Secondary | ICD-10-CM | POA: Diagnosis not present

## 2022-01-11 DIAGNOSIS — I10 Essential (primary) hypertension: Secondary | ICD-10-CM | POA: Diagnosis not present

## 2022-02-10 ENCOUNTER — Other Ambulatory Visit: Payer: Self-pay | Admitting: Adult Health

## 2022-02-10 DIAGNOSIS — K2100 Gastro-esophageal reflux disease with esophagitis, without bleeding: Secondary | ICD-10-CM

## 2022-02-16 DIAGNOSIS — J309 Allergic rhinitis, unspecified: Secondary | ICD-10-CM | POA: Diagnosis not present

## 2022-02-16 DIAGNOSIS — J019 Acute sinusitis, unspecified: Secondary | ICD-10-CM | POA: Diagnosis not present

## 2022-02-19 ENCOUNTER — Other Ambulatory Visit: Payer: Self-pay | Admitting: Adult Health

## 2022-02-19 DIAGNOSIS — I10 Essential (primary) hypertension: Secondary | ICD-10-CM

## 2022-02-19 DIAGNOSIS — I5031 Acute diastolic (congestive) heart failure: Secondary | ICD-10-CM

## 2022-02-20 ENCOUNTER — Other Ambulatory Visit: Payer: Self-pay | Admitting: Adult Health

## 2022-02-20 DIAGNOSIS — F5101 Primary insomnia: Secondary | ICD-10-CM

## 2022-05-19 ENCOUNTER — Other Ambulatory Visit: Payer: Self-pay | Admitting: Adult Health

## 2022-05-19 DIAGNOSIS — F5101 Primary insomnia: Secondary | ICD-10-CM

## 2022-05-21 ENCOUNTER — Encounter: Payer: Self-pay | Admitting: Family Medicine

## 2022-05-21 ENCOUNTER — Telehealth (INDEPENDENT_AMBULATORY_CARE_PROVIDER_SITE_OTHER): Payer: Medicare Other | Admitting: Family Medicine

## 2022-05-21 VITALS — Ht 64.5 in

## 2022-05-21 DIAGNOSIS — H00011 Hordeolum externum right upper eyelid: Secondary | ICD-10-CM

## 2022-05-21 MED ORDER — ERYTHROMYCIN 5 MG/GM OP OINT: 1.0000 "application " | TOPICAL_OINTMENT | Freq: Every day | OPHTHALMIC | 0 refills | Status: AC

## 2022-05-21 NOTE — Progress Notes (Signed)
Virtual Visit via Video Note I connected with Mikayla Sweeney on 05/21/22 by a video enabled telemedicine application and verified that I am speaking with the correct person using two identifiers.  Location patient: home Location provider:work office Persons participating in the virtual visit: patient, provider  I discussed the limitations of evaluation and management by telemedicine and the availability of in person appointments. The patient expressed understanding and agreed to proceed.  Chief Complaint  Patient presents with   Stye   HPI: [name] is a 61 yo female with hx of sarcoidosis, hypertension, diastolic dysfunction, and bipolar disorder c/o 2 days of right upper eye lid pain,erythema,and edema.  She has not noted unusual/frequent headaches, visual changes, conjunctival erythema, or eye pain. She has had some clear crusty matter around eye in the morning when she first wakes up.  Problem is constant and stable. She tried a "cream" that was prescribed for similar problem in the past, she thinks it is expired. Negative for fever, chills, nasal congestion, sore throat, cough, wheezing, dyspnea, or a skin rash.  ROS: See pertinent positives and negatives per HPI.  Past Medical History:  Diagnosis Date   Anxiety    Asthma    B12 deficiency    Bipolar 1 disorder (HCC)    CHF (congestive heart failure) (HCC)    Epilepsy (HCC)    Hypertension    Sarcoidosis     Past Surgical History:  Procedure Laterality Date   TONSILLECTOMY     TUBAL LIGATION      Family History  Problem Relation Age of Onset   Pancreatic cancer Mother    Hypertension Mother    Diabetes Mother    Heart disease Father    Gout Father    Cirrhosis Father    Heart disease Sister        CHF    Social History   Socioeconomic History   Marital status: Married    Spouse name: Not on file   Number of children: Not on file   Years of education: Not on file   Highest education level: Not on file   Occupational History   Not on file  Tobacco Use   Smoking status: Former   Smokeless tobacco: Never  Vaping Use   Vaping Use: Never used  Substance and Sexual Activity   Alcohol use: No   Drug use: No   Sexual activity: Not Currently    Birth control/protection: None  Other Topics Concern   Not on file  Social History Narrative   She has her own business    Married    Three children    5 grandchildren       She likes to spend time with her grandchildren    Social Determinants of Health   Financial Resource Strain: Low Risk  (07/04/2021)   Overall Financial Resource Strain (CARDIA)    Difficulty of Paying Living Expenses: Not hard at all  Food Insecurity: No Food Insecurity (07/04/2021)   Hunger Vital Sign    Worried About Running Out of Food in the Last Year: Never true    Ran Out of Food in the Last Year: Never true  Transportation Needs: No Transportation Needs (07/04/2021)   PRAPARE - Administrator, Civil ServiceTransportation    Lack of Transportation (Medical): No    Lack of Transportation (Non-Medical): No  Physical Activity: Inactive (07/04/2021)   Exercise Vital Sign    Days of Exercise per Week: 0 days    Minutes of Exercise per Session:  0 min  Stress: No Stress Concern Present (07/04/2021)   [employer] of Occupational Health - Occupational Stress Questionnaire    Feeling of Stress : Not at all  Social Connections: Socially Isolated (07/04/2021)   Social Connection and Isolation Panel [NHANES]    Frequency of Communication with Friends and Family: Three times a week    Frequency of Social Gatherings with Friends and Family: Three times a week    Attends Religious Services: Never    Active Member of Clubs or Organizations: No    Attends Banker Meetings: Never    Marital Status: Divorced  Catering manager Violence: Not At Risk (07/04/2021)   Humiliation, Afraid, Rape, and Kick questionnaire    Fear of Current or Ex-Partner: No    Emotionally Abused: No    Physically  Abused: No    Sexually Abused: No   Current Outpatient Medications:    albuterol (PROVENTIL HFA;VENTOLIN HFA) 108 (90 Base) MCG/ACT inhaler, Inhale 1-2 puffs into the lungs every 6 (six) hours as needed. For wheeze or shortness of breath, Disp: 1 Inhaler, Rfl: 0   amLODipine (NORVASC) 10 MG tablet, Take 1 tablet (10 mg total) by mouth daily., Disp: 90 tablet, Rfl: 3   carvedilol (COREG) 25 MG tablet, TAKE 1 TABLET BY MOUTH TWICE A DAY WITH MEALS, Disp: 180 tablet, Rfl: 1   fluticasone (FLONASE) 50 MCG/ACT nasal spray, Place 2 sprays into both nostrils daily., Disp: 1 g, Rfl: 0   furosemide (LASIX) 20 MG tablet, Take 1 tablet (20 mg total) by mouth daily., Disp: 90 tablet, Rfl: 3   hydrochlorothiazide (HYDRODIURIL) 25 MG tablet, Take 1 tablet (25 mg total) by mouth daily., Disp: 90 tablet, Rfl: 3   ibuprofen (ADVIL) 800 MG tablet, Take 800 mg by mouth every 6 (six) hours as needed., Disp: , Rfl:    ibuprofen (ADVIL,MOTRIN) 600 MG tablet, Take 1 tablet (600 mg total) by mouth every 8 (eight) hours as needed., Disp: 30 tablet, Rfl: 0   omeprazole (PRILOSEC) 20 MG capsule, TAKE 1 CAPSULE BY MOUTH EVERY DAY, Disp: 90 capsule, Rfl: 0   ondansetron (ZOFRAN-ODT) 8 MG disintegrating tablet, TAKE 1 TABLET BY MOUTH EVERY 8 HOURS AS NEEDED FOR NAUSEA OR VOMITING., Disp: 20 tablet, Rfl: 0   rosuvastatin (CRESTOR) 40 MG tablet, Take 1 tablet (40 mg total) by mouth daily., Disp: 90 tablet, Rfl: 3   traZODone (DESYREL) 50 MG tablet, TAKE 1 TABLET BY MOUTH EVERY DAY AT BEDTIME AS NEEDED FOR SLEEP, Disp: 90 tablet, Rfl: 0   losartan (COZAAR) 100 MG tablet, Take 1 tablet (100 mg total) by mouth daily., Disp: 90 tablet, Rfl: 3  EXAM:  VITALS per patient if applicable:Ht 5' 4.5" (1.638 m)   LMP 12/10/2014   BMI 33.46 kg/m   GENERAL: alert, oriented, appears well and in no acute distress  HEENT: atraumatic, conjunctiva clear, no obvious abnormalities on inspection of external nose and ears Negative for eye  drainage. Right upper eye lid with mild edema and erythema. EOM seem intact.  NECK: normal movements of the head and neck  LUNGS: on inspection no signs of respiratory distress, breathing rate appears normal, no obvious gross SOB, gasping or wheezing  CV: no obvious cyanosis  PSYCH/NEURO: pleasant and cooperative, no obvious depression or anxiety, speech and thought processing grossly intact  ASSESSMENT AND PLAN:  Discussed the following assessment and plan:  Hordeolum externum of right upper eyelid - Plan: erythromycin ophthalmic ointment We discussed diagnosis, prognosis, and treatment. Explained  that most of the time there is not an associated bacterial process, still topical antibiotic recommended at bedtime for 7 to 10 days. Local heat, intermittently throughout the day. Instructed about warning signs. Follow-up with PCP as needed.  We discussed possible serious and likely etiologies, options for evaluation and workup, limitations of telemedicine visit vs in person visit, treatment, treatment risks and precautions. The patient was advised to call back or seek an in-person evaluation if the symptoms worsen or if the condition fails to improve as anticipated. I discussed the assessment and treatment plan with the patient. The patient was provided an opportunity to ask questions and all were answered. The patient agreed with the plan and demonstrated an understanding of the instructions.  Return if symptoms worsen or fail to improve.  Dyshaun Bonzo G. [place], MD  [facility]. [place] office.

## 2022-05-24 ENCOUNTER — Other Ambulatory Visit: Payer: Self-pay | Admitting: Adult Health

## 2022-05-24 DIAGNOSIS — K2100 Gastro-esophageal reflux disease with esophagitis, without bleeding: Secondary | ICD-10-CM

## 2022-06-01 ENCOUNTER — Ambulatory Visit (HOSPITAL_COMMUNITY)
Admission: EM | Admit: 2022-06-01 | Discharge: 2022-06-01 | Disposition: A | Discharge status: Home / Self Care | Payer: Medicare Other | Attending: Family Medicine | Admitting: Family Medicine

## 2022-06-01 ENCOUNTER — Encounter (HOSPITAL_COMMUNITY): Payer: Self-pay | Admitting: Emergency Medicine

## 2022-06-01 DIAGNOSIS — R5383 Other fatigue: Secondary | ICD-10-CM | POA: Insufficient documentation

## 2022-06-01 DIAGNOSIS — M545 Low back pain, unspecified: Secondary | ICD-10-CM | POA: Insufficient documentation

## 2022-06-01 DIAGNOSIS — R110 Nausea: Secondary | ICD-10-CM | POA: Diagnosis not present

## 2022-06-01 DIAGNOSIS — R102 Pelvic and perineal pain: Secondary | ICD-10-CM | POA: Diagnosis not present

## 2022-06-01 DIAGNOSIS — Z87440 Personal history of urinary (tract) infections: Secondary | ICD-10-CM | POA: Insufficient documentation

## 2022-06-01 DIAGNOSIS — R350 Frequency of micturition: Secondary | ICD-10-CM | POA: Insufficient documentation

## 2022-06-01 LAB — POCT URINALYSIS DIPSTICK, ED / UC
Bilirubin Urine: NEGATIVE
Glucose, UA: NEGATIVE mg/dL
Hgb urine dipstick: NEGATIVE
Ketones, ur: NEGATIVE mg/dL
Leukocytes,Ua: NEGATIVE
Nitrite: NEGATIVE
Protein, ur: NEGATIVE mg/dL
Specific Gravity, Urine: 1.02 (ref 1.005–1.030)
Urobilinogen, UA: 0.2 mg/dL (ref 0.0–1.0)
pH: 6.5 (ref 5.0–8.0)

## 2022-06-01 MED ORDER — ONDANSETRON 4 MG PO TBDP
4.0000 mg | ORAL_TABLET | Freq: Three times a day (TID) | ORAL | 0 refills | Status: AC | PRN
Start: 2022-06-01 — End: 2022-06-06

## 2022-06-01 MED ORDER — CEPHALEXIN 500 MG PO CAPS: 500.0000 mg | ORAL_CAPSULE | Freq: Four times a day (QID) | ORAL | 0 refills | Status: AC

## 2022-06-01 NOTE — ED Provider Notes (Signed)
Patient Contact: 7:54 PM (approximate)   History   Nausea, Fatigue, and Generalized Body Aches   HPI  Mikayla Sweeney is a 61 y.o. female presents to the urgent care with increased urinary frequency, nausea, low back pain and suprapubic pain for the past 5 days.  Patient states that she is prone to urinary tract infections and currently feels similar.  No chest pain, chest tightness or abdominal pain.      Physical Exam   Triage Vital Signs: ED Triage Vitals  Enc Vitals Group     BP 06/01/22 1942 (!) 145/86     Pulse Rate 06/01/22 1942 74     Resp 06/01/22 1942 18     Temp 06/01/22 1942 98.2 F (36.8 C)     Temp src --      SpO2 06/01/22 1942 98 %     Weight --      Height --      Head Circumference --      Peak Flow --      Pain Score 06/01/22 1941 7     Pain Loc --      Pain Edu? --      Excl. in GC? --     Most recent vital signs: Vitals:   06/01/22 1942  BP: (!) 145/86  Pulse: 74  Resp: 18  Temp: 98.2 F (36.8 C)  SpO2: 98%     General: Alert and in no acute distress. Eyes:  PERRL. EOMI. Head: No acute traumatic findings ENT:      Nose: No congestion/rhinnorhea.      Mouth/Throat: Mucous membranes are moist. Neck: No stridor. No cervical spine tenderness to palpation. Cardiovascular:  Good peripheral perfusion Respiratory: Normal respiratory effort without tachypnea or retractions. Lungs CTAB. Good air entry to the bases with no decreased or absent breath sounds. Gastrointestinal: Bowel sounds 4 quadrants. Soft and nontender to palpation. No guarding or rigidity. No palpable masses. No distention. No CVA tenderness. Musculoskeletal: Full range of motion to all extremities.  Neurologic:  No gross focal neurologic deficits are appreciated.  Skin:   No rash noted Other:   ED Results / Procedures / Treatments   Labs (all labs ordered are listed, but only abnormal results are displayed) Labs Reviewed  URINE CULTURE  POCT URINALYSIS DIPSTICK, ED  / UC        PROCEDURES:  Critical Care performed: No  Procedures   MEDICATIONS ORDERED IN ED: Medications - No data to display   IMPRESSION / MDM / ASSESSMENT AND PLAN / ED COURSE  I reviewed the triage vital signs and the nursing notes.                              Assessment and plan Nausea Low back pain 61 year old female presents to the urgent care with nausea, increased urinary frequency and low back pain for the past 5 days.  Vital signs are reassuring at triage.  On exam, patient was alert, active and nontoxic-appearing.  Urinalysis is not suggestive of UTI but given symptoms, will treat empirically with Keflex.  Urine culture in process.  Return precautions were given to return with new or worsening symptoms.      FINAL CLINICAL IMPRESSION(S) / ED DIAGNOSES   Final diagnoses:  Nausea     Rx / DC Orders   ED Discharge Orders          Ordered    ondansetron (  ZOFRAN-ODT) 4 MG disintegrating tablet  Every 8 hours PRN        06/01/22 1951    cephALEXin (KEFLEX) 500 MG capsule  4 times daily        06/01/22 1951             Note:  This document was prepared using Dragon voice recognition software and may include unintentional dictation errors.   Pia Mau [place], [place] 06/01/22 1955

## 2022-06-03 LAB — URINE CULTURE: Culture: 50000 — AB

## 2022-06-04 ENCOUNTER — Telehealth: Payer: Self-pay | Admitting: Adult Health

## 2022-06-05 MED ORDER — DOXYCYCLINE HYCLATE 100 MG PO TABS: ORAL_TABLET | ORAL | 0 refills | Status: DC

## 2022-06-05 NOTE — Telephone Encounter (Signed)
Left a message for the pt to return my call.  

## 2022-06-26 NOTE — Telephone Encounter (Signed)
Error. Please disregard

## 2022-06-28 ENCOUNTER — Telehealth: Payer: Self-pay | Admitting: Adult Health

## 2022-06-28 NOTE — Telephone Encounter (Signed)
Left message for patient to call back and schedule Medicare Annual Wellness Visit (AWV) either virtually or in office. Left  my jabber number [phone]   Last AWV ;07/04/21 please schedule at anytime with [facility] Nurse Health Advisor 1 or 2   

## 2022-07-04 ENCOUNTER — Encounter: Payer: Self-pay | Admitting: Adult Health

## 2022-07-04 ENCOUNTER — Ambulatory Visit (INDEPENDENT_AMBULATORY_CARE_PROVIDER_SITE_OTHER): Payer: Medicare Other | Admitting: Adult Health

## 2022-07-04 VITALS — BP 122/82 | HR 76 | Temp 98.1°F | Ht 64.5 in | Wt 169.0 lb

## 2022-07-04 DIAGNOSIS — R350 Frequency of micturition: Secondary | ICD-10-CM

## 2022-07-04 MED ORDER — DOXYCYCLINE HYCLATE 100 MG PO CAPS: 100.0000 mg | ORAL_CAPSULE | Freq: Two times a day (BID) | ORAL | 0 refills | Status: DC

## 2022-07-04 NOTE — Progress Notes (Unsigned)
Subjective:    Patient ID: Mikayla Sweeney, female    DOB: September 13, 1961, 61 y.o.   MRN: [id number]  HPI 61 year old female who  has a past medical history of Anxiety, Asthma, B12 deficiency, Bipolar 1 disorder (HCC), CHF (congestive heart failure) (HCC), Epilepsy (HCC), Hypertension, and Sarcoidosis.  She presents to the office today for concern of UTI. Her symptoms include that of urinary frequency/urgency, nausea, body aches, and abdominal cramping and low back pain. Her symptoms are similar to symptoms she was experiencing at urgent care a month ago. Her urinalysis was negative at this time but due to symptoms she was treated empirically with Keflex.   Keflex was not helping with her symptoms so another provider sent in doxycycline BID x 7 days. She reports that this was helping but she " got side tracked" and stopped taking her antibiotics. Her symptoms again started last week   She denies f/c/v/d   Review of Systems See HPI   Past Medical History:  Diagnosis Date   Anxiety    Asthma    B12 deficiency    Bipolar 1 disorder (HCC)    CHF (congestive heart failure) (HCC)    Epilepsy (HCC)    Hypertension    Sarcoidosis     Social History   Socioeconomic History   Marital status: Married    Spouse name: Not on file   Number of children: Not on file   Years of education: Not on file   Highest education level: Not on file  Occupational History   Not on file  Tobacco Use   Smoking status: Former   Smokeless tobacco: Never  Vaping Use   Vaping Use: Never used  Substance and Sexual Activity   Alcohol use: No   Drug use: No   Sexual activity: Not Currently    Birth control/protection: None  Other Topics Concern   Not on file  Social History Narrative   She has her own business    Married    Three children    5 grandchildren       She likes to spend time with her grandchildren    Social Determinants of Health   Financial Resource Strain: Low Risk  (07/04/2021)    Overall Financial Resource Strain (CARDIA)    Difficulty of Paying Living Expenses: Not hard at all  Food Insecurity: No Food Insecurity (07/04/2021)   Hunger Vital Sign    Worried About Running Out of Food in the Last Year: Never true    Ran Out of Food in the Last Year: Never true  Transportation Needs: No Transportation Needs (07/04/2021)   PRAPARE - Administrator, Civil Service (Medical): No    Lack of Transportation (Non-Medical): No  Physical Activity: Inactive (07/04/2021)   Exercise Vital Sign    Days of Exercise per Week: 0 days    Minutes of Exercise per Session: 0 min  Stress: No Stress Concern Present (07/04/2021)   [employer] of Occupational Health - Occupational Stress Questionnaire    Feeling of Stress : Not at all  Social Connections: Socially Isolated (07/04/2021)   Social Connection and Isolation Panel [NHANES]    Frequency of Communication with Friends and Family: Three times a week    Frequency of Social Gatherings with Friends and Family: Three times a week    Attends Religious Services: Never    Active Member of Clubs or Organizations: No    Attends Banker Meetings: Never  Marital Status: Divorced  Human resources officer Violence: Not At Risk (07/04/2021)   Humiliation, Afraid, Rape, and Kick questionnaire    Fear of Current or Ex-Partner: No    Emotionally Abused: No    Physically Abused: No    Sexually Abused: No    Past Surgical History:  Procedure Laterality Date   TONSILLECTOMY     TUBAL LIGATION      Family History  Problem Relation Age of Onset   Pancreatic cancer Mother    Hypertension Mother    Diabetes Mother    Heart disease Father    Gout Father    Cirrhosis Father    Heart disease Sister        CHF    No Known Allergies  Current Outpatient Medications on File Prior to Visit  Medication Sig Dispense Refill   albuterol (PROVENTIL HFA;VENTOLIN HFA) 108 (90 Base) MCG/ACT inhaler Inhale 1-2 puffs into the  lungs every 6 (six) hours as needed. For wheeze or shortness of breath 1 Inhaler 0   amLODipine (NORVASC) 10 MG tablet Take 1 tablet (10 mg total) by mouth daily. 90 tablet 3   carvedilol (COREG) 25 MG tablet TAKE 1 TABLET BY MOUTH TWICE A DAY WITH MEALS 180 tablet 1   doxycycline (VIBRA-TABS) 100 MG tablet Take 1 tablet (100 mg total) by mouth 2 (two) times daily for 7 days 14 tablet 0   fluticasone (FLONASE) 50 MCG/ACT nasal spray Place 2 sprays into both nostrils daily. 1 g 0   furosemide (LASIX) 20 MG tablet Take 1 tablet (20 mg total) by mouth daily. 90 tablet 3   hydrochlorothiazide (HYDRODIURIL) 25 MG tablet Take 1 tablet (25 mg total) by mouth daily. 90 tablet 3   ibuprofen (ADVIL) 800 MG tablet Take 800 mg by mouth every 6 (six) hours as needed.     ibuprofen (ADVIL,MOTRIN) 600 MG tablet Take 1 tablet (600 mg total) by mouth every 8 (eight) hours as needed. 30 tablet 0   losartan (COZAAR) 100 MG tablet Take 1 tablet (100 mg total) by mouth daily. 90 tablet 3   omeprazole (PRILOSEC) 20 MG capsule TAKE 1 CAPSULE BY MOUTH EVERY DAY 90 capsule 0   rosuvastatin (CRESTOR) 40 MG tablet Take 1 tablet (40 mg total) by mouth daily. 90 tablet 3   traZODone (DESYREL) 50 MG tablet TAKE 1 TABLET BY MOUTH AT BEDTIME AS NEEDED FOR SLEEP 90 tablet 0   No current facility-administered medications on file prior to visit.    BP 140/90   Pulse 76   Temp 98.1 F (36.7 C) (Oral)   Ht 5' 4.5" (1.638 m)   LMP 12/10/2014   SpO2 97%   BMI 33.46 kg/m       Objective:   Physical Exam Vitals and nursing note reviewed.  Constitutional:      Appearance: Normal appearance.  Cardiovascular:     Rate and Rhythm: Normal rate and regular rhythm.     Pulses: Normal pulses.     Heart sounds: Normal heart sounds.  Pulmonary:     Effort: Pulmonary effort is normal.     Breath sounds: Normal breath sounds.  Abdominal:     Tenderness: There is abdominal tenderness in the periumbilical area and suprapubic  area. There is right CVA tenderness.  Skin:    General: Skin is warm and dry.  Neurological:     Mental Status: She is alert.       Assessment & Plan:  1. Frequent urination  -  POC Urinalysis Dipstick- negative but will treat due to symptoms. She was reminded on importance of finishing her complete antibiotic course  - doxycycline (VIBRAMYCIN) 100 MG capsule; Take 1 capsule (100 mg total) by mouth 2 (two) times daily.  Dispense: 14 capsule; Refill: 0 - Culture, Urine; Future - Culture, Urine

## 2022-07-05 ENCOUNTER — Telehealth: Payer: Self-pay | Admitting: Adult Health

## 2022-07-05 ENCOUNTER — Telehealth: Payer: Self-pay

## 2022-07-05 LAB — POCT URINALYSIS DIPSTICK
Bilirubin, UA: NEGATIVE
Blood, UA: NEGATIVE
Glucose, UA: NEGATIVE
Ketones, UA: NEGATIVE
Leukocytes, UA: NEGATIVE
Nitrite, UA: NEGATIVE
Protein, UA: POSITIVE — AB
Spec Grav, UA: >=1.03 — AB (ref 1.010–1.025)
Urobilinogen, UA: 0.2 E.U./dL
pH, UA: 5.5 (ref 5.0–8.0)

## 2022-07-05 NOTE — Telephone Encounter (Signed)
Pt notified that results are not in yet. Pt advised that we will call her as soon as they come in and [place] results them. Pt verbalized understanding.

## 2022-07-05 NOTE — Telephone Encounter (Addendum)
Unsuccessful attempt to reach patient on preferred number listed in notes for scheduled AWV. Left message on voicemail ok to reschedule. 

## 2022-07-05 NOTE — Telephone Encounter (Signed)
Pt is calling and would like her urine result 

## 2022-07-06 LAB — URINE CULTURE
MICRO NUMBER:: 13697315
Result:: NO GROWTH
SPECIMEN QUALITY:: ADEQUATE

## 2022-07-26 ENCOUNTER — Ambulatory Visit (INDEPENDENT_AMBULATORY_CARE_PROVIDER_SITE_OTHER): Payer: Medicare Other

## 2022-07-26 VITALS — Ht 63.0 in | Wt 169.0 lb

## 2022-07-26 DIAGNOSIS — Z0000 Encounter for general adult medical examination without abnormal findings: Secondary | ICD-10-CM | POA: Diagnosis not present

## 2022-07-26 DIAGNOSIS — Z1211 Encounter for screening for malignant neoplasm of colon: Secondary | ICD-10-CM | POA: Diagnosis not present

## 2022-07-26 DIAGNOSIS — Z1231 Encounter for screening mammogram for malignant neoplasm of breast: Secondary | ICD-10-CM

## 2022-07-26 NOTE — Patient Instructions (Addendum)
Mikayla Sweeney , Thank you for taking time to come for your Medicare Wellness Visit. I appreciate your ongoing commitment to your health goals. Please review the following plan we discussed and let me know if I can assist you in the future.   These are the goals we discussed:  Goals       Lose weight (pt-stated)      I want to continue weight loss and get off B/P meds      Weight Loss Achieved      Evidence-based guidance:  Review medication that may contribute to weight gain, such as corticosteroid, beta-blocker, tricyclic antidepressant, oral antihyperglycemic; advocate for changes when appropriate.  Perform or refer to registered dietitian to perform comprehensive nutrition assessment that includes disordered-eating behaviors, such as binge-eating, emotional or compulsive eating, grazing.  Counsel patient regarding health risks of obesity and that weight loss goal of 5 to 10 percent of initial weight will improve risk.  Recommend initial weight loss goal of 3 to 5 percent of bodyweight; increase weight-loss goals based on patient success as achieving greater weight loss continues to reduce risk.  Propose a calorie-reduced diet based on the patient's preferences and health status.  Provide ongoing emotional support or cognitive behavioral therapy and dietitian services (individual, group, virtual) over at least 6 months with a minimum of 14 encounters to best facilitate weight loss.  Provide monthly follow-up for 12 months when weight loss goal is met to assist with maintenance of weight loss.  Encourage increased physical activity or exercise based on individual age, risk, and ability up to 200 to 300 minutes per week that includes aerobic and resistance training.  Encourage reduction in sedentary behaviors by replacing them with nonexercise yet active leisure pursuits.  Identify physical barriers, such as change in posture, balance, gait patterns, joint pain, and environmental barriers to  activity.  Consider referral to rehabilitation therapy, especially when mobility or function is impaired due to osteoarthritis and obesity.  Consider referral to weight-loss program that has published evidence of safety and efficacy if on-site intensive intervention is unavailable or patient preference.  Prepare patient for use of pharmacologic therapy as an adjunct to lifestyle changes based on body mass index, patient agreement and presence of risk factors or comorbidities.  Evaluate efficacy of pharmacologic therapy (weight loss) and tolerance to medication periodically.  Engage in shared decision-making regarding referral to bariatric surgeon for consultation and evaluation when weight-loss goal has not been accomplished by behavioral therapy with or without pharmacologic therapy.   Notes:         This is a list of the screening recommended for you and due dates:  Health Maintenance  Topic Date Due   Mammogram  Never done   Pap Smear  12/19/2014   Colon Cancer Screening  05/10/2022   Flu Shot  07/10/2022   Zoster (Shingles) Vaccine (1 of 2) 10/26/2022*   Tetanus Vaccine  02/05/2023   COVID-19 Vaccine  Completed   Hepatitis C Screening: USPSTF Recommendation to screen - Ages 18-79 yo.  Completed   HIV Screening  Completed   HPV Vaccine  Aged Out  *Topic was postponed. The date shown is not the original due date.  i Advanced directives: No  Conditions/risks identified: None  Next appointment: Follow up in one year for your annual wellness visit     Preventive Care 65 Years and Older, Female Preventive care refers to lifestyle choices and visits with your health care provider that can promote health and wellness.  What does preventive care include? A yearly physical exam. This is also called an annual well check. Dental exams once or twice a year. Routine eye exams. Ask your health care provider how often you should have your eyes checked. Personal lifestyle choices,  including: Daily care of your teeth and gums. Regular physical activity. Eating a healthy diet. Avoiding tobacco and drug use. Limiting alcohol use. Practicing safe sex. Taking low-dose aspirin every day. Taking vitamin and mineral supplements as recommended by your health care provider. What happens during an annual well check? The services and screenings done by your health care provider during your annual well check will depend on your age, overall health, lifestyle risk factors, and family history of disease. Counseling  Your health care provider may ask you questions about your: Alcohol use. Tobacco use. Drug use. Emotional well-being. Home and relationship well-being. Sexual activity. Eating habits. History of falls. Memory and ability to understand (cognition). Work and work Statistician. Reproductive health. Screening  You may have the following tests or measurements: Height, weight, and BMI. Blood pressure. Lipid and cholesterol levels. These may be checked every 5 years, or more frequently if you are over 32 years old. Skin check. Lung cancer screening. You may have this screening every year starting at age 52 if you have a 30-pack-year history of smoking and currently smoke or have quit within the past 15 years. Fecal occult blood test (FOBT) of the stool. You may have this test every year starting at age 17. Flexible sigmoidoscopy or colonoscopy. You may have a sigmoidoscopy every 5 years or a colonoscopy every 10 years starting at age 35. Hepatitis C blood test. Hepatitis B blood test. Sexually transmitted disease (STD) testing. Diabetes screening. This is done by checking your blood sugar (glucose) after you have not eaten for a while (fasting). You may have this done every 1-3 years. Bone density scan. This is done to screen for osteoporosis. You may have this done starting at age 76. Mammogram. This may be done every 1-2 years. Talk to your health care provider  about how often you should have regular mammograms. Talk with your health care provider about your test results, treatment options, and if necessary, the need for more tests. Vaccines  Your health care provider may recommend certain vaccines, such as: Influenza vaccine. This is recommended every year. Tetanus, diphtheria, and acellular pertussis (Tdap, Td) vaccine. You may need a Td booster every 10 years. Zoster vaccine. You may need this after age 64. Pneumococcal 13-valent conjugate (PCV13) vaccine. One dose is recommended after age 33. Pneumococcal polysaccharide (PPSV23) vaccine. One dose is recommended after age 44. Talk to your health care provider about which screenings and vaccines you need and how often you need them. This information is not intended to replace advice given to you by your health care provider. Make sure you discuss any questions you have with your health care provider. Document Released: 12/23/2015 Document Revised: 08/15/2016 Document Reviewed: 09/27/2015 Elsevier Interactive Patient Education  2017 [employer] Prevention in the Home Falls can cause injuries. They can happen to people of all ages. There are many things you can do to make your home safe and to help prevent falls. What can I do on the outside of my home? Regularly fix the edges of walkways and driveways and fix any cracks. Remove anything that might make you trip as you walk through a door, such as a raised step or threshold. Trim any bushes or trees on the  path to your home. Use bright outdoor lighting. Clear any walking paths of anything that might make someone trip, such as rocks or tools. Regularly check to see if handrails are loose or broken. Make sure that both sides of any steps have handrails. Any raised decks and porches should have guardrails on the edges. Have any leaves, snow, or ice cleared regularly. Use sand or salt on walking paths during winter. Clean up any spills in  your garage right away. This includes oil or grease spills. What can I do in the bathroom? Use night lights. Install grab bars by the toilet and in the tub and shower. Do not use towel bars as grab bars. Use non-skid mats or decals in the tub or shower. If you need to sit down in the shower, use a plastic, non-slip stool. Keep the floor dry. Clean up any water that spills on the floor as soon as it happens. Remove soap buildup in the tub or shower regularly. Attach bath mats securely with double-sided non-slip rug tape. Do not have throw rugs and other things on the floor that can make you trip. What can I do in the bedroom? Use night lights. Make sure that you have a light by your bed that is easy to reach. Do not use any sheets or blankets that are too big for your bed. They should not hang down onto the floor. Have a firm chair that has side arms. You can use this for support while you get dressed. Do not have throw rugs and other things on the floor that can make you trip. What can I do in the kitchen? Clean up any spills right away. Avoid walking on wet floors. Keep items that you use a lot in easy-to-reach places. If you need to reach something above you, use a strong step stool that has a grab bar. Keep electrical cords out of the way. Do not use floor polish or wax that makes floors slippery. If you must use wax, use non-skid floor wax. Do not have throw rugs and other things on the floor that can make you trip. What can I do with my stairs? Do not leave any items on the stairs. Make sure that there are handrails on both sides of the stairs and use them. Fix handrails that are broken or loose. Make sure that handrails are as long as the stairways. Check any carpeting to make sure that it is firmly attached to the stairs. Fix any carpet that is loose or worn. Avoid having throw rugs at the top or bottom of the stairs. If you do have throw rugs, attach them to the floor with carpet  tape. Make sure that you have a light switch at the top of the stairs and the bottom of the stairs. If you do not have them, ask someone to add them for you. What else can I do to help prevent falls? Wear shoes that: Do not have high heels. Have rubber bottoms. Are comfortable and fit you well. Are closed at the toe. Do not wear sandals. If you use a stepladder: Make sure that it is fully opened. Do not climb a closed stepladder. Make sure that both sides of the stepladder are locked into place. Ask someone to hold it for you, if possible. Clearly mark and make sure that you can see: Any grab bars or handrails. First and last steps. Where the edge of each step is. Use tools that help you move around (mobility  aids) if they are needed. These include: Canes. Walkers. Scooters. Crutches. Turn on the lights when you go into a dark area. Replace any light bulbs as soon as they burn out. Set up your furniture so you have a clear path. Avoid moving your furniture around. If any of your floors are uneven, fix them. If there are any pets around you, be aware of where they are. Review your medicines with your doctor. Some medicines can make you feel dizzy. This can increase your chance of falling. Ask your doctor what other things that you can do to help prevent falls. This information is not intended to replace advice given to you by your health care provider. Make sure you discuss any questions you have with your health care provider. Document Released: 09/22/2009 Document Revised: 05/03/2016 Document Reviewed: 12/31/2014 Elsevier Interactive Patient Education  2017 [employer].

## 2022-07-26 NOTE — Progress Notes (Signed)
Subjective:   Mikayla Sweeney is a 61 y.o. female who presents for Medicare Annual (Subsequent) preventive examination.  Review of Systems    Virtual Visit via Telephone Note  I connected with  Mikayla Sweeney on 07/26/22 at  3:00 PM EDT by telephone and verified that I am speaking with the correct person using two identifiers.  Location: Patient: Home Provider: Office Persons participating in the virtual visit: patient/Nurse Health Advisor   I discussed the limitations, risks, security and privacy concerns of performing an evaluation and management service by telephone and the availability of in person appointments. The patient expressed understanding and agreed to proceed.  Interactive audio and video telecommunications were attempted between this nurse and patient, however failed, due to patient having technical difficulties OR patient did not have access to video capability.  We continued and completed visit with audio only.  Some vital signs may be absent or patient reported.   [name], LPN  Cardiac Risk Factors include: advanced age (>52men, >35 women);hypertension     Objective:    Today's Vitals   07/26/22 1455  Weight: 169 lb (76.7 kg)  Height: 5\' 3"  (1.6 m)   Body mass index is 29.94 kg/m.     07/26/2022    3:05 PM 07/04/2021   11:19 AM 04/02/2017   12:10 AM 12/24/2014    9:02 PM 10/10/2014   12:31 PM 07/26/2014    9:40 AM  Advanced Directives  Does Patient Have a Medical Advance Directive? No No No No No No  Would patient like information on creating a medical advance directive? No - Patient declined    No - patient declined information Yes - Educational materials given    Current Medications (verified) Outpatient Encounter Medications as of 07/26/2022  Medication Sig   albuterol (PROVENTIL HFA;VENTOLIN HFA) 108 (90 Base) MCG/ACT inhaler Inhale 1-2 puffs into the lungs every 6 (six) hours as needed. For wheeze or shortness of breath   amLODipine  (NORVASC) 10 MG tablet Take 1 tablet (10 mg total) by mouth daily.   carvedilol (COREG) 25 MG tablet TAKE 1 TABLET BY MOUTH TWICE A DAY WITH MEALS   doxycycline (VIBRAMYCIN) 100 MG capsule Take 1 capsule (100 mg total) by mouth 2 (two) times daily.   fluticasone (FLONASE) 50 MCG/ACT nasal spray Place 2 sprays into both nostrils daily.   furosemide (LASIX) 20 MG tablet Take 1 tablet (20 mg total) by mouth daily.   hydrochlorothiazide (HYDRODIURIL) 25 MG tablet Take 1 tablet (25 mg total) by mouth daily.   ibuprofen (ADVIL) 800 MG tablet Take 800 mg by mouth every 6 (six) hours as needed.   ibuprofen (ADVIL,MOTRIN) 600 MG tablet Take 1 tablet (600 mg total) by mouth every 8 (eight) hours as needed.   losartan (COZAAR) 100 MG tablet Take 1 tablet (100 mg total) by mouth daily.   omeprazole (PRILOSEC) 20 MG capsule TAKE 1 CAPSULE BY MOUTH EVERY DAY   rosuvastatin (CRESTOR) 40 MG tablet Take 1 tablet (40 mg total) by mouth daily.   traZODone (DESYREL) 50 MG tablet TAKE 1 TABLET BY MOUTH AT BEDTIME AS NEEDED FOR SLEEP   No facility-administered encounter medications on file as of 07/26/2022.    Allergies (verified) Patient has no known allergies.   History: Past Medical History:  Diagnosis Date   Anxiety    Asthma    B12 deficiency    Bipolar 1 disorder (HCC)    CHF (congestive heart failure) (HCC)  Epilepsy (HCC)    Hypertension    Sarcoidosis    Past Surgical History:  Procedure Laterality Date   TONSILLECTOMY     TUBAL LIGATION     Family History  Problem Relation Age of Onset   Pancreatic cancer Mother    Hypertension Mother    Diabetes Mother    Heart disease Father    Gout Father    Cirrhosis Father    Heart disease Sister        CHF   Social History   Socioeconomic History   Marital status: Married    Spouse name: Not on file   Number of children: Not on file   Years of education: Not on file   Highest education level: Not on file  Occupational History   Not  on file  Tobacco Use   Smoking status: Former   Smokeless tobacco: Never  Vaping Use   Vaping Use: Never used  Substance and Sexual Activity   Alcohol use: No   Drug use: No   Sexual activity: Not Currently    Birth control/protection: None  Other Topics Concern   Not on file  Social History Narrative   She has her own business    Married    Three children    5 grandchildren       She likes to spend time with her grandchildren    Social Determinants of Health   Financial Resource Strain: Low Risk  (07/26/2022)   Overall Financial Resource Strain (CARDIA)    Difficulty of Paying Living Expenses: Not hard at all  Food Insecurity: No Food Insecurity (07/26/2022)   Hunger Vital Sign    Worried About Running Out of Food in the Last Year: Never true    Ran Out of Food in the Last Year: Never true  Transportation Needs: No Transportation Needs (07/26/2022)   PRAPARE - Administrator, Civil Service (Medical): No    Lack of Transportation (Non-Medical): No  Physical Activity: Sufficiently Active (07/26/2022)   Exercise Vital Sign    Days of Exercise per Week: 5 days    Minutes of Exercise per Session: 40 min  Stress: No Stress Concern Present (07/26/2022)   [employer] of Occupational Health - Occupational Stress Questionnaire    Feeling of Stress : Not at all  Social Connections: Moderately Isolated (07/26/2022)   Social Connection and Isolation Panel [NHANES]    Frequency of Communication with Friends and Family: More than three times a week    Frequency of Social Gatherings with Friends and Family: More than three times a week    Attends Religious Services: Never    Database administrator or Organizations: Yes    Attends Banker Meetings: Never    Marital Status: Divorced    Tobacco Counseling Counseling given: Not Answered   Clinical Intake:  Pre-visit preparation completed: No  Pain : No/denies pain     BMI - recorded:  28.57 Nutritional Status: BMI 25 -29 Overweight Nutritional Risks: None  How often do you need to have someone help you when you read instructions, pamphlets, or other written materials from your doctor or pharmacy?: 1 - Never  Diabetic? No  Interpreter Needed?: No  Information entered by :: Theresa Mulligan LPN   Activities of Daily Living    07/26/2022    3:03 PM  In your present state of health, do you have any difficulty performing the following activities:  Hearing? 0  Vision? 0  Difficulty concentrating or making decisions? 0  Walking or climbing stairs? 0  Dressing or bathing? 0  Doing errands, shopping? 0  Preparing Food and eating ? N  Using the Toilet? N  In the past six months, have you accidently leaked urine? N  Do you have problems with loss of bowel control? N  Managing your Medications? N  Managing your Finances? N  Housekeeping or managing your Housekeeping? N    Patient Care Team: Shirline Frees, NP as PCP - General (Family Medicine)  Indicate any recent Medical Services you may have received from other than [facility] providers in the past year (date may be approximate).     Assessment:   This is a routine wellness examination for Shannon.  Hearing/Vision screen Hearing Screening - Comments:: No hearing difficulty Vision Screening - Comments:: Wears glasses. Followed by Dr Shea Evans  Dietary issues and exercise activities discussed: Exercise limited by: None identified   Goals Addressed               This Visit's Progress     Lose weight (pt-stated)        I want to continue weight loss and get off B/P meds       Depression Screen    07/26/2022    3:01 PM 07/04/2021   11:20 AM 07/04/2021   11:18 AM 06/17/2020    9:40 AM  PHQ 2/9 Scores  PHQ - 2 Score 0 0 0 0    Fall Risk    07/26/2022    3:04 PM 07/04/2021   11:20 AM 06/17/2020    9:40 AM  Fall Risk   Falls in the past year? 0 0 0  Number falls in past yr: 0 0   Injury with Fall? 0 0   Risk  for fall due to : No Fall Risks    Follow up  Falls evaluation completed     FALL RISK PREVENTION PERTAINING TO THE HOME:  Any stairs in or around the home? Yes  If so, are there any without handrails? No  Home free of loose throw rugs in walkways, pet beds, electrical cords, etc? Yes  Adequate lighting in your home to reduce risk of falls? Yes   ASSISTIVE DEVICES UTILIZED TO PREVENT FALLS:  Life alert? No  Use of a cane, walker or w/c? No  Grab bars in the bathroom? No  Shower chair or bench in shower? No  Elevated toilet seat or a handicapped toilet? No   TIMED UP AND GO:  Was the test performed? No . Audio Visit   Cognitive Function:        07/26/2022    3:05 PM  6CIT Screen  What Year? 0 points  What month? 0 points  What time? 0 points  Count back from 20 0 points  Months in reverse 0 points  Repeat phrase 0 points  Total Score 0 points    Immunizations Immunization History  Administered Date(s) Administered   Influenza,inj,Quad PF,6+ Mos 10/16/2017, 12/24/2018, 08/31/2021   PFIZER(Purple Top)SARS-COV-2 Vaccination 03/24/2020, 04/18/2020   Pfizer Covid-19 Vaccine Bivalent Booster 53yrs & up 12/18/2021   Tdap 02/05/2013    TDAP status: Up to date  Flu Vaccine status: Up to date    Covid-19 vaccine status: Completed vaccines  Qualifies for Shingles Vaccine? Yes   Zostavax completed No   Shingrix Completed?: No.    Education has been provided regarding the importance of this vaccine. Patient has been advised to call insurance company to  determine out of pocket expense if they have not yet received this vaccine. Advised may also receive vaccine at local pharmacy or Health Dept. Verbalized acceptance and understanding.  Screening Tests Health Maintenance  Topic Date Due   MAMMOGRAM  Never done   PAP SMEAR-Modifier  12/19/2014   COLONOSCOPY (Pts 45-18yrs Insurance coverage will need to be confirmed)  05/10/2022   INFLUENZA VACCINE  07/10/2022   Zoster  Vaccines- Shingrix (1 of 2) 10/26/2022 (Originally 04/21/2011)   TETANUS/TDAP  02/05/2023   COVID-19 Vaccine  Completed   Hepatitis C Screening  Completed   HIV Screening  Completed   HPV VACCINES  Aged Out    Health Maintenance  Health Maintenance Due  Topic Date Due   MAMMOGRAM  Never done   PAP SMEAR-Modifier  12/19/2014   COLONOSCOPY (Pts 45-10yrs Insurance coverage will need to be confirmed)  05/10/2022   INFLUENZA VACCINE  07/10/2022    Colorectal cancer screening: Referral to GI placed 07/26/22. Pt aware the office will call re: appt.  Mammogram status: Ordered 07/26/22. Pt provided with contact info and advised to call to schedule appt.     Lung Cancer Screening: (Low Dose CT Chest recommended if Age 80-80 years, 30 pack-year currently smoking OR have quit w/in 15years.) does not qualify.     Additional Screening:  Hepatitis C Screening: does qualify; Completed 12/24/18  Vision Screening: Recommended annual ophthalmology exams for early detection of glaucoma and other disorders of the eye. Is the patient up to date with their annual eye exam?  Yes  Who is the provider or what is the name of the office in which the patient attends annual eye exams? Dr Shea Evans If pt is not established with a provider, would they like to be referred to a provider to establish care? No .   Dental Screening: Recommended annual dental exams for proper oral hygiene  Community Resource Referral / Chronic Care Management:  CRR required this visit?  No   CCM required this visit?  No      Plan:     I have personally reviewed and noted the following in the patient's chart:   Medical and social history Use of alcohol, tobacco or illicit drugs  Current medications and supplements including opioid prescriptions.  Functional ability and status Nutritional status Physical activity Advanced directives List of other physicians Hospitalizations, surgeries, and ER visits in previous 12  months Vitals Screenings to include cognitive, depression, and falls Referrals and appointments  In addition, I have reviewed and discussed with patient certain preventive protocols, quality metrics, and best practice recommendations. A written personalized care plan for preventive services as well as general preventive health recommendations were provided to patient.     [name], LPN   03/18/8118   Nurse Notes: None

## 2022-08-15 ENCOUNTER — Other Ambulatory Visit: Payer: Self-pay | Admitting: Adult Health

## 2022-08-15 DIAGNOSIS — I5031 Acute diastolic (congestive) heart failure: Secondary | ICD-10-CM

## 2022-08-15 DIAGNOSIS — I10 Essential (primary) hypertension: Secondary | ICD-10-CM

## 2022-08-25 ENCOUNTER — Ambulatory Visit (HOSPITAL_COMMUNITY)
Admission: EM | Admit: 2022-08-25 | Discharge: 2022-08-25 | Disposition: A | Discharge status: Home / Self Care | Payer: Medicare Other | Attending: Emergency Medicine | Admitting: Emergency Medicine

## 2022-08-25 ENCOUNTER — Encounter (HOSPITAL_COMMUNITY): Payer: Self-pay | Admitting: Emergency Medicine

## 2022-08-25 DIAGNOSIS — R350 Frequency of micturition: Secondary | ICD-10-CM | POA: Diagnosis not present

## 2022-08-25 LAB — POCT URINALYSIS DIPSTICK, ED / UC
Bilirubin Urine: NEGATIVE
Glucose, UA: NEGATIVE mg/dL
Hgb urine dipstick: NEGATIVE
Leukocytes,Ua: NEGATIVE
Nitrite: NEGATIVE
Protein, ur: NEGATIVE mg/dL
Specific Gravity, Urine: >=1.03 (ref 1.005–1.030)
Urobilinogen, UA: 0.2 mg/dL (ref 0.0–1.0)
pH: 5.5 (ref 5.0–8.0)

## 2022-08-25 MED ORDER — DOXYCYCLINE HYCLATE 100 MG PO CAPS: 100.0000 mg | ORAL_CAPSULE | Freq: Two times a day (BID) | ORAL | 0 refills | Status: DC

## 2022-08-25 NOTE — Discharge Instructions (Signed)
Your urinalysis negative for signs of infection but as you are symptomatic we will send it to the lab to determine if bacteria will grow, you will be notified if any changes need to be made to your medication  Upon review of your chart it is quite common that when you are having the symptoms that your urinalysis is negative and then there is also no bacteria growth in your culture meaning that you do not actually have an infection  Therefore I would like you to follow-up with urology for further evaluations of your reoccurring urinary symptoms, information is listed on your front page  Begin use of doxycycline every morning and every evening for 7 days, we are using this medicine as it help to clear your symptoms on your last visit your doctor  You may use over-the-counter Azo to help minimize your symptoms until antibiotic removes bacteria, this medication will turn your urine orange  Increase your fluid intake through use of water  As always practice good hygiene, wiping front to back and avoidance of scented vaginal products to prevent further irritation  If symptoms continue to persist after use of medication or recur please follow-up with urgent care or your primary doctor as needed

## 2022-08-25 NOTE — ED Triage Notes (Signed)
Pt c/o urinary frequency, nausea and headache for this past week. Pt feels has UTI. Denies dysuria. Took 2 antibiotic pills yesterday that found at house.

## 2022-08-25 NOTE — ED Provider Notes (Signed)
[facility]    CSN: [id number] Arrival date & time: 08/25/22  1420      History   Chief Complaint Chief Complaint  Patient presents with   Urinary Frequency    HPI Mikayla Sweeney is a 61 y.o. female.   Took 2 doxycyline tablets,  Urinary.    Past Medical History:  Diagnosis Date   Anxiety    Asthma    B12 deficiency    Bipolar 1 disorder (HCC)    CHF (congestive heart failure) (HCC)    Epilepsy (HCC)    Hypertension    Sarcoidosis     Patient Active Problem List   Diagnosis Date Noted   Acute diastolic heart failure (HCC) 07/27/2014   Pulmonary edema 07/26/2014   Essential hypertension 07/26/2014   Sarcoidosis 07/26/2014   Bipolar 1 disorder (HCC) 07/26/2014   Headache 07/26/2014    Past Surgical History:  Procedure Laterality Date   TONSILLECTOMY     TUBAL LIGATION      OB History   No obstetric history on file.      Home Medications    Prior to Admission medications   Medication Sig Start Date End Date Taking? Authorizing Provider  albuterol (PROVENTIL HFA;VENTOLIN HFA) 108 (90 Base) MCG/ACT inhaler Inhale 1-2 puffs into the lungs every 6 (six) hours as needed. For wheeze or shortness of breath 12/14/18   Cathie Hoops, Amy V, PA-C  amLODipine (NORVASC) 10 MG tablet Take 1 tablet (10 mg total) by mouth daily. 08/31/21   [name], [name], NP  carvedilol (COREG) 25 MG tablet TAKE 1 TABLET BY MOUTH TWICE A DAY WITH MEALS 08/16/22   [name], [name], NP  doxycycline (VIBRAMYCIN) 100 MG capsule Take 1 capsule (100 mg total) by mouth 2 (two) times daily. 07/04/22   [name], [name], NP  fluticasone (FLONASE) 50 MCG/ACT nasal spray Place 2 sprays into both nostrils daily. 12/14/18   Cathie Hoops, Amy V, PA-C  furosemide (LASIX) 20 MG tablet Take 1 tablet (20 mg total) by mouth daily. 08/31/21   [name], [name], NP  hydrochlorothiazide (HYDRODIURIL) 25 MG tablet Take 1 tablet (25 mg total) by mouth daily. 08/31/21   [name], [name], NP  ibuprofen (ADVIL) 800 MG tablet Take 800 mg by  mouth every 6 (six) hours as needed. 08/09/21   [provider]  ibuprofen (ADVIL,MOTRIN) 600 MG tablet Take 1 tablet (600 mg total) by mouth every 8 (eight) hours as needed. 09/02/18   [name], [name], NP  losartan (COZAAR) 100 MG tablet Take 1 tablet (100 mg total) by mouth daily. 08/31/21 11/29/21  [name], [name], NP  omeprazole (PRILOSEC) 20 MG capsule TAKE 1 CAPSULE BY MOUTH EVERY DAY 05/25/22   [name], [name], NP  rosuvastatin (CRESTOR) 40 MG tablet Take 1 tablet (40 mg total) by mouth daily. 09/01/21   [name], [name], NP  traZODone (DESYREL) 50 MG tablet TAKE 1 TABLET BY MOUTH AT BEDTIME AS NEEDED FOR SLEEP 05/22/22   [name], [name], NP    Family History Family History  Problem Relation Age of Onset   Pancreatic cancer Mother    Hypertension Mother    Diabetes Mother    Heart disease Father    Gout Father    Cirrhosis Father    Heart disease Sister        CHF    Social History Social History   Tobacco Use   Smoking status: Former   Smokeless tobacco: Never  Building services engineer Use: Never used  Substance Use Topics  Alcohol use: No   Drug use: No     Allergies   Patient has no known allergies.   Review of Systems Review of Systems  Cardiovascular: Negative.   Gastrointestinal: Negative.   Genitourinary:  Positive for difficulty urinating, frequency and pelvic pain. Negative for decreased urine volume, dyspareunia, dysuria, enuresis, flank pain, genital sores, hematuria, menstrual problem, urgency, vaginal bleeding, vaginal discharge and vaginal pain.  Skin: Negative.      Physical Exam Triage Vital Signs ED Triage Vitals  Enc Vitals Group     BP 08/25/22 1514 (!) 166/94     Pulse Rate 08/25/22 1514 64     Resp 08/25/22 1514 17     Temp 08/25/22 1514 98.4 F (36.9 C)     Temp src --      SpO2 08/25/22 1514 99 %     Weight --      Height --      Head Circumference --      Peak Flow --      Pain Score 08/25/22 1513 7     Pain Loc --      Pain  Edu? --      Excl. in [place]? --    No data found.  Updated Vital Signs BP (!) 166/94 (BP Location: Left Arm)   Pulse 64   Temp 98.4 F (36.9 C)   Resp 17   LMP 12/10/2014   SpO2 99%   Visual Acuity Right Eye Distance:   Left Eye Distance:   Bilateral Distance:    Right Eye Near:   Left Eye Near:    Bilateral Near:     Physical Exam   UC Treatments / Results  Labs (all labs ordered are listed, but only abnormal results are displayed) Labs Reviewed  POCT URINALYSIS DIPSTICK, ED / UC - Abnormal; Notable for the following components:      Result Value   Ketones, ur TRACE (*)    All other components within normal limits    EKG   Radiology No results found.  Procedures Procedures (including critical care time)  Medications Ordered in UC Medications - No data to display  Initial Impression / Assessment and Plan / UC Course  I have reviewed the triage vital signs and the nursing notes.  Pertinent labs & imaging results that were available during my care of the patient were reviewed by me and considered in my medical decision making (see chart for details).     *** Final Clinical Impressions(s) / UC Diagnoses   Final diagnoses:  None   Discharge Instructions   None    ED Prescriptions   None    PDMP not reviewed this encounter.

## 2022-08-26 LAB — URINE CULTURE: Culture: <10000 — AB

## 2022-09-04 ENCOUNTER — Ambulatory Visit (INDEPENDENT_AMBULATORY_CARE_PROVIDER_SITE_OTHER): Payer: Medicare Other | Admitting: Adult Health

## 2022-09-04 VITALS — BP 122/86 | HR 66 | Temp 98.1°F | Ht 63.75 in | Wt 175.6 lb

## 2022-09-04 DIAGNOSIS — K2100 Gastro-esophageal reflux disease with esophagitis, without bleeding: Secondary | ICD-10-CM | POA: Diagnosis not present

## 2022-09-04 DIAGNOSIS — Z23 Encounter for immunization: Secondary | ICD-10-CM | POA: Diagnosis not present

## 2022-09-04 DIAGNOSIS — E782 Mixed hyperlipidemia: Secondary | ICD-10-CM

## 2022-09-04 DIAGNOSIS — Z0000 Encounter for general adult medical examination without abnormal findings: Secondary | ICD-10-CM

## 2022-09-04 DIAGNOSIS — I10 Essential (primary) hypertension: Secondary | ICD-10-CM

## 2022-09-04 DIAGNOSIS — I5032 Chronic diastolic (congestive) heart failure: Secondary | ICD-10-CM | POA: Diagnosis not present

## 2022-09-04 DIAGNOSIS — Z1211 Encounter for screening for malignant neoplasm of colon: Secondary | ICD-10-CM

## 2022-09-04 DIAGNOSIS — F5101 Primary insomnia: Secondary | ICD-10-CM | POA: Diagnosis not present

## 2022-09-04 LAB — COMPREHENSIVE METABOLIC PANEL
ALT: 18 U/L (ref 0–35)
AST: 18 U/L (ref 0–37)
Albumin: 3.7 g/dL (ref 3.5–5.2)
Alkaline Phosphatase: 61 U/L (ref 39–117)
BUN: 14 mg/dL (ref 6–23)
CO2: 30 mEq/L (ref 19–32)
Calcium: 8.8 mg/dL (ref 8.4–10.5)
Chloride: 104 mEq/L (ref 96–112)
Creatinine, Ser: 0.81 mg/dL (ref 0.40–1.20)
GFR: 78.37 mL/min (ref >60.00)
Glucose, Bld: 89 mg/dL (ref 70–99)
Potassium: 3.7 mEq/L (ref 3.5–5.1)
Sodium: 139 mEq/L (ref 135–145)
Total Bilirubin: 0.3 mg/dL (ref 0.2–1.2)
Total Protein: 7.1 g/dL (ref 6.0–8.3)

## 2022-09-04 LAB — CBC WITH DIFFERENTIAL/PLATELET
Basophils Absolute: 0 10*3/uL (ref 0.0–0.1)
Basophils Relative: 0.4 % (ref 0.0–3.0)
Eosinophils Absolute: 0.1 10*3/uL (ref 0.0–0.7)
Eosinophils Relative: 1.6 % (ref 0.0–5.0)
HCT: 40.3 % (ref 36.0–46.0)
Hemoglobin: 13.2 g/dL (ref 12.0–15.0)
Lymphocytes Relative: 31 % (ref 12.0–46.0)
Lymphs Abs: 2.2 10*3/uL (ref 0.7–4.0)
MCHC: 32.8 g/dL (ref 30.0–36.0)
MCV: 92 fl (ref 78.0–100.0)
Monocytes Absolute: 0.5 10*3/uL (ref 0.1–1.0)
Monocytes Relative: 6.9 % (ref 3.0–12.0)
Neutro Abs: 4.3 10*3/uL (ref 1.4–7.7)
Neutrophils Relative %: 60.1 % (ref 43.0–77.0)
Platelets: 202 10*3/uL (ref 150.0–400.0)
RBC: 4.38 Mil/uL (ref 3.87–5.11)
RDW: 13.9 % (ref 11.5–15.5)
WBC: 7.2 10*3/uL (ref 4.0–10.5)

## 2022-09-04 LAB — LIPID PANEL
Cholesterol: 168 mg/dL (ref 0–200)
HDL: 67.7 mg/dL (ref >39.00)
LDL Cholesterol: 86 mg/dL (ref 0–99)
NonHDL: 100.58
Total CHOL/HDL Ratio: 2
Triglycerides: 73 mg/dL (ref 0.0–149.0)
VLDL: 14.6 mg/dL (ref 0.0–40.0)

## 2022-09-04 LAB — HEMOGLOBIN A1C: Hgb A1c MFr Bld: 5.7 % (ref 4.6–6.5)

## 2022-09-04 LAB — TSH: TSH: 1.66 u[IU]/mL (ref 0.35–5.50)

## 2022-09-04 MED ORDER — AMLODIPINE BESYLATE 10 MG PO TABS: 10.0000 mg | ORAL_TABLET | Freq: Every day | ORAL | 3 refills | Status: DC

## 2022-09-04 MED ORDER — LOSARTAN POTASSIUM 100 MG PO TABS: 100.0000 mg | ORAL_TABLET | Freq: Every day | ORAL | 3 refills | Status: DC

## 2022-09-04 MED ORDER — FUROSEMIDE 20 MG PO TABS: 20.0000 mg | ORAL_TABLET | Freq: Every day | ORAL | 3 refills | Status: DC

## 2022-09-04 MED ORDER — HYDROCHLOROTHIAZIDE 25 MG PO TABS: 25.0000 mg | ORAL_TABLET | Freq: Every day | ORAL | 3 refills | Status: DC

## 2022-09-04 MED ORDER — OMEPRAZOLE 20 MG PO CPDR: DELAYED_RELEASE_CAPSULE | ORAL | 3 refills | Status: DC

## 2022-09-04 MED ORDER — ROSUVASTATIN CALCIUM 40 MG PO TABS
40.0000 mg | ORAL_TABLET | Freq: Every day | ORAL | 3 refills | Status: DC
Start: 2022-09-04 — End: 2023-10-03

## 2022-09-04 NOTE — Progress Notes (Signed)
Subjective:    Patient ID: Mikayla Sweeney, female    DOB: 12/27/1960, 61 y.o.   MRN: [id number]  HPI Patient presents for yearly preventative medicine examination. She is a pleasant 61 year old female who  has a past medical history of Anxiety, Asthma, B12 deficiency, Bipolar 1 disorder ([place]), CHF (congestive heart failure) ([place]), Epilepsy ([place]), Hypertension, and Sarcoidosis.  Essential hypertension-managed with Norvasc 10 mg daily, Coreg 25 mg twice daily, HCTZ 25 mg daily, and Cozaar 100 mg daily.  She denies dizziness, lightheadedness, blurred vision, or headaches BP Readings from Last 3 Encounters:  09/04/22 122/86  08/25/22 (!) 166/94  07/04/22 122/82   CHF-managed with Lasix 20 mg daily.  She denies chest pain, shortness of breath, or lower extremity edema  GERD-takes Prilosec 20 mg daily  OAB-symptoms are controlled with Ditropan 5 mg at bedtime  Insomnia-longstanding issue.  Currently prescribed trazodone 50 mg/ She takes this infrequently.   Hyperlipidemia-managed with Crestor 40 mg daily.  She denies myalgia or fatigue Lab Results  Component Value Date   CHOL 256 (H) 08/31/2021   HDL 78.80 08/31/2021   LDLCALC 158 (H) 08/31/2021   TRIG 95.0 08/31/2021   CHOLHDL 3 08/31/2021   Pre diabetes -  Is now on Mounjaro 10 mg.  Lab Results  Component Value Date   HGBA1C 5.9 08/31/2021   Weight loss management - is doing an on line weight loss program through [facility]. She is currently on Mounjaro 10 mg weekly.    All immunizations and health maintenance protocols were reviewed with the patient and needed orders were placed.  Appropriate screening laboratory values were ordered for the patient including screening of hyperlipidemia, renal function and hepatic function.  Medication reconciliation,  past medical history, social history, problem list and allergies were reviewed in detail with the patient  Goals were established with regard to weight loss, exercise, and   diet in compliance with medications Wt Readings from Last 3 Encounters:  09/04/22 175 lb 9.6 oz (79.7 kg)  07/26/22 169 lb (76.7 kg)  07/04/22 169 lb (76.7 kg)   She needs a referral for colonoscopy and GYN. She will contact the breast center for mammogram   She has no acute issues   Review of Systems  Constitutional: Negative.   HENT: Negative.    Eyes: Negative.   Respiratory: Negative.    Cardiovascular: Negative.   Gastrointestinal: Negative.   Endocrine: Negative.   Genitourinary: Negative.   Musculoskeletal: Negative.   Skin: Negative.   Allergic/Immunologic: Negative.   Neurological: Negative.   Hematological: Negative.   Psychiatric/Behavioral: Negative.     Past Medical History:  Diagnosis Date   Anxiety    Asthma    B12 deficiency    Bipolar 1 disorder (HCC)    CHF (congestive heart failure) (HCC)    Epilepsy ([place])    Hypertension    Sarcoidosis     Social History   Socioeconomic History   Marital status: Married    Spouse name: Not on file   Number of children: Not on file   Years of education: Not on file   Highest education level: Not on file  Occupational History   Not on file  Tobacco Use   Smoking status: Former   Smokeless tobacco: Never  Vaping Use   Vaping Use: Never used  Substance and Sexual Activity   Alcohol use: No   Drug use: No   Sexual activity: Not Currently    Birth control/protection: None  Other Topics Concern   Not on file  Social History Narrative   She has her own business    Married    Three children    5 grandchildren       She likes to spend time with her grandchildren    Social Determinants of Health   Financial Resource Strain: Low Risk  (07/26/2022)   Overall Financial Resource Strain (CARDIA)    Difficulty of Paying Living Expenses: Not hard at all  Food Insecurity: No Food Insecurity (07/26/2022)   Hunger Vital Sign    Worried About Running Out of Food in the Last Year: Never true    Ran Out of Food in  the Last Year: Never true  Transportation Needs: No Transportation Needs (07/26/2022)   PRAPARE - Administrator, Civil Service (Medical): No    Lack of Transportation (Non-Medical): No  Physical Activity: Sufficiently Active (07/26/2022)   Exercise Vital Sign    Days of Exercise per Week: 5 days    Minutes of Exercise per Session: 40 min  Stress: No Stress Concern Present (07/26/2022)   [employer] of Occupational Health - Occupational Stress Questionnaire    Feeling of Stress : Not at all  Social Connections: Moderately Isolated (07/26/2022)   Social Connection and Isolation Panel [NHANES]    Frequency of Communication with Friends and Family: More than three times a week    Frequency of Social Gatherings with Friends and Family: More than three times a week    Attends Religious Services: Never    Database administrator or Organizations: Yes    Attends Banker Meetings: Never    Marital Status: Divorced  Catering manager Violence: Not At Risk (07/26/2022)   Humiliation, Afraid, Rape, and Kick questionnaire    Fear of Current or Ex-Partner: No    Emotionally Abused: No    Physically Abused: No    Sexually Abused: No    Past Surgical History:  Procedure Laterality Date   TONSILLECTOMY     TUBAL LIGATION      Family History  Problem Relation Age of Onset   Pancreatic cancer Mother    Hypertension Mother    Diabetes Mother    Heart disease Father    Gout Father    Cirrhosis Father    Heart disease Sister        CHF    No Known Allergies  Current Outpatient Medications on File Prior to Visit  Medication Sig Dispense Refill   albuterol (PROVENTIL HFA;VENTOLIN HFA) 108 (90 Base) MCG/ACT inhaler Inhale 1-2 puffs into the lungs every 6 (six) hours as needed. For wheeze or shortness of breath 1 Inhaler 0   amLODipine (NORVASC) 10 MG tablet Take 1 tablet (10 mg total) by mouth daily. 90 tablet 3   carvedilol (COREG) 25 MG tablet TAKE 1 TABLET BY  MOUTH TWICE A DAY WITH MEALS 180 tablet 1   fluticasone (FLONASE) 50 MCG/ACT nasal spray Place 2 sprays into both nostrils daily. 1 g 0   furosemide (LASIX) 20 MG tablet Take 1 tablet (20 mg total) by mouth daily. 90 tablet 3   hydrochlorothiazide (HYDRODIURIL) 25 MG tablet Take 1 tablet (25 mg total) by mouth daily. 90 tablet 3   ibuprofen (ADVIL) 800 MG tablet Take 800 mg by mouth every 6 (six) hours as needed.     ibuprofen (ADVIL,MOTRIN) 600 MG tablet Take 1 tablet (600 mg total) by mouth every 8 (eight) hours as needed. 30 tablet  0   omeprazole (PRILOSEC) 20 MG capsule TAKE 1 CAPSULE BY MOUTH EVERY DAY 90 capsule 0   rosuvastatin (CRESTOR) 40 MG tablet Take 1 tablet (40 mg total) by mouth daily. 90 tablet 3   traZODone (DESYREL) 50 MG tablet TAKE 1 TABLET BY MOUTH AT BEDTIME AS NEEDED FOR SLEEP 90 tablet 0   losartan (COZAAR) 100 MG tablet Take 1 tablet (100 mg total) by mouth daily. 90 tablet 3   No current facility-administered medications on file prior to visit.    BP 122/86 (BP Location: Left Arm, Patient Position: Sitting, Cuff Size: Normal)   Pulse 66   Temp 98.1 F (36.7 C) (Oral)   Ht 5' 3.75" (1.619 m)   Wt 175 lb 9.6 oz (79.7 kg)   LMP 12/10/2014   SpO2 99%   BMI 30.38 kg/m       Objective:   Physical Exam Vitals and nursing note reviewed.  Constitutional:      General: She is not in acute distress.    Appearance: Normal appearance. She is well-developed. She is not ill-appearing.  HENT:     Head: Normocephalic and atraumatic.     Right Ear: Tympanic membrane, ear canal and external ear normal. There is no impacted cerumen.     Left Ear: Tympanic membrane, ear canal and external ear normal. There is no impacted cerumen.     Nose: Nose normal. No congestion or rhinorrhea.     Mouth/Throat:     Mouth: Mucous membranes are moist.     Pharynx: Oropharynx is clear. No oropharyngeal exudate or posterior oropharyngeal erythema.  Eyes:     General:        Right eye:  No discharge.        Left eye: No discharge.     Extraocular Movements: Extraocular movements intact.     Conjunctiva/sclera: Conjunctivae normal.     Pupils: Pupils are equal, round, and reactive to light.  Neck:     Thyroid: No thyromegaly.     Vascular: No carotid bruit.     Trachea: No tracheal deviation.  Cardiovascular:     Rate and Rhythm: Normal rate and regular rhythm.     Pulses: Normal pulses.     Heart sounds: Normal heart sounds. No murmur heard.    No friction rub. No gallop.  Pulmonary:     Effort: Pulmonary effort is normal. No respiratory distress.     Breath sounds: Normal breath sounds. No stridor. No wheezing, rhonchi or rales.  Chest:     Chest wall: No tenderness.  Abdominal:     General: Abdomen is flat. Bowel sounds are normal. There is no distension.     Palpations: Abdomen is soft. There is no mass.     Tenderness: There is no abdominal tenderness. There is no right CVA tenderness, left CVA tenderness, guarding or rebound.     Hernia: No hernia is present.  Musculoskeletal:        General: No swelling, tenderness, deformity or signs of injury. Normal range of motion.     Cervical back: Normal range of motion and neck supple.     Right lower leg: No edema.     Left lower leg: No edema.  Lymphadenopathy:     Cervical: No cervical adenopathy.  Skin:    General: Skin is warm and dry.     Coloration: Skin is not jaundiced or pale.     Findings: No bruising, erythema, lesion or rash.  Neurological:  General: No focal deficit present.     Mental Status: She is alert and oriented to person, place, and time.     Cranial Nerves: No cranial nerve deficit.     Sensory: No sensory deficit.     Motor: No weakness.     Coordination: Coordination normal.     Gait: Gait normal.     Deep Tendon Reflexes: Reflexes normal.  Psychiatric:        Mood and Affect: Mood normal.        Behavior: Behavior normal.        Thought Content: Thought content normal.         Judgment: Judgment normal.       Assessment & Plan:  1. Routine general medical examination at a health care facility - Work on lifestyle modifications - Follow up in one year or sooner if needed - Ambulatory referral to Gynecology - CBC with Differential/Platelet; Future - Comprehensive metabolic panel; Future - Hemoglobin A1c; Future - Lipid panel; Future - TSH; Future - TSH - Lipid panel - Hemoglobin A1c - Comprehensive metabolic panel - CBC with Differential/Platelet  2. Essential hypertension - Well controlled. No change in medication  - CBC with Differential/Platelet; Future - Comprehensive metabolic panel; Future - Hemoglobin A1c; Future - Lipid panel; Future - TSH; Future - TSH - Lipid panel - Hemoglobin A1c - Comprehensive metabolic panel - CBC with Differential/Platelet - losartan (COZAAR) 100 MG tablet; Take 1 tablet (100 mg total) by mouth daily.  Dispense: 90 tablet; Refill: 3 - hydrochlorothiazide (HYDRODIURIL) 25 MG tablet; Take 1 tablet (25 mg total) by mouth daily.  Dispense: 90 tablet; Refill: 3 - furosemide (LASIX) 20 MG tablet; Take 1 tablet (20 mg total) by mouth daily.  Dispense: 90 tablet; Refill: 3 - amLODipine (NORVASC) 10 MG tablet; Take 1 tablet (10 mg total) by mouth daily.  Dispense: 90 tablet; Refill: 3  3. Gastroesophageal reflux disease with esophagitis without hemorrhage - Continue with PPI  - CBC with Differential/Platelet; Future - Comprehensive metabolic panel; Future - Hemoglobin A1c; Future - Lipid panel; Future - TSH; Future - TSH - Lipid panel - Hemoglobin A1c - Comprehensive metabolic panel - CBC with Differential/Platelet - omeprazole (PRILOSEC) 20 MG capsule; TAKE 1 CAPSULE BY MOUTH EVERY DAY  Dispense: 90 capsule; Refill: 3 4. Chronic diastolic CHF (congestive heart failure) (HCC) - Continue with Lasix 20 mg daily. No signs of fluid overload  - CBC with Differential/Platelet; Future - Comprehensive metabolic panel;  Future - Hemoglobin A1c; Future - Lipid panel; Future - TSH; Future - TSH - Lipid panel - Hemoglobin A1c - Comprehensive metabolic panel - CBC with Differential/Platelet - furosemide (LASIX) 20 MG tablet; Take 1 tablet (20 mg total) by mouth daily.  Dispense: 90 tablet; Refill: 3  5. Primary insomnia - Continue with Trazodone PRN   6. Colon cancer screening  - Ambulatory referral to Gastroenterology  7. Influenza vaccine needed  - Flu Vaccine QUAD 6+ mos PF IM (Fluarix Quad PF)  8. Mixed hyperlipidemia - Continue with statin  - CBC with Differential/Platelet; Future - Comprehensive metabolic panel; Future - Hemoglobin A1c; Future - Lipid panel; Future - TSH; Future - TSH - Lipid panel - Hemoglobin A1c - Comprehensive metabolic panel - CBC with Differential/Platelet - rosuvastatin (CRESTOR) 40 MG tablet; Take 1 tablet (40 mg total) by mouth daily.  Dispense: 90 tablet; Refill: 3   Shirline Frees, NP

## 2022-10-11 ENCOUNTER — Telehealth: Payer: Self-pay | Admitting: Adult Health

## 2022-10-11 NOTE — Telephone Encounter (Signed)
C/o nausea, frequent urination, body aches, x 2 days. Declined OV with another provider in the morning or to see another site.

## 2022-10-11 NOTE — Telephone Encounter (Signed)
Spoke to pt and advised that we do not have any appointments but I can get her seen with another provider. Pt declined and stated that she has a big event tomorrow that she cannot miss. Pt advised to seek help at the local UC or call us back. Pt verbalized understanding.

## 2022-10-17 ENCOUNTER — Telehealth: Payer: Self-pay | Admitting: Adult Health

## 2022-10-17 ENCOUNTER — Other Ambulatory Visit: Payer: Self-pay | Admitting: Internal Medicine

## 2022-10-17 ENCOUNTER — Encounter: Payer: Self-pay | Admitting: Internal Medicine

## 2022-10-17 ENCOUNTER — Telehealth (INDEPENDENT_AMBULATORY_CARE_PROVIDER_SITE_OTHER): Payer: Medicare Other | Admitting: Internal Medicine

## 2022-10-17 VITALS — Wt 175.0 lb

## 2022-10-17 DIAGNOSIS — R3989 Other symptoms and signs involving the genitourinary system: Secondary | ICD-10-CM | POA: Diagnosis not present

## 2022-10-17 DIAGNOSIS — R1030 Lower abdominal pain, unspecified: Secondary | ICD-10-CM

## 2022-10-17 DIAGNOSIS — M545 Low back pain, unspecified: Secondary | ICD-10-CM

## 2022-10-17 NOTE — Telephone Encounter (Signed)
Pt call and stated she is returning Rachel's call and want a call back.

## 2022-10-17 NOTE — Progress Notes (Signed)
Virtual Visit via Telephone Note  I connected with Mikayla Sweeney on 10/17/22 at  2:30 PM EST by telephone and verified that I am speaking with the correct person using two identifiers.   I discussed the limitations, risks, security and privacy concerns of performing an evaluation and management service by telephone and the availability of in person appointments. I also discussed with the patient that there may be a patient responsible charge related to this service. The patient expressed understanding and agreed to proceed.  Location patient: home Location provider: work office Participants present for the call: patient, provider Patient did not have a visit in the prior 7 days to address this/these issue(s).   History of Present Illness:  She scheduled this visit as she believes she has a UTI.  For the past couple days she has been feeling nauseous with increased urinary frequency back pain and suprapubic pain.   Observations/Objective: Patient sounds cheerful and well on the phone. I do not appreciate any increased work of breathing. Speech and thought processing are grossly intact. Patient reported vitals: None reported   Current Outpatient Medications:    albuterol (PROVENTIL HFA;VENTOLIN HFA) 108 (90 Base) MCG/ACT inhaler, Inhale 1-2 puffs into the lungs every 6 (six) hours as needed. For wheeze or shortness of breath, Disp: 1 Inhaler, Rfl: 0   amLODipine (NORVASC) 10 MG tablet, Take 1 tablet (10 mg total) by mouth daily., Disp: 90 tablet, Rfl: 3   carvedilol (COREG) 25 MG tablet, TAKE 1 TABLET BY MOUTH TWICE A DAY WITH MEALS, Disp: 180 tablet, Rfl: 1   fluticasone (FLONASE) 50 MCG/ACT nasal spray, Place 2 sprays into both nostrils daily., Disp: 1 g, Rfl: 0   furosemide (LASIX) 20 MG tablet, Take 1 tablet (20 mg total) by mouth daily., Disp: 90 tablet, Rfl: 3   hydrochlorothiazide (HYDRODIURIL) 25 MG tablet, Take 1 tablet (25 mg total) by mouth daily., Disp: 90 tablet, Rfl:  3   ibuprofen (ADVIL) 800 MG tablet, Take 800 mg by mouth every 6 (six) hours as needed., Disp: , Rfl:    ibuprofen (ADVIL,MOTRIN) 600 MG tablet, Take 1 tablet (600 mg total) by mouth every 8 (eight) hours as needed., Disp: 30 tablet, Rfl: 0   losartan (COZAAR) 100 MG tablet, Take 1 tablet (100 mg total) by mouth daily., Disp: 90 tablet, Rfl: 3   omeprazole (PRILOSEC) 20 MG capsule, TAKE 1 CAPSULE BY MOUTH EVERY DAY, Disp: 90 capsule, Rfl: 3   rosuvastatin (CRESTOR) 40 MG tablet, Take 1 tablet (40 mg total) by mouth daily., Disp: 90 tablet, Rfl: 3   tirzepatide (MOUNJARO) 10 MG/0.5ML Pen, Inject 10 mg into the skin once a week., Disp: , Rfl:    traZODone (DESYREL) 50 MG tablet, TAKE 1 TABLET BY MOUTH AT BEDTIME AS NEEDED FOR SLEEP, Disp: 90 tablet, Rfl: 0  Review of Systems:  Constitutional: Denies fever, chills, diaphoresis, appetite change.  HEENT: Denies photophobia, eye pain, redness, hearing loss, ear pain, congestion, sore throat, rhinorrhea, sneezing, mouth sores, trouble swallowing, neck pain, neck stiffness and tinnitus.   Respiratory: Denies SOB, DOE, cough, chest tightness,  and wheezing.   Cardiovascular: Denies chest pain, palpitations and leg swelling.  Gastrointestinal: Denies  vomiting,  diarrhea, constipation, blood in stool and abdominal distention.  Genitourinary: Denies dysuria, urgency,, hematuria, flank pain and difficulty urinating.  Endocrine: Denies: hot or cold intolerance, sweats, changes in hair or nails, polyuria, polydipsia. Musculoskeletal: Denies myalgias,  joint swelling, arthralgias and gait problem.  Skin: Denies  pallor, rash and wound.  Neurological: Denies dizziness, seizures, syncope, light-headedness, numbness and headaches.  Hematological: Denies adenopathy. Easy bruising, personal or family bleeding history  Psychiatric/Behavioral: Denies suicidal ideation, mood changes, confusion, nervousness, sleep disturbance and agitation   Assessment and  Plan:  Suspected UTI  -She will come to the office this afternoon to drop off a urine sample to be tested, withhold antibiotics until then.  I discussed the assessment and treatment plan with the patient. The patient was provided an opportunity to ask questions and all were answered. The patient agreed with the plan and demonstrated an understanding of the instructions.   The patient was advised to call back or seek an in-person evaluation if the symptoms worsen or if the condition fails to improve as anticipated.  I provided 13 minutes of non-face-to-face time during this encounter.   Mikayla Jan, MD [facility]

## 2022-11-02 ENCOUNTER — Ambulatory Visit (HOSPITAL_COMMUNITY)
Admission: EM | Admit: 2022-11-02 | Discharge: 2022-11-02 | Disposition: A | Discharge status: Home / Self Care | Payer: Medicare Other | Attending: Emergency Medicine | Admitting: Emergency Medicine

## 2022-11-02 ENCOUNTER — Encounter (HOSPITAL_COMMUNITY): Payer: Self-pay

## 2022-11-02 DIAGNOSIS — R350 Frequency of micturition: Secondary | ICD-10-CM

## 2022-11-02 DIAGNOSIS — J069 Acute upper respiratory infection, unspecified: Secondary | ICD-10-CM

## 2022-11-02 LAB — POCT URINALYSIS DIPSTICK, ED / UC
Bilirubin Urine: NEGATIVE
Glucose, UA: NEGATIVE mg/dL
Hgb urine dipstick: NEGATIVE
Leukocytes,Ua: NEGATIVE
Nitrite: NEGATIVE
Protein, ur: NEGATIVE mg/dL
Specific Gravity, Urine: 1.02 (ref 1.005–1.030)
Urobilinogen, UA: 0.2 mg/dL (ref 0.0–1.0)
pH: 5.5 (ref 5.0–8.0)

## 2022-11-02 MED ORDER — PROMETHAZINE-DM 6.25-15 MG/5ML PO SYRP: 5.0000 mL | ORAL_SOLUTION | Freq: Four times a day (QID) | ORAL | 0 refills | Status: DC | PRN

## 2022-11-02 MED ORDER — CEPHALEXIN 500 MG PO CAPS: 500.0000 mg | ORAL_CAPSULE | Freq: Four times a day (QID) | ORAL | 0 refills | Status: DC

## 2022-11-02 MED ORDER — BENZONATATE 100 MG PO CAPS: 100.0000 mg | ORAL_CAPSULE | Freq: Three times a day (TID) | ORAL | 0 refills | Status: DC

## 2022-11-02 NOTE — Discharge Instructions (Signed)
For your urine -Urinalysis is negative for infection, has been sent to the lab to determine if bacteria will grow, you will be notified if this occurs -As urinalysis was negative we will rule out secondary infections which may be causing symptoms, vaginal swab checking for bacterial vaginosis and yeast is pending for 2 to 3 days, you will be notified of positive test results only and medication sent in at time of notification, may see all test results on MyChart -You may attempt use of over-the-counter Pyridium (Azo, Cystex) to help manage symptoms -Increase your fluid intake with primarily water to help flush the kidneys and bladder -As always practice good hygiene wiping from front to back, urinating after sexual encounters and avoidance of scented vaginal products  For your cough and congestion -Concern persisted for 7 days without resolution we will provide bacterial coverage, the antibiotic that you are receiving today will provide coverage for both the bladder and the respiratory tract -Take Keflex every 6 hours for 7 days -You may use Tessalon pill every 8 hours to help calm your coughing -You may use cough syrup every 6 hours for additional comfort, be mindful this medication may make you drowsy -You may use any additional over-the-counter cough medicine that you deem helpful -You may follow-up with urgent care if symptoms persist past use of medication or you begin to have difficulty breathing \

## 2022-11-02 NOTE — ED Triage Notes (Signed)
Here for cough and nasal congestion x 1 week. Pt reports body aches.

## 2022-11-02 NOTE — ED Provider Notes (Signed)
[facility]    CSN: [id number] Arrival date & time: 11/02/22  1416      History   Chief Complaint Chief Complaint  Patient presents with   Cough   Nasal Congestion    HPI Mikayla Sweeney is a 61 y.o. female.   Patient presents with chills, body aches, nasal congestion, rhinorrhea, sore throat and a nonproductive cough for 7 days.  Symptoms are persisting without signs of resolution.  Tolerating food and liquids.  No known sick contacts.  Has his symptoms use of sour's out which has been helpful with management of coughing.  History of asthma, former smoker.  Denies shortness of breath or wheezing.  Patient endorses urinary frequency, bilateral lower back pain and nausea without vomiting for 7 days.  Has not attempted treatment of symptoms.  Denies dysuria, urgency, hematuria, fevers, vaginal discharge itching or odor.      Past Medical History:  Diagnosis Date   Anxiety    Asthma    B12 deficiency    Bipolar 1 disorder (HCC)    CHF (congestive heart failure) (HCC)    Epilepsy (HCC)    Hypertension    Sarcoidosis     Patient Active Problem List   Diagnosis Date Noted   Acute diastolic heart failure (HCC) 07/27/2014   Pulmonary edema 07/26/2014   Essential hypertension 07/26/2014   Sarcoidosis 07/26/2014   Bipolar 1 disorder (HCC) 07/26/2014   Headache 07/26/2014    Past Surgical History:  Procedure Laterality Date   TONSILLECTOMY     TUBAL LIGATION      OB History   No obstetric history on file.      Home Medications    Prior to Admission medications   Medication Sig Start Date End Date Taking? Authorizing Provider  albuterol (PROVENTIL HFA;VENTOLIN HFA) 108 (90 Base) MCG/ACT inhaler Inhale 1-2 puffs into the lungs every 6 (six) hours as needed. For wheeze or shortness of breath 12/14/18   Cathie Hoops, Amy V, PA-C  amLODipine (NORVASC) 10 MG tablet Take 1 tablet (10 mg total) by mouth daily. 09/04/22   [name], [name], NP  carvedilol (COREG) 25 MG tablet  TAKE 1 TABLET BY MOUTH TWICE A DAY WITH MEALS 08/16/22   [name], [name], NP  fluticasone (FLONASE) 50 MCG/ACT nasal spray Place 2 sprays into both nostrils daily. 12/14/18   Cathie Hoops, Amy V, PA-C  furosemide (LASIX) 20 MG tablet Take 1 tablet (20 mg total) by mouth daily. 09/04/22   [name], [name], NP  hydrochlorothiazide (HYDRODIURIL) 25 MG tablet Take 1 tablet (25 mg total) by mouth daily. 09/04/22   [name], [name], NP  ibuprofen (ADVIL) 800 MG tablet Take 800 mg by mouth every 6 (six) hours as needed. 08/09/21   [provider]  ibuprofen (ADVIL,MOTRIN) 600 MG tablet Take 1 tablet (600 mg total) by mouth every 8 (eight) hours as needed. 09/02/18   [name], [name], NP  losartan (COZAAR) 100 MG tablet Take 1 tablet (100 mg total) by mouth daily. 09/04/22 12/03/22  [name], [name], NP  omeprazole (PRILOSEC) 20 MG capsule TAKE 1 CAPSULE BY MOUTH EVERY DAY 09/04/22   [name], [name], NP  rosuvastatin (CRESTOR) 40 MG tablet Take 1 tablet (40 mg total) by mouth daily. 09/04/22   [name], [name], NP  tirzepatide [facility]) 10 MG/0.5ML Pen Inject 10 mg into the skin once a week.    [provider]  traZODone (DESYREL) 50 MG tablet TAKE 1 TABLET BY MOUTH AT BEDTIME AS NEEDED FOR SLEEP 05/22/22   [name],  [name], NP    Family History Family History  Problem Relation Age of Onset   Pancreatic cancer Mother    Hypertension Mother    Diabetes Mother    Heart disease Father    Gout Father    Cirrhosis Father    Heart disease Sister        CHF    Social History Social History   Tobacco Use   Smoking status: Former   Smokeless tobacco: Never  Building services engineer Use: Never used  Substance Use Topics   Alcohol use: No   Drug use: No     Allergies   Patient has no known allergies.   Review of Systems Review of Systems  Constitutional:  Positive for chills. Negative for activity change, appetite change, diaphoresis, fatigue, fever and unexpected weight change.  HENT:  Positive for  congestion, rhinorrhea and sore throat. Negative for dental problem, drooling, ear discharge, ear pain, facial swelling, hearing loss, mouth sores, nosebleeds, postnasal drip, sinus pressure, sinus pain, sneezing, tinnitus, trouble swallowing and voice change.   Respiratory:  Positive for cough. Negative for apnea, choking, chest tightness, shortness of breath, wheezing and stridor.   Genitourinary:  Positive for flank pain and frequency. Negative for decreased urine volume, difficulty urinating, dyspareunia, dysuria, enuresis, genital sores, hematuria, menstrual problem, pelvic pain, urgency, vaginal bleeding, vaginal discharge and vaginal pain.  Musculoskeletal:  Positive for myalgias. Negative for arthralgias, back pain, gait problem, joint swelling, neck pain and neck stiffness.     Physical Exam Triage Vital Signs ED Triage Vitals [11/02/22 1543]  Enc Vitals Group     BP 121/71     Pulse Rate 72     Resp 18     Temp 98.1 F (36.7 C)     Temp Source Oral     SpO2 98 %     Weight      Height      Head Circumference      Peak Flow      Pain Score      Pain Loc      Pain Edu?      Excl. in GC?    No data found.  Updated Vital Signs BP 121/71 (BP Location: Left Arm)   Pulse 72   Temp 98.1 F (36.7 C) (Oral)   Resp 18   LMP 12/10/2014   SpO2 98%   Visual Acuity Right Eye Distance:   Left Eye Distance:   Bilateral Distance:    Right Eye Near:   Left Eye Near:    Bilateral Near:     Physical Exam Constitutional:      Appearance: Normal appearance.  HENT:     Head: Normocephalic.     Right Ear: Tympanic membrane, ear canal and external ear normal.     Left Ear: Tympanic membrane, ear canal and external ear normal.     Nose: Congestion present.     Mouth/Throat:     Mouth: Mucous membranes are moist.     Pharynx: Oropharynx is clear.  Eyes:     Extraocular Movements: Extraocular movements intact.  Cardiovascular:     Rate and Rhythm: Normal rate and regular  rhythm.     Pulses: Normal pulses.     Heart sounds: Normal heart sounds.  Pulmonary:     Effort: Pulmonary effort is normal.     Breath sounds: Normal breath sounds.  Abdominal:     General: Abdomen is flat. Bowel sounds are normal.  Palpations: Abdomen is soft.     Tenderness: There is abdominal tenderness in the epigastric area and suprapubic area.     Comments: Negative for CVA tenderness, tenderness present over the lower lumbar the back bilaterally, no ecchymosis, swelling or deformity  Skin:    General: Skin is warm and dry.  Neurological:     Mental Status: She is alert and oriented to person, place, and time. Mental status is at baseline.  Psychiatric:        Mood and Affect: Mood normal.        Behavior: Behavior normal.      UC Treatments / Results  Labs (all labs ordered are listed, but only abnormal results are displayed) Labs Reviewed - No data to display  EKG   Radiology No results found.  Procedures Procedures (including critical care time)  Medications Ordered in UC Medications - No data to display  Initial Impression / Assessment and Plan / UC Course  I have reviewed the triage vital signs and the nursing notes.  Pertinent labs & imaging results that were available during my care of the patient were reviewed by me and considered in my medical decision making (see chart for details).  Acute upper respiratory infection, urinary frequency  Vital signs are stable patient is in no signs distress nor toxic appearing, symptoms have been present for 7 days without signs of resolution we will provide bacterial coverage, prescribed Tessalon and Promethazine DM as cough is one of the most worrisome symptoms today, urinalysis is negative, discussed with patient, will rule out yeast and bacterial vaginosis as potential cause, additional swab pending, declined additional STI testing, Keflex prefer prescribed for double coverage recommended additional  over-the-counter medications, Pyridium, increase fluid intake and good hygiene measures for additional support, may follow-up with urgent care or PCP as needed for reevaluation Final Clinical Impressions(s) / UC Diagnoses   Final diagnoses:  None   Discharge Instructions   None    ED Prescriptions   None    PDMP not reviewed this encounter.   Valinda Hoar, NP 11/02/22 1652

## 2022-11-03 LAB — URINE CULTURE: Culture: NO GROWTH

## 2022-11-05 ENCOUNTER — Telehealth (HOSPITAL_COMMUNITY): Payer: Self-pay

## 2022-11-05 LAB — CERVICOVAGINAL ANCILLARY ONLY
Bacterial Vaginitis (gardnerella): POSITIVE — AB
Candida Glabrata: NEGATIVE
Candida Vaginitis: NEGATIVE
Comment: NEGATIVE
Comment: NEGATIVE
Comment: NEGATIVE

## 2022-11-05 MED ORDER — METRONIDAZOLE 500 MG PO TABS: 500.0000 mg | ORAL_TABLET | Freq: Two times a day (BID) | ORAL | 0 refills | Status: DC

## 2022-11-06 ENCOUNTER — Encounter: Payer: Self-pay | Admitting: Adult Health

## 2022-11-06 ENCOUNTER — Encounter: Payer: Self-pay | Admitting: *Deleted

## 2022-11-06 ENCOUNTER — Telehealth: Payer: Self-pay | Admitting: *Deleted

## 2022-11-06 NOTE — Patient Outreach (Signed)
  Care Coordination   Initial Visit Note   11/06/2022 Name: Mikayla Sweeney MRN: [id number] DOB: Jul 08, 1961  Mikayla Sweeney is a 61 y.o. year old female who sees [name], [name], NP for primary care. I spoke with  [name] by phone today.  What matters to the patients health and wellness today?  No needs    Goals Addressed               This Visit's Progress     COMPLETED: No needs to address today (pt-stated)        Care Coordination Interventions: Provided education to patient and/or caregiver about advanced directives Reviewed medications with patient and discussed adherence with  no needed refills Reviewed scheduled/upcoming provider appointments including sufficient transportation Screening for signs and symptoms of depression related to chronic disease state  Assessed social determinant of health barriers          SDOH assessments and interventions completed:  Yes  SDOH Interventions Today    Flowsheet Row Most Recent Value  SDOH Interventions   Food Insecurity Interventions Intervention Not Indicated  Housing Interventions Intervention Not Indicated  Transportation Interventions Intervention Not Indicated  Utilities Interventions Intervention Not Indicated        Care Coordination Interventions:  Yes, provided   Follow up plan: No further intervention required.   Encounter Outcome:  Pt. Visit Completed   [name], RN Care Management Coordinator [facility] Main Office [phone]

## 2022-11-06 NOTE — Patient Instructions (Signed)
Visit Information  Thank you for taking time to visit with me today. Please don't hesitate to contact me if I can be of assistance to you.   Following are the goals we discussed today:   Goals Addressed               This Visit's Progress     COMPLETED: No needs to address today (pt-stated)        Care Coordination Interventions: Provided education to patient and/or caregiver about advanced directives Reviewed medications with patient and discussed adherence with  no needed refills Reviewed scheduled/upcoming provider appointments including sufficient transportation Screening for signs and symptoms of depression related to chronic disease state  Assessed social determinant of health barriers          Please call the care guide team at [phone] if you need to cancel or reschedule your appointment.   If you are experiencing a Mental Health or Behavioral Health Crisis or need someone to talk to, please call the Suicide and Crisis Lifeline: 988  Patient verbalizes understanding of instructions and care plan provided today and agrees to view in MyChart. Active MyChart status and patient understanding of how to access instructions and care plan via MyChart confirmed with patient.     No further follow up required: No further follow up needed at this time.  [name], RN Care Management Coordinator [facility] Main Office [phone]

## 2022-12-02 ENCOUNTER — Ambulatory Visit (HOSPITAL_COMMUNITY)
Admission: EM | Admit: 2022-12-02 | Discharge: 2022-12-02 | Disposition: A | Discharge status: Home / Self Care | Payer: Medicare Other | Attending: Internal Medicine | Admitting: Internal Medicine

## 2022-12-02 ENCOUNTER — Encounter (HOSPITAL_COMMUNITY): Payer: Self-pay

## 2022-12-02 DIAGNOSIS — H1033 Unspecified acute conjunctivitis, bilateral: Secondary | ICD-10-CM

## 2022-12-02 DIAGNOSIS — Z1152 Encounter for screening for COVID-19: Secondary | ICD-10-CM | POA: Diagnosis not present

## 2022-12-02 DIAGNOSIS — J069 Acute upper respiratory infection, unspecified: Secondary | ICD-10-CM | POA: Insufficient documentation

## 2022-12-02 DIAGNOSIS — R051 Acute cough: Secondary | ICD-10-CM | POA: Insufficient documentation

## 2022-12-02 MED ORDER — GENTAMICIN SULFATE 0.3 % OP SOLN: 2.0000 [drp] | OPHTHALMIC | 0 refills | Status: DC

## 2022-12-02 MED ORDER — PROMETHAZINE-DM 6.25-15 MG/5ML PO SYRP: 5.0000 mL | ORAL_SOLUTION | Freq: Every evening | ORAL | 0 refills | Status: DC | PRN

## 2022-12-02 MED ORDER — BENZONATATE 100 MG PO CAPS: 100.0000 mg | ORAL_CAPSULE | Freq: Three times a day (TID) | ORAL | 0 refills | Status: DC

## 2022-12-02 NOTE — ED Provider Notes (Signed)
[facility]    CSN: [id number] Arrival date & time: 12/02/22  1013      History   Chief Complaint Chief Complaint  Patient presents with   URI    HPI Mikayla Sweeney is a 61 y.o. female.   Patient presents urgent care for evaluation of cough, nasal congestion, generalized fatigue, and cold chills for the last 6 days.  History of asthma, CHF, and hypertension.  Denies leg swelling, shortness of breath, chest pain, heart palpitations, and orthopnea.  She is is a former smoker, quit 20 to 30 years ago, denies drug use.  Cough is nonproductive and worse at nighttime.  She has not needed to use her albuterol inhaler during this illness and has not heard any wheezing. No known sick contacts, nausea, vomiting, dizziness, diarrhea, blood/mucus in the stools, or body aches reported.  She also reports bilateral eye drainage that is clear with bilateral eye crusting in the mornings when she wakes up.  Eyes are not itchy.  Denies blurry vision, headache, and recent sick contacts with known bacterial pinkeye infection.  She does not wear contacts.  She has been using over-the-counter medications to help with symptoms with some relief.   URI   Past Medical History:  Diagnosis Date   Anxiety    Asthma    B12 deficiency    Bipolar 1 disorder (HCC)    CHF (congestive heart failure) (HCC)    Epilepsy (HCC)    Hypertension    Sarcoidosis     Patient Active Problem List   Diagnosis Date Noted   Acute diastolic heart failure (HCC) 07/27/2014   Pulmonary edema 07/26/2014   Essential hypertension 07/26/2014   Sarcoidosis 07/26/2014   Bipolar 1 disorder (HCC) 07/26/2014   Headache 07/26/2014    Past Surgical History:  Procedure Laterality Date   TONSILLECTOMY     TUBAL LIGATION      OB History   No obstetric history on file.      Home Medications    Prior to Admission medications   Medication Sig Start Date End Date Taking? Authorizing Provider  albuterol (PROVENTIL  HFA;VENTOLIN HFA) 108 (90 Base) MCG/ACT inhaler Inhale 1-2 puffs into the lungs every 6 (six) hours as needed. For wheeze or shortness of breath 12/14/18  Yes Yu, Amy V, PA-C  amLODipine (NORVASC) 10 MG tablet Take 1 tablet (10 mg total) by mouth daily. 09/04/22  Yes [name], [name], NP  carvedilol (COREG) 25 MG tablet TAKE 1 TABLET BY MOUTH TWICE A DAY WITH MEALS 08/16/22  Yes [name], [name], NP  fluticasone (FLONASE) 50 MCG/ACT nasal spray Place 2 sprays into both nostrils daily. 12/14/18  Yes Yu, Amy V, PA-C  furosemide (LASIX) 20 MG tablet Take 1 tablet (20 mg total) by mouth daily. 09/04/22  Yes [name], [name], NP  gentamicin (GARAMYCIN) 0.3 % ophthalmic solution Place 2 drops into both eyes every 4 (four) hours. 12/02/22  Yes Carlisle Beers, FNP  hydrochlorothiazide (HYDRODIURIL) 25 MG tablet Take 1 tablet (25 mg total) by mouth daily. 09/04/22  Yes [name], [name], NP  losartan (COZAAR) 100 MG tablet Take 1 tablet (100 mg total) by mouth daily. 09/04/22 12/03/22 Yes [name], [name], NP  omeprazole (PRILOSEC) 20 MG capsule TAKE 1 CAPSULE BY MOUTH EVERY DAY 09/04/22  Yes [name], [name], NP  promethazine-dextromethorphan (PROMETHAZINE-DM) 6.25-15 MG/5ML syrup Take 5 mLs by mouth at bedtime as needed for cough. 12/02/22  Yes Carlisle Beers, FNP  rosuvastatin (CRESTOR) 40 MG tablet Take 1  tablet (40 mg total) by mouth daily. 09/04/22  Yes [name], Kandee Keenory, NP  tirzepatide [facility](MOUNJARO) 10 MG/0.5ML Pen Inject 10 mg into the skin once a week.   Yes [provider]  traZODone (DESYREL) 50 MG tablet TAKE 1 TABLET BY MOUTH AT BEDTIME AS NEEDED FOR SLEEP 05/22/22  Yes [name], Kandee Keenory, NP  benzonatate (TESSALON) 100 MG capsule Take 1 capsule (100 mg total) by mouth every 8 (eight) hours. 12/02/22   Carlisle BeersStanhope, Jashan Cotten M, FNP  cephALEXin (KEFLEX) 500 MG capsule Take 1 capsule (500 mg total) by mouth 4 (four) times daily. 11/02/22   White, Elita BooneAdrienne R, NP  ibuprofen (ADVIL) 800 MG tablet Take 800 mg by mouth  every 6 (six) hours as needed. 08/09/21   [provider]  ibuprofen (ADVIL,MOTRIN) 600 MG tablet Take 1 tablet (600 mg total) by mouth every 8 (eight) hours as needed. 09/02/18   [name], Kandee Keenory, NP  metroNIDAZOLE (FLAGYL) 500 MG tablet Take 1 tablet (500 mg total) by mouth 2 (two) times daily. 11/05/22   Lamptey, Britta MccreedyPhilip O, MD    Family History Family History  Problem Relation Age of Onset   Pancreatic cancer Mother    Hypertension Mother    Diabetes Mother    Heart disease Father    Gout Father    Cirrhosis Father    Heart disease Sister        CHF    Social History Social History   Tobacco Use   Smoking status: Former   Smokeless tobacco: Never  Building services engineerVaping Use   Vaping Use: Never used  Substance Use Topics   Alcohol use: No   Drug use: No     Allergies   Patient has no known allergies.   Review of Systems Review of Systems Per HPI  Physical Exam Triage Vital Signs ED Triage Vitals  Enc Vitals Group     BP 12/02/22 1103 (!) 141/70     Pulse Rate 12/02/22 1103 66     Resp 12/02/22 1103 16     Temp 12/02/22 1103 98.2 F (36.8 C)     Temp Source 12/02/22 1103 Oral     SpO2 12/02/22 1103 98 %     Weight --      Height --      Head Circumference --      Peak Flow --      Pain Score 12/02/22 1102 7     Pain Loc --      Pain Edu? --      Excl. in GC? --    No data found.  Updated Vital Signs BP (!) 141/70 (BP Location: Left Arm)   Pulse 66   Temp 98.2 F (36.8 C) (Oral)   Resp 16   LMP 12/10/2014   SpO2 98%   Visual Acuity Right Eye Distance:   Left Eye Distance:   Bilateral Distance:    Right Eye Near:   Left Eye Near:    Bilateral Near:     Physical Exam Vitals and nursing note reviewed.  Constitutional:      Appearance: She is not ill-appearing or toxic-appearing.  HENT:     Head: Normocephalic and atraumatic.     Right Ear: Hearing and external ear normal.     Left Ear: Hearing and external ear normal.     Nose: Nose normal.      Mouth/Throat:     Lips: Pink.     Mouth: Mucous membranes are moist.     Pharynx:  Oropharynx is clear. Uvula midline.     Tonsils: No tonsillar exudate or tonsillar abscesses.     Comments: Small amount of clear postnasal drainage visualized to the posterior oropharynx.  Eyes:     General: Lids are normal. Vision grossly intact. Gaze aligned appropriately.     Extraocular Movements: Extraocular movements intact.     Conjunctiva/sclera:     Right eye: Right conjunctiva is not injected. No exudate.    Left eye: Left conjunctiva is not injected. No exudate.    Comments: EOMs intact without dizziness or pain elicited.  Cardiovascular:     Rate and Rhythm: Normal rate and regular rhythm.     Heart sounds: Normal heart sounds, S1 normal and S2 normal.  Pulmonary:     Effort: Pulmonary effort is normal. No respiratory distress.     Breath sounds: Normal breath sounds and air entry.  Musculoskeletal:     Cervical back: Full passive range of motion without pain and neck supple.  Lymphadenopathy:     Cervical: No cervical adenopathy.  Skin:    General: Skin is warm and dry.     Capillary Refill: Capillary refill takes less than 2 seconds.     Findings: No rash.  Neurological:     General: No focal deficit present.     Mental Status: She is alert and oriented to person, place, and time. Mental status is at baseline.     Cranial Nerves: No dysarthria or facial asymmetry.  Psychiatric:        Mood and Affect: Mood normal.        Speech: Speech normal.        Behavior: Behavior normal.        Thought Content: Thought content normal.        Judgment: Judgment normal.      UC Treatments / Results  Labs (all labs ordered are listed, but only abnormal results are displayed) Labs Reviewed  SARS CORONAVIRUS 2 (TAT 6-24 HRS)    EKG   Radiology No results found.  Procedures Procedures (including critical care time)  Medications Ordered in UC Medications - No data to  display  Initial Impression / Assessment and Plan / UC Course  I have reviewed the triage vital signs and the nursing notes.  Pertinent labs & imaging results that were available during my care of the patient were reviewed by me and considered in my medical decision making (see chart for details).   1. Acute URI, bacterial conjunctivitis, acute cough Symptoms and physical exam consistent with a viral upper respiratory tract infection that will likely resolve with rest, fluids, and prescriptions for symptomatic relief. No indication for imaging today based on stable cardiopulmonary exam and hemodynamically stable vital signs. COVID-19 testing is pending per patient request.  We will call patient if this is positive.  Quarantine guidelines discussed. Currently on day 6 of symptoms and does not qualify for antiviral therapy.   Tessalon Perles and Promethazine DM sent to pharmacy for symptomatic relief to be taken as prescribed.  2 drops of gentamicin eyedrops may be applied to bilateral eyes every 4 hours for the next 5 days to treat suspected acute bacterial conjunctivitis.  Warm compresses and increase handwashing recommended.  May continue using all other over-the-counter medicines as directed as these are helping with symptoms.  Promethazine DM cough medication may be used as needed only at bedtime due to possible drowsiness side effect (no alcohol, working, or driving while taking this advised).  May use ibuprofen/tylenol over the counter for body aches, fever/chills, and overall discomfort associated with viral illness. Nonpharmacologic interventions for symptom relief provided and after visit summary below.   Strict ED/urgent care return precautions given.  Patient verbalizes understanding and agreement with plan.  Counseled patient regarding possible side effects and uses of all medications prescribed at today's visit.  Patient verbalizes understanding and agreement with plan.  All questions  answered.  Patient discharged from urgent care in stable condition.        Final Clinical Impressions(s) / UC Diagnoses   Final diagnoses:  Acute upper respiratory infection  Acute bacterial conjunctivitis of both eyes  Acute cough     Discharge Instructions      You have a viral upper respiratory infection.  COVID-19 testing is pending. We will call you with results if positive. If your COVID test is positive, you must stay at home until day 6 of symptoms. On day 6, you may go out into public and go back to work, but you must wear a mask until day 11 of symptoms to prevent spread to others.  Continue taking over the counter medicines to help with symptoms.  Take tessalon pearles every 8 hours as needed for cough.  Take Promethazine DM cough medication to help with your cough at nighttime so that you are able to sleep. Do not drive, drink alcohol, or go to work while taking this medication since it can make you sleepy. Only take this at nighttime.   Place 2 drops of gentamicin eyedrops into both eyes every 4 hours for the next 7 days to treat pinkeye infection. Apply warm compresses to your eyes before applying eye drops.  You may do salt water and baking soda gargles every 4 hours as needed for your throat pain.  Please put 1 teaspoon of salt and 1/2 teaspoon of baking soda in 8 ounces of warm water then gargle and spit the water out. You may also put 1 tablespoon of honey in warm water and drink this to soothe your throat.  Place a humidifier in your room at night to help decrease dry air that can irritate your airway and cause you to have a sore throat and cough.  Please try to eat a well-balanced diet while you are sick so that your body gets proper nutrition to heal.  If you develop any new or worsening symptoms, please return.  If your symptoms are severe, please go to the emergency room.  Follow-up with your primary care provider for further evaluation and management of your  symptoms as well as ongoing wellness visits.  I hope you feel better!       ED Prescriptions     Medication Sig Dispense Auth. Provider   benzonatate (TESSALON) 100 MG capsule Take 1 capsule (100 mg total) by mouth every 8 (eight) hours. 21 capsule Carlisle Beers, FNP   promethazine-dextromethorphan (PROMETHAZINE-DM) 6.25-15 MG/5ML syrup Take 5 mLs by mouth at bedtime as needed for cough. 118 mL Reita May M, FNP   gentamicin (GARAMYCIN) 0.3 % ophthalmic solution Place 2 drops into both eyes every 4 (four) hours. 5 mL Carlisle Beers, FNP      PDMP not reviewed this encounter.   Carlisle Beers, [place] 12/02/22 1224

## 2022-12-02 NOTE — ED Triage Notes (Signed)
Chief Complaint: nasal congestion, hot flashes, cough productive with abdominal pain, eyes crusty and watery.   Onset: 1 week   Prescriptions or OTC medications tried: Yes- corisine     with no relief  Sick exposure: Yes- grandson but strep and flu was negative   New foods, medications, or products: No  Recent Travel: No

## 2022-12-02 NOTE — Discharge Instructions (Signed)
You have a viral upper respiratory infection.  COVID-19 testing is pending. We will call you with results if positive. If your COVID test is positive, you must stay at home until day 6 of symptoms. On day 6, you may go out into public and go back to work, but you must wear a mask until day 11 of symptoms to prevent spread to others.  Continue taking over the counter medicines to help with symptoms.  Take tessalon pearles every 8 hours as needed for cough.  Take Promethazine DM cough medication to help with your cough at nighttime so that you are able to sleep. Do not drive, drink alcohol, or go to work while taking this medication since it can make you sleepy. Only take this at nighttime.   Place 2 drops of gentamicin eyedrops into both eyes every 4 hours for the next 7 days to treat pinkeye infection. Apply warm compresses to your eyes before applying eye drops.  You may do salt water and baking soda gargles every 4 hours as needed for your throat pain.  Please put 1 teaspoon of salt and 1/2 teaspoon of baking soda in 8 ounces of warm water then gargle and spit the water out. You may also put 1 tablespoon of honey in warm water and drink this to soothe your throat.  Place a humidifier in your room at night to help decrease dry air that can irritate your airway and cause you to have a sore throat and cough.  Please try to eat a well-balanced diet while you are sick so that your body gets proper nutrition to heal.  If you develop any new or worsening symptoms, please return.  If your symptoms are severe, please go to the emergency room.  Follow-up with your primary care provider for further evaluation and management of your symptoms as well as ongoing wellness visits.  I hope you feel better!

## 2022-12-03 LAB — SARS CORONAVIRUS 2 (TAT 6-24 HRS): SARS Coronavirus 2: NEGATIVE

## 2022-12-17 DIAGNOSIS — E8881 Metabolic syndrome: Secondary | ICD-10-CM | POA: Diagnosis not present

## 2022-12-17 DIAGNOSIS — R7303 Prediabetes: Secondary | ICD-10-CM | POA: Diagnosis not present

## 2022-12-20 ENCOUNTER — Encounter: Payer: Self-pay | Admitting: Family Medicine

## 2022-12-20 ENCOUNTER — Ambulatory Visit (INDEPENDENT_AMBULATORY_CARE_PROVIDER_SITE_OTHER): Payer: 59 | Admitting: Family Medicine

## 2022-12-20 VITALS — BP 160/90 | HR 59 | Temp 98.1°F | Ht 63.75 in | Wt 187.4 lb

## 2022-12-20 DIAGNOSIS — Z1389 Encounter for screening for other disorder: Secondary | ICD-10-CM | POA: Diagnosis not present

## 2022-12-20 DIAGNOSIS — I10 Essential (primary) hypertension: Secondary | ICD-10-CM | POA: Diagnosis not present

## 2022-12-20 DIAGNOSIS — M545 Low back pain, unspecified: Secondary | ICD-10-CM

## 2022-12-20 DIAGNOSIS — E785 Hyperlipidemia, unspecified: Secondary | ICD-10-CM | POA: Diagnosis not present

## 2022-12-20 DIAGNOSIS — B9689 Other specified bacterial agents as the cause of diseases classified elsewhere: Secondary | ICD-10-CM

## 2022-12-20 DIAGNOSIS — J309 Allergic rhinitis, unspecified: Secondary | ICD-10-CM | POA: Diagnosis not present

## 2022-12-20 DIAGNOSIS — N760 Acute vaginitis: Secondary | ICD-10-CM | POA: Diagnosis not present

## 2022-12-20 DIAGNOSIS — R7303 Prediabetes: Secondary | ICD-10-CM | POA: Diagnosis not present

## 2022-12-20 LAB — POC URINALSYSI DIPSTICK (AUTOMATED)
Bilirubin, UA: NEGATIVE
Blood, UA: NEGATIVE
Glucose, UA: NEGATIVE
Ketones, UA: POSITIVE
Leukocytes, UA: NEGATIVE
Nitrite, UA: NEGATIVE
Protein, UA: NEGATIVE
Spec Grav, UA: 1.025 (ref 1.010–1.025)
Urobilinogen, UA: 0.2 E.U./dL
pH, UA: 5 (ref 5.0–8.0)

## 2022-12-20 MED ORDER — ONDANSETRON 8 MG PO TBDP: 8.0000 mg | ORAL_TABLET | Freq: Three times a day (TID) | ORAL | 0 refills | Status: DC | PRN

## 2022-12-20 MED ORDER — METRONIDAZOLE 500 MG PO TABS: 500.0000 mg | ORAL_TABLET | Freq: Three times a day (TID) | ORAL | 0 refills | Status: DC

## 2022-12-20 NOTE — Progress Notes (Signed)
   Subjective:    Patient ID: Mikayla Sweeney, female    DOB: 01-26-1961, 62 y.o.   MRN: [id number]  HPI Here for a week of fatigue and mild lower back pain. No fever or upper respiratory symptoms. No urinary urgency or burning, but her urine has a foul odor. Her BM's are normal. She was seen in urgent care on 11-02-22 for similar symptoms, although she also had a cough at that time. A UA was clear and a urine culture was negative. A wet prep was positive for Gardnerella however. At the UC visit she was given 10 days of Kelfex, and her URI symptoms resolved. The wet prep result came back a few days later, and a rx for Metronidazole was called in. However she does not remember ever picking up that prescription.   Review of Systems  Constitutional:  Positive for fatigue. Negative for fever.  Respiratory: Negative.    Cardiovascular: Negative.   Gastrointestinal: Negative.   Genitourinary:  Negative for dysuria, flank pain, frequency, hematuria, urgency and vaginal discharge.  Musculoskeletal:  Positive for back pain.       Objective:   Physical Exam Constitutional:      Appearance: Normal appearance.  HENT:     Right Ear: Tympanic membrane, ear canal and external ear normal.     Left Ear: Tympanic membrane, ear canal and external ear normal.     Nose: Nose normal.     Mouth/Throat:     Pharynx: Oropharynx is clear.  Eyes:     Conjunctiva/sclera: Conjunctivae normal.  Cardiovascular:     Rate and Rhythm: Normal rate and regular rhythm.     Pulses: Normal pulses.     Heart sounds: Normal heart sounds.  Pulmonary:     Effort: Pulmonary effort is normal.     Breath sounds: Normal breath sounds.  Abdominal:     General: Abdomen is flat. Bowel sounds are normal. There is no distension.     Palpations: Abdomen is soft. There is no mass.     Tenderness: There is no abdominal tenderness. There is no right CVA tenderness, left CVA tenderness, guarding or rebound.     Hernia: No hernia is  present.  Lymphadenopathy:     Cervical: No cervical adenopathy.  Neurological:     Mental Status: She is alert.           Assessment & Plan:  This is likely the same bacterial vaginosis that she had before. We will treat this with 10 days of Metronidazole TID. Recheck as needed. We spent a total of (31   ) minutes reviewing records and discussing these issues.   Alysia Penna, MD

## 2022-12-30 ENCOUNTER — Ambulatory Visit (HOSPITAL_COMMUNITY)
Admission: EM | Admit: 2022-12-30 | Discharge: 2022-12-30 | Disposition: A | Discharge status: Home / Self Care | Payer: 59 | Attending: Family Medicine | Admitting: Family Medicine

## 2022-12-30 ENCOUNTER — Encounter (HOSPITAL_COMMUNITY): Payer: Self-pay

## 2022-12-30 DIAGNOSIS — Z1152 Encounter for screening for COVID-19: Secondary | ICD-10-CM | POA: Diagnosis not present

## 2022-12-30 DIAGNOSIS — J069 Acute upper respiratory infection, unspecified: Secondary | ICD-10-CM | POA: Insufficient documentation

## 2022-12-30 DIAGNOSIS — H1031 Unspecified acute conjunctivitis, right eye: Secondary | ICD-10-CM | POA: Diagnosis not present

## 2022-12-30 DIAGNOSIS — J029 Acute pharyngitis, unspecified: Secondary | ICD-10-CM | POA: Diagnosis not present

## 2022-12-30 LAB — POCT RAPID STREP A, ED / UC: Streptococcus, Group A Screen (Direct): NEGATIVE

## 2022-12-30 MED ORDER — MOXIFLOXACIN HCL 0.5 % OP SOLN: 1.0000 [drp] | Freq: Three times a day (TID) | OPHTHALMIC | 0 refills | Status: DC

## 2022-12-30 MED ORDER — BENZONATATE 200 MG PO CAPS: 200.0000 mg | ORAL_CAPSULE | Freq: Three times a day (TID) | ORAL | 0 refills | Status: DC

## 2022-12-30 NOTE — Discharge Instructions (Addendum)
You were seen today for upper respiratory symptoms.  Your strep test was negative and will be sent for culture.  I have swabbed you for covid today and this will be resulted tomorrow.  We will notify you of results if positive, although you are out of the window for anti-virals.  At this time I recommend tylenol and salt water gargles for sore throat.  I recommend you trial over the counter zyrtec or claritin for nasal congestion/drainage.  I have  sent out tessalon perles to help with cough.  I have sent out an eye drop to treat for possible pink eye today.  If you develops worsening cough, shortness of breath or new fevers then please return for further evaluation.

## 2022-12-30 NOTE — ED Triage Notes (Signed)
Chief Complaint: sore throat, cough, nausea, congestion, headache, discharge from the right eye with redness.   Onset: 1 week   Prescriptions or OTC medications tried: Yes- corasine    with no relief  Sick exposure: Yes- grand-children sick but no testing done.   New foods, medications, or products: No  Recent Travel: No

## 2022-12-30 NOTE — ED Provider Notes (Signed)
[place]    CSN: [id number] Arrival date & time: 12/30/22  1356      History   Chief Complaint Chief Complaint  Patient presents with   Cough   Conjunctivitis    HPI Mikayla Sweeney is a 62 y.o. female.   Patient is here for uri symptoms x 1 week.  Having sinus congestion, drainage, cough, sore throat.  Also with headache.  No known fevers.  + nausea, no vomiting.  Right eye started with drainage several days ago.  Matted shut in the  am.  Green drainage.  Grand kids were sick last weekend with URI symptoms.  Using coricidin only.        Past Medical History:  Diagnosis Date   Anxiety    Asthma    B12 deficiency    Bipolar 1 disorder ([place])    CHF (congestive heart failure) ([place])    Epilepsy ([place])    Hypertension    Sarcoidosis     Patient Active Problem List   Diagnosis Date Noted   Acute diastolic heart failure ([place]) 07/27/2014   Pulmonary edema 07/26/2014   Essential hypertension 07/26/2014   Sarcoidosis 07/26/2014   Bipolar 1 disorder ([place]) 07/26/2014   Headache 07/26/2014    Past Surgical History:  Procedure Laterality Date   TONSILLECTOMY     TUBAL LIGATION      OB History   No obstetric history on file.      Home Medications    Prior to Admission medications   Medication Sig Start Date End Date Taking? Authorizing Provider  albuterol (PROVENTIL HFA;VENTOLIN HFA) 108 (90 Base) MCG/ACT inhaler Inhale 1-2 puffs into the lungs every 6 (six) hours as needed. For wheeze or shortness of breath 12/14/18  Yes Yu, Amy V, PA-C  amLODipine (NORVASC) 10 MG tablet Take 1 tablet (10 mg total) by mouth daily. 09/04/22  Yes [name], Tommi Rumps, NP  benzonatate (TESSALON) 100 MG capsule Take 1 capsule (100 mg total) by mouth every 8 (eight) hours. 12/02/22  Yes Talbot Grumbling, FNP  carvedilol (COREG) 25 MG tablet TAKE 1 TABLET BY MOUTH TWICE A DAY WITH MEALS 08/16/22  Yes [name], Tommi Rumps, NP  cephALEXin (KEFLEX) 500 MG capsule Take 1 capsule (500  mg total) by mouth 4 (four) times daily. 11/02/22  Yes White, Adrienne R, NP  fluticasone (FLONASE) 50 MCG/ACT nasal spray Place 2 sprays into both nostrils daily. 12/14/18  Yes Yu, Amy V, PA-C  furosemide (LASIX) 20 MG tablet Take 1 tablet (20 mg total) by mouth daily. 09/04/22  Yes [name], Tommi Rumps, NP  gentamicin (GARAMYCIN) 0.3 % ophthalmic solution Place 2 drops into both eyes every 4 (four) hours. 12/02/22  Yes Talbot Grumbling, FNP  hydrochlorothiazide (HYDRODIURIL) 25 MG tablet Take 1 tablet (25 mg total) by mouth daily. 09/04/22  Yes [name], Tommi Rumps, NP  ibuprofen (ADVIL) 800 MG tablet Take 800 mg by mouth every 6 (six) hours as needed. 08/09/21  Yes [provider]  ibuprofen (ADVIL,MOTRIN) 600 MG tablet Take 1 tablet (600 mg total) by mouth every 8 (eight) hours as needed. 09/02/18  Yes [name], Tommi Rumps, NP  metroNIDAZOLE (FLAGYL) 500 MG tablet Take 1 tablet (500 mg total) by mouth 3 (three) times daily. 12/20/22  Yes Laurey Morale, MD  omeprazole (PRILOSEC) 20 MG capsule TAKE 1 CAPSULE BY MOUTH EVERY DAY 09/04/22  Yes [name], Tommi Rumps, NP  ondansetron (ZOFRAN-ODT) 8 MG disintegrating tablet Take 1 tablet (8 mg total) by mouth every 8 (eight) hours as  needed for nausea or vomiting. 12/20/22  Yes Laurey Morale, MD  promethazine-dextromethorphan (PROMETHAZINE-DM) 6.25-15 MG/5ML syrup Take 5 mLs by mouth at bedtime as needed for cough. 12/02/22  Yes Talbot Grumbling, FNP  rosuvastatin (CRESTOR) 40 MG tablet Take 1 tablet (40 mg total) by mouth daily. 09/04/22  Yes [name], Tommi Rumps, NP  tirzepatide [facility]) 10 MG/0.5ML Pen Inject 10 mg into the skin once a week.   Yes [provider]  traZODone (DESYREL) 50 MG tablet TAKE 1 TABLET BY MOUTH AT BEDTIME AS NEEDED FOR SLEEP 05/22/22  Yes [name], Tommi Rumps, NP  losartan (COZAAR) 100 MG tablet Take 1 tablet (100 mg total) by mouth daily. 09/04/22 12/03/22  Dorothyann Peng, NP    Family History Family History  Problem Relation Age of Onset    Pancreatic cancer Mother    Hypertension Mother    Diabetes Mother    Heart disease Father    Gout Father    Cirrhosis Father    Heart disease Sister        CHF    Social History Social History   Tobacco Use   Smoking status: Former   Smokeless tobacco: Never  Scientific laboratory technician Use: Never used  Substance Use Topics   Alcohol use: No   Drug use: No     Allergies   Patient has no known allergies.   Review of Systems Review of Systems  Constitutional:  Negative for chills and fever.  HENT:  Positive for congestion, rhinorrhea and sore throat.   Respiratory:  Positive for cough.   Gastrointestinal: Negative.   Genitourinary: Negative.   Musculoskeletal: Negative.   Psychiatric/Behavioral: Negative.       Physical Exam Triage Vital Signs ED Triage Vitals  Enc Vitals Group     BP 12/30/22 1608 (!) 153/80     Pulse Rate 12/30/22 1608 77     Resp 12/30/22 1608 16     Temp 12/30/22 1608 99 F (37.2 C)     Temp Source 12/30/22 1608 Oral     SpO2 12/30/22 1608 97 %     Weight 12/30/22 1607 185 lb (83.9 kg)     Height 12/30/22 1607 5\' 2"  (1.575 m)     Head Circumference --      Peak Flow --      Pain Score 12/30/22 1607 8     Pain Loc --      Pain Edu? --      Excl. in [place]? --    No data found.  Updated Vital Signs BP (!) 153/80 (BP Location: Left Arm)   Pulse 77   Temp 99 F (37.2 C) (Oral)   Resp 16   Ht 5\' 2"  (1.575 m)   Wt 83.9 kg   LMP 12/10/2014   SpO2 97%   BMI 33.84 kg/m   Visual Acuity Right Eye Distance:   Left Eye Distance:   Bilateral Distance:    Right Eye Near:   Left Eye Near:    Bilateral Near:     Physical Exam Constitutional:      General: She is not in acute distress.    Appearance: Normal appearance. She is not ill-appearing.  HENT:     Right Ear: Tympanic membrane normal.     Left Ear: Tympanic membrane normal.     Nose: Congestion and rhinorrhea present.     Mouth/Throat:     Mouth: Mucous membranes are moist.      Pharynx: Posterior oropharyngeal  erythema present. No oropharyngeal exudate.  Eyes:     Comments: The right sclera and conjunctiva are red, injected;  +watery/yellow drainage is present.  Cardiovascular:     Rate and Rhythm: Normal rate and regular rhythm.  Pulmonary:     Effort: Pulmonary effort is normal.     Breath sounds: Normal breath sounds. No wheezing or rhonchi.  Musculoskeletal:     Cervical back: Normal range of motion and neck supple. No tenderness.  Skin:    General: Skin is warm.  Neurological:     General: No focal deficit present.     Mental Status: She is alert.  Psychiatric:        Mood and Affect: Mood normal.      UC Treatments / Results  Labs (all labs ordered are listed, but only abnormal results are displayed) Labs Reviewed  CULTURE, GROUP A STREP ([facility])  SARS CORONAVIRUS 2 (TAT 6-24 HRS)  POCT RAPID STREP A, ED / UC    EKG   Radiology No results found.  Procedures Procedures (including critical care time)  Medications Ordered in UC Medications - No data to display  Initial Impression / Assessment and Plan / UC Course  I have reviewed the triage vital signs and the nursing notes.  Pertinent labs & imaging results that were available during my care of the patient were reviewed by me and considered in my medical decision making (see chart for details).   Final Clinical Impressions(s) / UC Diagnoses   Final diagnoses:  Upper respiratory tract infection, unspecified type  Sore throat  Acute bacterial conjunctivitis of right eye    You were seen today for upper respiratory symptoms.  Your strep test was negative and will be sent for culture.  I have swabbed you for covid today and this will be resulted tomorrow.  We will notify you of results if positive, although you are out of the window for anti-virals.  At this time I recommend tylenol and salt water gargles for sore throat.  I recommend you trial over the counter zyrtec or claritin  for nasal congestion/drainage.  I have  sent out tessalon perles to help with cough.  I have sent out an eye drop to treat for possible pink eye today.  If you develops worsening cough, shortness of breath or new fevers then please return for further evaluation.   ED Prescriptions     Medication Sig Dispense Auth. Provider   moxifloxacin (VIGAMOX) 0.5 % ophthalmic solution Place 1 drop into the right eye 3 (three) times daily. 3 mL Meila Berke, MD   benzonatate (TESSALON) 200 MG capsule Take 1 capsule (200 mg total) by mouth 3 (three) times daily. 21 capsule Jannifer Franklin, MD      PDMP not reviewed this encounter.   Jannifer Franklin, MD 12/30/22 [phone]

## 2022-12-31 LAB — SARS CORONAVIRUS 2 (TAT 6-24 HRS): SARS Coronavirus 2: NEGATIVE

## 2023-01-02 DIAGNOSIS — E88819 Insulin resistance, unspecified: Secondary | ICD-10-CM | POA: Diagnosis not present

## 2023-01-02 DIAGNOSIS — E8881 Metabolic syndrome: Secondary | ICD-10-CM | POA: Diagnosis not present

## 2023-01-02 LAB — CULTURE, GROUP A STREP (THRC)

## 2023-01-04 ENCOUNTER — Telehealth: Payer: Self-pay | Admitting: Adult Health

## 2023-01-04 NOTE — Telephone Encounter (Signed)
Tried to call pt. Pt to advise she may need to schedule an appt. Will route to [facility] for advise.

## 2023-01-04 NOTE — Telephone Encounter (Signed)
Patient notified of update  and verbalized understanding. Pt advised to go to UC since it is the EOD. Pt verbalized understanding.

## 2023-01-04 NOTE — Telephone Encounter (Signed)
Patient states she was prescribed and antibiotic on 12/20/2022 and is requesting a prescription for something comparable as her symptoms have not cleared out completely. Pls advise

## 2023-01-08 ENCOUNTER — Encounter: Payer: Self-pay | Admitting: Adult Health

## 2023-01-08 ENCOUNTER — Ambulatory Visit (INDEPENDENT_AMBULATORY_CARE_PROVIDER_SITE_OTHER): Payer: 59 | Admitting: Adult Health

## 2023-01-08 VITALS — BP 150/110 | HR 74 | Temp 98.4°F | Ht 62.0 in | Wt 190.0 lb

## 2023-01-08 DIAGNOSIS — N760 Acute vaginitis: Secondary | ICD-10-CM | POA: Diagnosis not present

## 2023-01-08 DIAGNOSIS — Z1211 Encounter for screening for malignant neoplasm of colon: Secondary | ICD-10-CM | POA: Diagnosis not present

## 2023-01-08 DIAGNOSIS — R350 Frequency of micturition: Secondary | ICD-10-CM

## 2023-01-08 DIAGNOSIS — I10 Essential (primary) hypertension: Secondary | ICD-10-CM

## 2023-01-08 DIAGNOSIS — B9689 Other specified bacterial agents as the cause of diseases classified elsewhere: Secondary | ICD-10-CM | POA: Diagnosis not present

## 2023-01-08 LAB — URINALYSIS
Bilirubin Urine: NEGATIVE
Hgb urine dipstick: NEGATIVE
Ketones, ur: NEGATIVE
Leukocytes,Ua: NEGATIVE
Nitrite: NEGATIVE
Specific Gravity, Urine: >=1.03 — AB (ref 1.000–1.030)
Urine Glucose: NEGATIVE
Urobilinogen, UA: 0.2 (ref 0.0–1.0)
pH: 6 (ref 5.0–8.0)

## 2023-01-08 MED ORDER — CLINDAMYCIN HCL 300 MG PO CAPS: 300.0000 mg | ORAL_CAPSULE | Freq: Two times a day (BID) | ORAL | 0 refills | Status: AC

## 2023-01-08 NOTE — Progress Notes (Signed)
Subjective:    Patient ID: Mikayla Sweeney, female    DOB: 08-Nov-1961, 62 y.o.   MRN: [id number]  HPI 62 year old female who  has a past medical history of Anxiety, Asthma, B12 deficiency, Bipolar 1 disorder ([place]), CHF (congestive heart failure) ([place]), Epilepsy ([place]), Hypertension, and Sarcoidosis.  She presents to the office today for follow up.  She was seen at urgent care on November 02, 2022 for symptoms of fatigue, low back pain, and urine had a foul odor.  Her UA was clear and her urine culture was negative.  A wet prep was positive for Gardnerella.  At urgent care she was given 10 days of Keflex for URI symptoms this resolved her symptoms.  The wet prep result came back a few days later for bacterial vaginosis and a prescription for Flagyl was called in.  On 12/20/2022 she was seen in the office by another provider for similar symptoms except for upper respiratory symptoms.  She did not remember picking up the prescription for Flagyl so this was resent in.  She reports that her symptoms resolved while she was taking the medication but is finishing her antibiotics roughly 6 days ago her symptoms have come back.  Does include fatigue, nausea, lower abdominal pain, vaginal discharge and vaginal odor.  He does have some urinary urgency and frequent see but does not have any dysuria, or hematuria  He has been referred to GYN in the past but never called them back to make an appointment.  The same for her colonoscopy as well   Review of Systems See HPI   Past Medical History:  Diagnosis Date   Anxiety    Asthma    B12 deficiency    Bipolar 1 disorder (H[facility])    CHF (congestive heart failure) ([place])    Epilepsy ([place])    Hypertension    Sarcoidosis     Social History   Socioeconomic History   Marital status: Married    Spouse name: Not on file   Number of children: Not on file   Years of education: Not on file   Highest education level: Not on file  Occupational History   Not on file   Tobacco Use   Smoking status: Former   Smokeless tobacco: Never  Vaping Use   Vaping Use: Never used  Substance and Sexual Activity   Alcohol use: No   Drug use: No   Sexual activity: Not Currently    Birth control/protection: None  Other Topics Concern   Not on file  Social History Narrative   She has her own business    Married    Three children    5 grandchildren       She likes to spend time with her grandchildren    Social Determinants of Health   Financial Resource Strain: Low Risk  (07/26/2022)   Overall Financial Resource Strain (CARDIA)    Difficulty of Paying Living Expenses: Not hard at all  Food Insecurity: No Food Insecurity (11/06/2022)   Hunger Vital Sign    Worried About Running Out of Food in the Last Year: Never true    [place] in the Last Year: Never true  Transportation Needs: No Transportation Needs (11/06/2022)   PRAPARE - Hydrologist (Medical): No    Lack of Transportation (Non-Medical): No  Physical Activity: Sufficiently Active (07/26/2022)   Exercise Vital Sign    Days of Exercise per Week: 5 days  Minutes of Exercise per Session: 40 min  Stress: No Stress Concern Present (07/26/2022)   [place]    Feeling of Stress : Not at all  Social Connections: Moderately Isolated (07/26/2022)   Social Connection and Isolation Panel [NHANES]    Frequency of Communication with Friends and Family: More than three times a week    Frequency of Social Gatherings with Friends and Family: More than three times a week    Attends Religious Services: Never    Marine scientist or Organizations: Yes    Attends Archivist Meetings: Never    Marital Status: Divorced  Human resources officer Violence: Not At Risk (07/26/2022)   Humiliation, Afraid, Rape, and Kick questionnaire    Fear of Current or Ex-Partner: No    Emotionally Abused: No    Physically  Abused: No    Sexually Abused: No    Past Surgical History:  Procedure Laterality Date   TONSILLECTOMY     TUBAL LIGATION      Family History  Problem Relation Age of Onset   Pancreatic cancer Mother    Hypertension Mother    Diabetes Mother    Heart disease Father    Gout Father    Cirrhosis Father    Heart disease Sister        CHF    No Known Allergies  Current Outpatient Medications on File Prior to Visit  Medication Sig Dispense Refill   albuterol (PROVENTIL HFA;VENTOLIN HFA) 108 (90 Base) MCG/ACT inhaler Inhale 1-2 puffs into the lungs every 6 (six) hours as needed. For wheeze or shortness of breath 1 Inhaler 0   amLODipine (NORVASC) 10 MG tablet Take 1 tablet (10 mg total) by mouth daily. 90 tablet 3   benzonatate (TESSALON) 200 MG capsule Take 1 capsule (200 mg total) by mouth 3 (three) times daily. 21 capsule 0   carvedilol (COREG) 25 MG tablet TAKE 1 TABLET BY MOUTH TWICE A DAY WITH MEALS 180 tablet 1   fluticasone (FLONASE) 50 MCG/ACT nasal spray Place 2 sprays into both nostrils daily. 1 g 0   furosemide (LASIX) 20 MG tablet Take 1 tablet (20 mg total) by mouth daily. 90 tablet 3   hydrochlorothiazide (HYDRODIURIL) 25 MG tablet Take 1 tablet (25 mg total) by mouth daily. 90 tablet 3   ibuprofen (ADVIL) 800 MG tablet Take 800 mg by mouth every 6 (six) hours as needed.     ibuprofen (ADVIL,MOTRIN) 600 MG tablet Take 1 tablet (600 mg total) by mouth every 8 (eight) hours as needed. 30 tablet 0   losartan (COZAAR) 100 MG tablet Take 1 tablet (100 mg total) by mouth daily. 90 tablet 3   moxifloxacin (VIGAMOX) 0.5 % ophthalmic solution Place 1 drop into the right eye 3 (three) times daily. 3 mL 0   omeprazole (PRILOSEC) 20 MG capsule TAKE 1 CAPSULE BY MOUTH EVERY DAY 90 capsule 3   ondansetron (ZOFRAN-ODT) 8 MG disintegrating tablet Take 1 tablet (8 mg total) by mouth every 8 (eight) hours as needed for nausea or vomiting. 60 tablet 0   promethazine-dextromethorphan  (PROMETHAZINE-DM) 6.25-15 MG/5ML syrup Take 5 mLs by mouth at bedtime as needed for cough. 118 mL 0   rosuvastatin (CRESTOR) 40 MG tablet Take 1 tablet (40 mg total) by mouth daily. 90 tablet 3   tirzepatide (MOUNJARO) 10 MG/0.5ML Pen Inject 10 mg into the skin once a week.     traZODone (DESYREL) 50 MG  tablet TAKE 1 TABLET BY MOUTH AT BEDTIME AS NEEDED FOR SLEEP 90 tablet 0   No current facility-administered medications on file prior to visit.    BP (!) 150/110   Pulse 74   Temp 98.4 F (36.9 C) (Oral)   Ht 5\' 2"  (1.575 m)   Wt 190 lb (86.2 kg)   LMP 12/10/2014   SpO2 98%   BMI 34.75 kg/m       Objective:   Physical Exam Vitals and nursing note reviewed.  Constitutional:      Appearance: Normal appearance.  Cardiovascular:     Rate and Rhythm: Normal rate and regular rhythm.     Pulses: Normal pulses.     Heart sounds: Normal heart sounds.  Pulmonary:     Effort: Pulmonary effort is normal.     Breath sounds: Normal breath sounds.  Abdominal:     General: Abdomen is flat. Bowel sounds are normal.     Palpations: Abdomen is soft.  Musculoskeletal:        General: Normal range of motion.  Skin:    General: Skin is warm and dry.  Neurological:     General: No focal deficit present.     Mental Status: She is alert and oriented to person, place, and time.  Psychiatric:        Mood and Affect: Mood normal.        Behavior: Behavior normal.        Thought Content: Thought content normal.        Judgment: Judgment normal.        Assessment & Plan:  1. Bacterial vaginosis - Will treat with Clindamycin since symptoms did not resolve with Flagyl.  She was advised to take probiotics with her antibiotic.  Urged to call GYN back and schedule an appointment - clindamycin (CLEOCIN) 300 MG capsule; Take 1 capsule (300 mg total) by mouth 2 (two) times daily for 7 days.  Dispense: 14 capsule; Refill: 0  2. Urinary frequency  Urinalysis; Future  3. Essential hypertension -  Not at goal today, took medication prior to arrival. Is taking her medications but not checking at home  - Advised to start checking at home and follow up if still elevated  4. Colon cancer screening - Phone number given to [facility] to schedule her colonoscopy   Dorothyann Peng, NP

## 2023-01-08 NOTE — Patient Instructions (Addendum)
I am going to treat you with Clindamycin   Take a probiotic with the antibiotic   Call GYN    Please call [facility] - for Colonoscopy  Address: [address] [place], [place], [place] [address] Phone: [phone]

## 2023-01-11 ENCOUNTER — Encounter: Payer: Self-pay | Admitting: Gastroenterology

## 2023-01-16 ENCOUNTER — Ambulatory Visit
Admission: RE | Admit: 2023-01-16 | Discharge: 2023-01-16 | Disposition: A | Discharge status: Home / Self Care | Payer: 59 | Source: Ambulatory Visit | Attending: Adult Health | Admitting: Adult Health

## 2023-01-16 DIAGNOSIS — Z1231 Encounter for screening mammogram for malignant neoplasm of breast: Secondary | ICD-10-CM

## 2023-01-21 ENCOUNTER — Other Ambulatory Visit: Payer: Self-pay | Admitting: Family Medicine

## 2023-01-29 ENCOUNTER — Telehealth: Payer: Self-pay | Admitting: *Deleted

## 2023-01-29 NOTE — Telephone Encounter (Signed)
Attempted to reach pt. LM with call back#

## 2023-02-19 ENCOUNTER — Encounter: Payer: 59 | Admitting: Gastroenterology

## 2023-02-26 ENCOUNTER — Other Ambulatory Visit: Payer: Self-pay | Admitting: Adult Health

## 2023-02-26 DIAGNOSIS — I5031 Acute diastolic (congestive) heart failure: Secondary | ICD-10-CM

## 2023-02-26 DIAGNOSIS — I10 Essential (primary) hypertension: Secondary | ICD-10-CM

## 2023-03-25 ENCOUNTER — Ambulatory Visit (INDEPENDENT_AMBULATORY_CARE_PROVIDER_SITE_OTHER): Payer: 59

## 2023-03-25 ENCOUNTER — Encounter (HOSPITAL_COMMUNITY): Payer: Self-pay

## 2023-03-25 ENCOUNTER — Ambulatory Visit (HOSPITAL_COMMUNITY)
Admission: EM | Admit: 2023-03-25 | Discharge: 2023-03-25 | Disposition: A | Discharge status: Home / Self Care | Payer: 59 | Attending: Physician Assistant | Admitting: Physician Assistant

## 2023-03-25 DIAGNOSIS — K5900 Constipation, unspecified: Secondary | ICD-10-CM

## 2023-03-25 DIAGNOSIS — R350 Frequency of micturition: Secondary | ICD-10-CM

## 2023-03-25 DIAGNOSIS — R109 Unspecified abdominal pain: Secondary | ICD-10-CM | POA: Diagnosis not present

## 2023-03-25 DIAGNOSIS — R1030 Lower abdominal pain, unspecified: Secondary | ICD-10-CM

## 2023-03-25 DIAGNOSIS — Z113 Encounter for screening for infections with a predominantly sexual mode of transmission: Secondary | ICD-10-CM

## 2023-03-25 LAB — POCT URINALYSIS DIPSTICK, ED / UC
Glucose, UA: NEGATIVE mg/dL
Hgb urine dipstick: NEGATIVE
Ketones, ur: 40 mg/dL — AB
Leukocytes,Ua: NEGATIVE
Nitrite: NEGATIVE
Protein, ur: NEGATIVE mg/dL
Specific Gravity, Urine: 1.025 (ref 1.005–1.030)
Urobilinogen, UA: 0.2 mg/dL (ref 0.0–1.0)
pH: 5 (ref 5.0–8.0)

## 2023-03-25 LAB — POC URINE PREG, ED: Preg Test, Ur: NEGATIVE

## 2023-03-25 MED ORDER — POLYETHYLENE GLYCOL 3350 17 G PO PACK: 17.0000 g | PACK | Freq: Every day | ORAL | 0 refills | Status: DC | PRN

## 2023-03-25 NOTE — ED Triage Notes (Signed)
Pt states that she has been peeing a lot,back hurting, feels like she's not completely voiding, nausea. X7 days. Took meds for nausea. Zofran

## 2023-03-25 NOTE — ED Notes (Signed)
Cyto verified labeling at bedside.  Placed in lab.

## 2023-03-25 NOTE — ED Provider Notes (Signed)
[facility]    CSN: [id number] Arrival date & time: 03/25/23  1643      History   Chief Complaint Chief Complaint  Patient presents with   UTI    Urinary Frequency   Nausea    HPI Mikayla Sweeney is a 62 y.o. female.   Patient presents today with a weeklong history of UTI symptoms.  She reports urinary frequency, urinary urgency, lower abdominal pain, right-sided thoracic back pain.  She denies any fever, vomiting, vaginal symptoms, hematuria.  She does have a history of kidney stones but has not had one recently.  She does have a history of recurrent UTI as well.  She denies any recent antibiotics in the past 90 days.  She has been taking Zofran for nausea but this has not provided resolution of symptoms; has not tried additional over-the-counter medication.  Denies any recent urogenital procedure, self-catheterization.  Denies history of diabetes and does not take SGLT2 inhibitor.  She reports pain is rated 8 on a 0-10 pain scale, described as aching, no aggravating relieving factors identified.    Past Medical History:  Diagnosis Date   Anxiety    Asthma    B12 deficiency    Bipolar 1 disorder    CHF (congestive heart failure)    Epilepsy    Hypertension    Sarcoidosis     Patient Active Problem List   Diagnosis Date Noted   Acute diastolic heart failure 07/27/2014   Pulmonary edema 07/26/2014   Essential hypertension 07/26/2014   Sarcoidosis 07/26/2014   Bipolar 1 disorder 07/26/2014   Headache 07/26/2014    Past Surgical History:  Procedure Laterality Date   TONSILLECTOMY     TUBAL LIGATION      OB History   No obstetric history on file.      Home Medications    Prior to Admission medications   Medication Sig Start Date End Date Taking? Authorizing Provider  albuterol (PROVENTIL HFA;VENTOLIN HFA) 108 (90 Base) MCG/ACT inhaler Inhale 1-2 puffs into the lungs every 6 (six) hours as needed. For wheeze or shortness of breath 12/14/18  Yes Yu, Amy  V, PA-C  amLODipine (NORVASC) 10 MG tablet Take 1 tablet (10 mg total) by mouth daily. 09/04/22  Yes [name], [name], NP  carvedilol (COREG) 25 MG tablet TAKE 1 TABLET BY MOUTH TWICE A DAY WITH FOOD 02/26/23  Yes [name], [name], NP  fluticasone (FLONASE) 50 MCG/ACT nasal spray Place 2 sprays into both nostrils daily. 12/14/18  Yes Yu, Amy V, PA-C  furosemide (LASIX) 20 MG tablet Take 1 tablet (20 mg total) by mouth daily. 09/04/22  Yes [name], [name], NP  hydrochlorothiazide (HYDRODIURIL) 25 MG tablet Take 1 tablet (25 mg total) by mouth daily. 09/04/22  Yes [name], [name], NP  ibuprofen (ADVIL) 800 MG tablet Take 800 mg by mouth every 6 (six) hours as needed. 08/09/21  Yes [provider]  ondansetron (ZOFRAN-ODT) 8 MG disintegrating tablet TAKE 1 TABLET BY MOUTH EVERY 8 HOURS AS NEEDED FOR NAUSEA OR VOMITING. 01/22/23  Yes [name], [name], NP  polyethylene glycol (MIRALAX) 17 g packet Take 17 g by mouth daily as needed. 03/25/23  Yes Sotirios Navarro K, PA-C  ibuprofen (ADVIL,MOTRIN) 600 MG tablet Take 1 tablet (600 mg total) by mouth every 8 (eight) hours as needed. 09/02/18   [name], [name], NP  losartan (COZAAR) 100 MG tablet Take 1 tablet (100 mg total) by mouth daily. 09/04/22 12/03/22  Shirline Frees, NP  promethazine-dextromethorphan (PROMETHAZINE-DM) 6.25-15 MG/5ML  syrup Take 5 mLs by mouth at bedtime as needed for cough. 12/02/22   Carlisle Beers, FNP  rosuvastatin (CRESTOR) 40 MG tablet Take 1 tablet (40 mg total) by mouth daily. 09/04/22   [name], [name], NP  tirzepatide [facility]) 10 MG/0.5ML Pen Inject 10 mg into the skin once a week.    [provider]  traZODone (DESYREL) 50 MG tablet TAKE 1 TABLET BY MOUTH AT BEDTIME AS NEEDED FOR SLEEP 05/22/22   [name], [name], NP    Family History Family History  Problem Relation Age of Onset   Pancreatic cancer Mother    Hypertension Mother    Diabetes Mother    Heart disease Father    Gout Father    Cirrhosis Father    Heart  disease Sister        CHF    Social History Social History   Tobacco Use   Smoking status: Former   Smokeless tobacco: Never  Building services engineer Use: Never used  Substance Use Topics   Alcohol use: No   Drug use: No     Allergies   Patient has no known allergies.   Review of Systems Review of Systems  Constitutional:  Positive for activity change. Negative for appetite change, fatigue and fever.  Respiratory:  Negative for cough and shortness of breath.   Cardiovascular:  Negative for chest pain.  Gastrointestinal:  Positive for abdominal pain and nausea. Negative for diarrhea and vomiting.  Genitourinary:  Positive for frequency and urgency. Negative for dysuria, hematuria, vaginal bleeding, vaginal discharge and vaginal pain.  Musculoskeletal:  Positive for back pain. Negative for arthralgias and myalgias.     Physical Exam Triage Vital Signs ED Triage Vitals  Enc Vitals Group     BP 03/25/23 1751 (!) 142/81     Pulse Rate 03/25/23 1751 (!) 56     Resp 03/25/23 1751 18     Temp 03/25/23 1751 97.9 F (36.6 C)     Temp Source 03/25/23 1751 Oral     SpO2 03/25/23 1751 98 %     Weight 03/25/23 1749 170 lb (77.1 kg)     Height --      Head Circumference --      Peak Flow --      Pain Score 03/25/23 1749 8     Pain Loc --      Pain Edu? --      Excl. in GC? --    No data found.  Updated Vital Signs BP (!) 142/81 (BP Location: Right Arm)   Pulse (!) 56   Temp 97.9 F (36.6 C) (Oral)   Resp 18   Wt 170 lb (77.1 kg)   LMP 12/10/2014   SpO2 98%   BMI 31.09 kg/m   Visual Acuity Right Eye Distance:   Left Eye Distance:   Bilateral Distance:    Right Eye Near:   Left Eye Near:    Bilateral Near:     Physical Exam Vitals reviewed.  Constitutional:      General: She is awake. She is not in acute distress.    Appearance: Normal appearance. She is well-developed. She is not ill-appearing.     Comments: Very pleasant female appears stated age in no  acute distress sitting comfortably in exam room  HENT:     Head: Normocephalic and atraumatic.  Cardiovascular:     Rate and Rhythm: Normal rate and regular rhythm.     Heart sounds: Normal heart  sounds, S1 normal and S2 normal. No murmur heard. Pulmonary:     Effort: Pulmonary effort is normal.     Breath sounds: Normal breath sounds. No wheezing, rhonchi or rales.     Comments: Clear to auscultation bilaterally Abdominal:     General: Bowel sounds are normal.     Palpations: Abdomen is soft.     Tenderness: There is no abdominal tenderness. There is right CVA tenderness. There is no left CVA tenderness, guarding or rebound.  Musculoskeletal:     Cervical back: No tenderness or bony tenderness.     Thoracic back: Tenderness present. No bony tenderness.     Lumbar back: No tenderness or bony tenderness. Negative right straight leg raise test and negative left straight leg raise test.  Psychiatric:        Behavior: Behavior is cooperative.      UC Treatments / Results  Labs (all labs ordered are listed, but only abnormal results are displayed) Labs Reviewed  POCT URINALYSIS DIPSTICK, ED / UC - Abnormal; Notable for the following components:      Result Value   Bilirubin Urine SMALL (*)    Ketones, ur 40 (*)    All other components within normal limits  POC URINE PREG, ED  CERVICOVAGINAL ANCILLARY ONLY    EKG   Radiology DG Abdomen 1 View  Result Date: 03/25/2023 CLINICAL DATA:  CVA tenderness EXAM: ABDOMEN - 1 VIEW COMPARISON:  X-ray abdomen 02/24/2006 FINDINGS: The bowel gas pattern is normal. Stool throughout the colon. Renal shadows not visualized due to overlying stool. No radio-opaque calculi or other significant radiographic abnormality are seen. IMPRESSION: 1. Nonobstructive bowel-gas pattern. 2. Stool throughout the colon-correlate for constipation. Electronically Signed   By: Tish Frederickson M.D.   On: 03/25/2023 19:14    Procedures Procedures (including critical  care time)  Medications Ordered in UC Medications - No data to display  Initial Impression / Assessment and Plan / UC Course  I have reviewed the triage vital signs and the nursing notes.  Pertinent labs & imaging results that were available during my care of the patient were reviewed by me and considered in my medical decision making (see chart for details).     Patient is well-appearing, afebrile, nontoxic, nontachycardic.  Vital signs and physical exam are reassuring with no indication for emergent evaluation or imaging.  UA did not show any evidence of infection.  KUB was obtained which showed constipation.  We discussed that this could be contributing to her symptoms and encouraged her to use MiraLAX in order to ensure she is having 1 easy to pass bowel movement per day.  We discussed that she can titrate this medication up or down based on her response.  She is to drink plenty of fluid.  We also discussed that her symptoms could be related to interstitial cystitis and she was encouraged to avoid bladder irritants.  She is to follow-up with her primary care if symptoms are improving as she may benefit from more extensive workup than what we have available in urgent care.  STI swab was collected given her lower abdominal discomfort and is pending.  Will defer treatment until results are available.  Discussed that if she has any worsening or changing symptoms she needs to be seen immediately.  Strict return precautions given.  Final Clinical Impressions(s) / UC Diagnoses   Final diagnoses:  Urinary frequency  Constipation, unspecified constipation type  Lower abdominal pain  Screening examination for STI  Discharge Instructions      Your urine did not show any evidence of infection.  Your x-ray did not show a kidney stone but did show significant stool.  I believe that your symptoms are partially related to constipation which puts pressure on your bladder.  Please start MiraLAX  daily.  Make sure that you drink plenty of fluid with this medication.  It can cause diarrhea so if you have loose stools please decrease the dose.  Avoid any bladder irritants including NSAIDs or caffeine.  Follow-up with your primary care if your symptoms or not improving as you may benefit from seeing a urologist and they can refer you.  If you have any worsening symptoms including abdominal pain, pelvic pain, fever, nausea, vomiting you should be seen immediately.     ED Prescriptions     Medication Sig Dispense Auth. Provider   polyethylene glycol (MIRALAX) 17 g packet Take 17 g by mouth daily as needed. 30 each Samuel Rittenhouse, Noberto Retort, PA-C      PDMP not reviewed this encounter.   Jeani Hawking, PA-C 03/25/23 1926

## 2023-03-25 NOTE — Discharge Instructions (Addendum)
Your urine did not show any evidence of infection.  Your x-ray did not show a kidney stone but did show significant stool.  I believe that your symptoms are partially related to constipation which puts pressure on your bladder.  Please start MiraLAX daily.  Make sure that you drink plenty of fluid with this medication.  It can cause diarrhea so if you have loose stools please decrease the dose.  Avoid any bladder irritants including NSAIDs or caffeine.  Follow-up with your primary care if your symptoms or not improving as you may benefit from seeing a urologist and they can refer you.  If you have any worsening symptoms including abdominal pain, pelvic pain, fever, nausea, vomiting you should be seen immediately.

## 2023-03-26 ENCOUNTER — Telehealth (HOSPITAL_COMMUNITY): Payer: Self-pay | Admitting: *Deleted

## 2023-03-26 LAB — CERVICOVAGINAL ANCILLARY ONLY
Bacterial Vaginitis (gardnerella): POSITIVE — AB
Candida Glabrata: NEGATIVE
Candida Vaginitis: NEGATIVE
Chlamydia: NEGATIVE
Comment: NEGATIVE
Comment: NEGATIVE
Comment: NEGATIVE
Comment: NEGATIVE
Comment: NEGATIVE
Comment: NORMAL
Neisseria Gonorrhea: NEGATIVE
Trichomonas: NEGATIVE

## 2023-03-26 MED ORDER — METRONIDAZOLE 500 MG PO TABS: 500.0000 mg | ORAL_TABLET | Freq: Two times a day (BID) | ORAL | 0 refills | Status: DC

## 2023-03-26 NOTE — Telephone Encounter (Signed)
Pt called she seen her results on Mychart and would like treated. [facility] uses [facility] on [address].

## 2023-03-26 NOTE — Telephone Encounter (Signed)
Patient contacted our clinic requesting metronidazole be sent to pharmacy after seeing positive bacterial vaginosis test results on MyChart.  This was sent to the pharmacy on file and we discussed that she is not to drink any alcohol on this medication due to associated Antabuse side effects.  All questions answered to patient satisfaction.

## 2023-04-04 ENCOUNTER — Ambulatory Visit: Payer: Self-pay

## 2023-04-04 NOTE — Patient Outreach (Signed)
  Care Coordination   Initial Visit Note   04/04/2023 Name: Mikayla Sweeney MRN: [id number] DOB: 08/13/1961  Mikayla Sweeney is a 62 y.o. year old female who sees Mikayla Sweeney, Mikayla Keen, NP for primary care. I spoke with  Mikayla Sweeney by phone today.  What matters to the patients health and wellness today?  Patient unable to complete today's call, requested SW call on Monday 4/29    SDOH assessments and interventions completed:  No     Care Coordination Interventions:  Yes, provided   Interventions Today    Flowsheet Row Most Recent Value  General Interventions   General Interventions Discussed/Reviewed --  [scheduled initial call]        Follow up plan:  SW will follow up with the patient on 4/29    Encounter Outcome:  Pt. Scheduled   Mikayla Sweeney, BSW, CDP Social Worker, Certified Dementia Practitioner [facility] Care Management  Care Coordination [phone]

## 2023-04-08 ENCOUNTER — Telehealth: Payer: Self-pay

## 2023-04-08 NOTE — Patient Outreach (Signed)
  Care Coordination   04/08/2023 Name: Mikayla Sweeney MRN: [id number] DOB: December 20, 1960   Care Coordination Outreach Attempts:  An unsuccessful telephone outreach was attempted for a scheduled appointment today.  Follow Up Plan:  Additional outreach attempts will be made to offer the patient care coordination information and services.   Encounter Outcome:  No Answer   Care Coordination Interventions:  No, not indicated    Bevelyn Ngo, BSW, CDP Social Worker, Certified Dementia Practitioner [facility] Care Management  Care Coordination [phone]

## 2023-04-12 ENCOUNTER — Telehealth: Payer: Self-pay

## 2023-04-12 NOTE — Patient Outreach (Signed)
  Care Coordination   04/12/2023 Name: Mikayla Sweeney MRN: [id number] DOB: 07/11/1961   Care Coordination Outreach Attempts:  A third unsuccessful outreach was attempted today to offer the patient with information about available care coordination services.  Follow Up Plan:  No further outreach attempts will be made at this time. We have been unable to contact the patient to offer or enroll patient in care coordination services  Encounter Outcome:  No Answer   Care Coordination Interventions:  No, not indicated    Bevelyn Ngo, BSW, CDP Social Worker, Certified Dementia Practitioner [facility] Care Management  Care Coordination [phone]

## 2023-04-25 ENCOUNTER — Encounter (HOSPITAL_COMMUNITY): Payer: Self-pay

## 2023-04-25 ENCOUNTER — Ambulatory Visit (HOSPITAL_COMMUNITY)
Admission: EM | Admit: 2023-04-25 | Discharge: 2023-04-25 | Disposition: A | Discharge status: Home / Self Care | Payer: 59 | Attending: Nurse Practitioner | Admitting: Nurse Practitioner

## 2023-04-25 DIAGNOSIS — I10 Essential (primary) hypertension: Secondary | ICD-10-CM | POA: Diagnosis not present

## 2023-04-25 DIAGNOSIS — R350 Frequency of micturition: Secondary | ICD-10-CM | POA: Insufficient documentation

## 2023-04-25 LAB — POCT URINALYSIS DIP (MANUAL ENTRY)
Bilirubin, UA: NEGATIVE
Blood, UA: NEGATIVE
Glucose, UA: NEGATIVE mg/dL
Ketones, POC UA: NEGATIVE mg/dL
Leukocytes, UA: NEGATIVE
Nitrite, UA: NEGATIVE
Protein Ur, POC: NEGATIVE mg/dL
Spec Grav, UA: 1.025 (ref 1.010–1.025)
Urobilinogen, UA: 0.2 E.U./dL
pH, UA: 5.5 (ref 5.0–8.0)

## 2023-04-25 MED ORDER — METRONIDAZOLE 500 MG PO TABS: 500.0000 mg | ORAL_TABLET | Freq: Two times a day (BID) | ORAL | 0 refills | Status: DC

## 2023-04-25 NOTE — ED Triage Notes (Signed)
Pt c/o bladder pressure, urinary frequency/urgency, fatigue, back pain, and nausea x2 days. States here a month ago with same sx's and dx'd with BV. Denies vaginal d/c and states she is not sexual active.

## 2023-04-25 NOTE — Discharge Instructions (Addendum)
Your urine test was completely normal.  I do not think that your symptoms are due to a urinary tract infection. You likely have a bacterial infection. This is not sexually transmitted! As we discussed, the cause may be due to the body wash that you use. Please change to a fragrance free products such as [employer]. If your symptoms resolve and do not recur after finishing the medications and changing your body wash then that was the cause of your frequent infections. However, if you change your body wash and experience symptoms again, you should follow-up with gynecology.  Testing for bacteria and yeast is pending. You will only be notified for positive results or if treatment needs to be changed. You may go online to MyChart and review your results.   Your blood pressure was elevated today.  Likely because of you not feeling well and not taking your medications.  Managing your hypertension is very important. Over time, hypertension can damage the arteries and decrease blood flow to parts of the body, including the brain, heart, and kidneys. Having untreated or uncontrolled hypertension can lead to heart attack, stroke, weakened blood vessels (aneurysm), heart failure, kidney damage, eye damage, memory/concentration problems and vascular dementia. Please monitor your blood pressure closely and take your medications as prescribed. Go to the ED immediately if you develop a severe headache, dizziness, sudden vision problems, confusion, experience unusual weakness or numbness, severe pain in your chest or abdomen, you vomit repeatedly or have trouble breathing.

## 2023-04-25 NOTE — ED Provider Notes (Signed)
[facility]    CSN: [id number] Arrival date & time: 04/25/23  1830      History   Chief Complaint Chief Complaint  Patient presents with   Urinary Tract Infection    HPI Mikayla Sweeney is a 62 y.o. female.   Subjective:  Mikayla Sweeney is a 62 y.o. female who complains of urinary frequency, nausea, low back pain, fatigue, foul smelling urine, darker colored urine, lower abdominal pressure and headache for 3 days. Patient denies dysuria, hematuria, fevers, chills, vomiting, vaginal discharge, blurred vision, chest pain, shortness of breath or palpitations.  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  She denies any sexual activity.  She is postmenopausal.  She tried oil of oregano and nausea meds this morning without much relief in her symptoms.  Patient reports that her current symptoms are similar to her prior BV infections. Patient reports using scented soaps to cleanse with.   The following portions of the patient's history were reviewed and updated as appropriate: allergies, current medications, past family history, past medical history, past social history, past surgical history, and problem list.     Past Medical History:  Diagnosis Date   Anxiety    Asthma    B12 deficiency    Bipolar 1 disorder (HCC)    CHF (congestive heart failure) (HCC)    Epilepsy (HCC)    Hypertension    Sarcoidosis     Patient Active Problem List   Diagnosis Date Noted   Acute diastolic heart failure (HCC) 07/27/2014   Pulmonary edema 07/26/2014   Essential hypertension 07/26/2014   Sarcoidosis 07/26/2014   Bipolar 1 disorder (HCC) 07/26/2014   Headache 07/26/2014    Past Surgical History:  Procedure Laterality Date   TONSILLECTOMY     TUBAL LIGATION      OB History   No obstetric history on file.      Home Medications    Prior to Admission medications   Medication Sig Start Date End Date Taking? Authorizing Provider   albuterol (PROVENTIL HFA;VENTOLIN HFA) 108 (90 Base) MCG/ACT inhaler Inhale 1-2 puffs into the lungs every 6 (six) hours as needed. For wheeze or shortness of breath 12/14/18   Cathie Hoops, Amy V, PA-C  amLODipine (NORVASC) 10 MG tablet Take 1 tablet (10 mg total) by mouth daily. 09/04/22   [name], [name], NP  carvedilol (COREG) 25 MG tablet TAKE 1 TABLET BY MOUTH TWICE A DAY WITH FOOD 02/26/23   [name], [name], NP  fluticasone (FLONASE) 50 MCG/ACT nasal spray Place 2 sprays into both nostrils daily. 12/14/18   Cathie Hoops, Amy V, PA-C  furosemide (LASIX) 20 MG tablet Take 1 tablet (20 mg total) by mouth daily. 09/04/22   [name], [name], NP  hydrochlorothiazide (HYDRODIURIL) 25 MG tablet Take 1 tablet (25 mg total) by mouth daily. 09/04/22   [name], [name], NP  ibuprofen (ADVIL) 800 MG tablet Take 800 mg by mouth every 6 (six) hours as needed. 08/09/21   [provider]  ibuprofen (ADVIL,MOTRIN) 600 MG tablet Take 1 tablet (600 mg total) by mouth every 8 (eight) hours as needed. 09/02/18   [name], [name], NP  losartan (COZAAR) 100 MG tablet Take 1 tablet (100 mg total) by mouth daily. 09/04/22 12/03/22  [name], [name], NP  metroNIDAZOLE (FLAGYL) 500 MG tablet Take 1 tablet (500 mg total) by mouth 2 (two) times daily. 04/25/23   Lurline Idol, FNP  ondansetron (ZOFRAN-ODT) 8 MG disintegrating tablet TAKE 1 TABLET  BY MOUTH EVERY 8 HOURS AS NEEDED FOR NAUSEA OR VOMITING. 01/22/23   [name], [name], NP  polyethylene glycol (MIRALAX) 17 g packet Take 17 g by mouth daily as needed. 03/25/23   Raspet, Noberto Retort, PA-C  promethazine-dextromethorphan (PROMETHAZINE-DM) 6.25-15 MG/5ML syrup Take 5 mLs by mouth at bedtime as needed for cough. 12/02/22   Carlisle Beers, FNP  rosuvastatin (CRESTOR) 40 MG tablet Take 1 tablet (40 mg total) by mouth daily. 09/04/22   [name], [name], NP  tirzepatide [facility]) 10 MG/0.5ML Pen Inject 10 mg into the skin once a week.    [provider]  traZODone (DESYREL) 50 MG tablet TAKE 1  TABLET BY MOUTH AT BEDTIME AS NEEDED FOR SLEEP 05/22/22   [name], [name], NP    Family History Family History  Problem Relation Age of Onset   Pancreatic cancer Mother    Hypertension Mother    Diabetes Mother    Heart disease Father    Gout Father    Cirrhosis Father    Heart disease Sister        CHF    Social History Social History   Tobacco Use   Smoking status: Former   Smokeless tobacco: Never  Building services engineer Use: Never used  Substance Use Topics   Alcohol use: No   Drug use: No     Allergies   Patient has no known allergies.   Review of Systems Review of Systems  Constitutional:  Negative for fever.  Eyes:  Negative for visual disturbance.  Respiratory:  Negative for cough and shortness of breath.   Cardiovascular:  Negative for chest pain, palpitations and leg swelling.  Gastrointestinal:  Positive for nausea. Negative for vomiting.  Genitourinary:  Positive for frequency. Negative for dysuria, flank pain and vaginal discharge.  Musculoskeletal:  Positive for back pain.  Neurological:  Positive for headaches. Negative for dizziness.  All other systems reviewed and are negative.    Physical Exam Triage Vital Signs ED Triage Vitals  Enc Vitals Group     BP 04/25/23 2007 (!) 188/83     Pulse Rate 04/25/23 2007 65     Resp 04/25/23 2007 18     Temp 04/25/23 2007 97.8 F (36.6 C)     Temp Source 04/25/23 2007 Oral     SpO2 04/25/23 2007 97 %     Weight --      Height --      Head Circumference --      Peak Flow --      Pain Score 04/25/23 2008 7     Pain Loc --      Pain Edu? --      Excl. in GC? --    No data found.  Updated Vital Signs BP (!) 188/83 (BP Location: Right Arm)   Pulse 65   Temp 97.8 F (36.6 C) (Oral)   Resp 18   LMP 12/10/2014   SpO2 97%   Visual Acuity Right Eye Distance:   Left Eye Distance:   Bilateral Distance:    Right Eye Near:   Left Eye Near:    Bilateral Near:     Physical Exam Constitutional:       General: She is not in acute distress.    Appearance: Normal appearance. She is not ill-appearing or toxic-appearing.  HENT:     Head: Normocephalic.     Mouth/Throat:     Mouth: Mucous membranes are moist.  Eyes:     Extraocular  Movements: Extraocular movements intact.     Conjunctiva/sclera: Conjunctivae normal.     Pupils: Pupils are equal, round, and reactive to light.  Cardiovascular:     Rate and Rhythm: Normal rate and regular rhythm.  Pulmonary:     Effort: Pulmonary effort is normal.     Breath sounds: Normal breath sounds.  Abdominal:     Palpations: Abdomen is soft.     Tenderness: There is no right CVA tenderness or left CVA tenderness.  Genitourinary:    Comments: Deferred; patient performed self-swab for testing  Musculoskeletal:        General: Normal range of motion.     Cervical back: Normal range of motion and neck supple.  Skin:    General: Skin is warm and dry.  Neurological:     General: No focal deficit present.     Mental Status: She is alert and oriented to person, place, and time.  Psychiatric:        Mood and Affect: Mood normal.        Behavior: Behavior normal.        Thought Content: Thought content normal.        Judgment: Judgment normal.      UC Treatments / Results  Labs (all labs ordered are listed, but only abnormal results are displayed) Labs Reviewed  POCT URINALYSIS DIP (MANUAL ENTRY)  CERVICOVAGINAL ANCILLARY ONLY    EKG   Radiology No results found.  Procedures Procedures (including critical care time)  Medications Ordered in UC Medications - No data to display  Initial Impression / Assessment and Plan / UC Course  I have reviewed the triage vital signs and the nursing notes.  Pertinent labs & imaging results that were available during my care of the patient were reviewed by me and considered in my medical decision making (see chart for details).    62 y.o. female who complains of urinary frequency, nausea,  low back pain, fatigue, foul smelling urine, darker colored urine, lower abdominal pressure and headache.  She has a history of recurrent BV.  Denies sexual activity.  She is postmenopausal.  Symptoms similar to prior BV infections.  Patient is afebrile.  Nontoxic.  Urinalysis unremarkable.  Patient reports using scented soaps to cleanse with.  I suspect that her symptoms is due to recurrent BV due to scented soap use.  Will empirically treat with Flagyl.  Advised patient to use fragrance free soaps and monitor for any recurrence of symptoms.  If her symptoms should recur after treatment and changing of soaps, she should follow-up with gynecology.  Additionally, patient was noted to be hypertensive in the clinic.  She does have a history of hypertension and is on five different antihypertensive therapies.  Patient reports typical compliance with these medications but admits to not taking them today.  Patient advised to monitor blood pressure closely and to take blood pressure medications as soon as she gets home.  ED precautions reviewed.  Today's evaluation has revealed no signs of a dangerous process. Discussed diagnosis with patient and/or guardian. Patient and/or guardian aware of their diagnosis, possible red flag symptoms to watch out for and need for close follow up. Patient and/or guardian understands verbal and written discharge instructions. Patient and/or guardian comfortable with plan and disposition.  Patient and/or guardian has a clear mental status at this time, good insight into illness (after discussion and teaching) and has clear judgment to make decisions regarding their care  Documentation was completed with the aid  of voice recognition software. Transcription may contain typographical errors. Final Clinical Impressions(s) / UC Diagnoses   Final diagnoses:  Urinary frequency  Elevated blood pressure reading with diagnosis of hypertension     Discharge Instructions      Your urine  test was completely normal.  I do not think that your symptoms are due to a urinary tract infection. You likely have a bacterial infection. This is not sexually transmitted! As we discussed, the cause may be due to the body wash that you use. Please change to a fragrance free products such as [employer] Aeronautical [employer]. If your symptoms resolve and do not recur after finishing the medications and changing your body wash then that was the cause of your frequent infections. However, if you change your body wash and experience symptoms again, you should follow-up with gynecology.  Testing for bacteria and yeast is pending. You will only be notified for positive results or if treatment needs to be changed. You may go online to MyChart and review your results.   Your blood pressure was elevated today.  Likely because of you not feeling well and not taking your medications.  Managing your hypertension is very important. Over time, hypertension can damage the arteries and decrease blood flow to parts of the body, including the brain, heart, and kidneys. Having untreated or uncontrolled hypertension can lead to heart attack, stroke, weakened blood vessels (aneurysm), heart failure, kidney damage, eye damage, memory/concentration problems and vascular dementia. Please monitor your blood pressure closely and take your medications as prescribed. Go to the ED immediately if you develop a severe headache, dizziness, sudden vision problems, confusion, experience unusual weakness or numbness, severe pain in your chest or abdomen, you vomit repeatedly or have trouble breathing.      ED Prescriptions     Medication Sig Dispense Auth. Provider   metroNIDAZOLE (FLAGYL) 500 MG tablet Take 1 tablet (500 mg total) by mouth 2 (two) times daily. 14 tablet Lurline Idol, FNP      PDMP not reviewed this encounter.   Lurline Idol, [place] 04/25/23 2103

## 2023-04-26 LAB — CERVICOVAGINAL ANCILLARY ONLY
Bacterial Vaginitis (gardnerella): POSITIVE — AB
Candida Glabrata: NEGATIVE
Candida Vaginitis: NEGATIVE
Comment: NEGATIVE
Comment: NEGATIVE
Comment: NEGATIVE

## 2023-04-29 ENCOUNTER — Telehealth: Payer: Self-pay

## 2023-04-29 DIAGNOSIS — E569 Vitamin deficiency, unspecified: Secondary | ICD-10-CM | POA: Diagnosis not present

## 2023-04-29 DIAGNOSIS — E349 Endocrine disorder, unspecified: Secondary | ICD-10-CM | POA: Diagnosis not present

## 2023-05-09 ENCOUNTER — Encounter: Payer: Self-pay | Admitting: Internal Medicine

## 2023-05-09 ENCOUNTER — Other Ambulatory Visit (HOSPITAL_COMMUNITY)
Admission: RE | Admit: 2023-05-09 | Discharge: 2023-05-09 | Disposition: A | Discharge status: Home / Self Care | Payer: 59 | Source: Ambulatory Visit | Attending: Internal Medicine | Admitting: Internal Medicine

## 2023-05-09 ENCOUNTER — Ambulatory Visit (INDEPENDENT_AMBULATORY_CARE_PROVIDER_SITE_OTHER): Payer: 59 | Admitting: Internal Medicine

## 2023-05-09 VITALS — BP 130/84 | HR 75 | Temp 98.4°F | Wt 201.8 lb

## 2023-05-09 DIAGNOSIS — N898 Other specified noninflammatory disorders of vagina: Secondary | ICD-10-CM | POA: Insufficient documentation

## 2023-05-09 DIAGNOSIS — R300 Dysuria: Secondary | ICD-10-CM | POA: Diagnosis not present

## 2023-05-09 LAB — POC URINALSYSI DIPSTICK (AUTOMATED)
Bilirubin, UA: NEGATIVE
Blood, UA: NEGATIVE
Glucose, UA: NEGATIVE
Ketones, UA: NEGATIVE
Leukocytes, UA: NEGATIVE
Nitrite, UA: NEGATIVE
Protein, UA: POSITIVE — AB
Spec Grav, UA: 1.025 (ref 1.010–1.025)
Urobilinogen, UA: 0.2 E.U./dL
pH, UA: 6 (ref 5.0–8.0)

## 2023-05-09 MED ORDER — METRONIDAZOLE 500 MG PO TABS: 2000.0000 mg | ORAL_TABLET | Freq: Once | ORAL | 0 refills | Status: AC

## 2023-05-09 NOTE — Progress Notes (Signed)
Established Patient Office Visit     CC/Reason for Visit: Vaginal itching, discharge  HPI: Mikayla Sweeney is a 62 y.o. female who is coming in today for the above mentioned reasons.  She has a history of recurrent BV.  She presents today with urinary frequency, suprapubic pain, nausea and vaginal discharge and itching.  She was seen in urgent care on 5/14.  Given her history of recurrent bacterial vaginosis she was empirically prescribed Flagyl.  She notes a period of initial improvement but again is having same symptoms.   Past Medical/Surgical History: Past Medical History:  Diagnosis Date   Anxiety    Asthma    B12 deficiency    Bipolar 1 disorder (HCC)    CHF (congestive heart failure) (HCC)    Epilepsy (HCC)    Hypertension    Sarcoidosis     Past Surgical History:  Procedure Laterality Date   TONSILLECTOMY     TUBAL LIGATION      Social History:  reports that she has quit smoking. She has never used smokeless tobacco. She reports that she does not drink alcohol and does not use drugs.  Allergies: No Known Allergies  Family History:  Family History  Problem Relation Age of Onset   Pancreatic cancer Mother    Hypertension Mother    Diabetes Mother    Heart disease Father    Gout Father    Cirrhosis Father    Heart disease Sister        CHF     Current Outpatient Medications:    albuterol (PROVENTIL HFA;VENTOLIN HFA) 108 (90 Base) MCG/ACT inhaler, Inhale 1-2 puffs into the lungs every 6 (six) hours as needed. For wheeze or shortness of breath, Disp: 1 Inhaler, Rfl: 0   amLODipine (NORVASC) 10 MG tablet, Take 1 tablet (10 mg total) by mouth daily., Disp: 90 tablet, Rfl: 3   carvedilol (COREG) 25 MG tablet, TAKE 1 TABLET BY MOUTH TWICE A DAY WITH FOOD, Disp: 180 tablet, Rfl: 1   fluticasone (FLONASE) 50 MCG/ACT nasal spray, Place 2 sprays into both nostrils daily., Disp: 1 g, Rfl: 0   furosemide (LASIX) 20 MG tablet, Take 1 tablet (20 mg total) by mouth  daily., Disp: 90 tablet, Rfl: 3   hydrochlorothiazide (HYDRODIURIL) 25 MG tablet, Take 1 tablet (25 mg total) by mouth daily., Disp: 90 tablet, Rfl: 3   ibuprofen (ADVIL) 800 MG tablet, Take 800 mg by mouth every 6 (six) hours as needed., Disp: , Rfl:    ibuprofen (ADVIL,MOTRIN) 600 MG tablet, Take 1 tablet (600 mg total) by mouth every 8 (eight) hours as needed., Disp: 30 tablet, Rfl: 0   metroNIDAZOLE (FLAGYL) 500 MG tablet, Take 4 tablets (2,000 mg total) by mouth once for 1 dose., Disp: 4 tablet, Rfl: 0   ondansetron (ZOFRAN-ODT) 8 MG disintegrating tablet, TAKE 1 TABLET BY MOUTH EVERY 8 HOURS AS NEEDED FOR NAUSEA OR VOMITING., Disp: 60 tablet, Rfl: 0   polyethylene glycol (MIRALAX) 17 g packet, Take 17 g by mouth daily as needed., Disp: 30 each, Rfl: 0   promethazine-dextromethorphan (PROMETHAZINE-DM) 6.25-15 MG/5ML syrup, Take 5 mLs by mouth at bedtime as needed for cough., Disp: 118 mL, Rfl: 0   rosuvastatin (CRESTOR) 40 MG tablet, Take 1 tablet (40 mg total) by mouth daily., Disp: 90 tablet, Rfl: 3   tirzepatide (MOUNJARO) 10 MG/0.5ML Pen, Inject 10 mg into the skin once a week., Disp: , Rfl:    traZODone (DESYREL) 50 MG  tablet, TAKE 1 TABLET BY MOUTH AT BEDTIME AS NEEDED FOR SLEEP, Disp: 90 tablet, Rfl: 0   losartan (COZAAR) 100 MG tablet, Take 1 tablet (100 mg total) by mouth daily., Disp: 90 tablet, Rfl: 3  Review of Systems:  Negative unless indicated in HPI.   Physical Exam: Vitals:   05/09/23 1143  BP: 130/84  Pulse: 75  Temp: 98.4 F (36.9 C)  TempSrc: Oral  SpO2: 95%  Weight: 201 lb 12.8 oz (91.5 kg)    Body mass index is 36.91 kg/m.   Physical Exam Vitals reviewed. Exam conducted with a chaperone present.  Constitutional:      Appearance: Normal appearance.  HENT:     Head: Normocephalic and atraumatic.  Eyes:     Conjunctiva/sclera: Conjunctivae normal.     Pupils: Pupils are equal, round, and reactive to light.  Genitourinary:    General: Normal vulva.      Vagina: Vaginal discharge present.  Skin:    General: Skin is warm and dry.  Neurological:     General: No focal deficit present.     Mental Status: She is alert and oriented to person, place, and time.  Psychiatric:        Mood and Affect: Mood normal.        Behavior: Behavior normal.        Thought Content: Thought content normal.        Judgment: Judgment normal.      Impression and Plan:  Vaginal itching -     metroNIDAZOLE; Take 4 tablets (2,000 mg total) by mouth once for 1 dose.  Dispense: 4 tablet; Refill: 0  Dysuria -     POCT Urinalysis Dipstick (Automated)  -In office urine dipstick is negative for nitrate, leukocytes, bacteria, hence unlikely UTI. -She does have a watery white discharge on exam today, no vaginal or vulvar erythema.  Given her history of recurring BV likely the same, wet prep has been sent.  Sent Flagyl 2000 mg x 1 empirically while we wait for results.  GYN appointment is pending.   Time spent:31 minutes reviewing chart, interviewing and examining patient and formulating plan of care.     [name], MD [facility]

## 2023-05-09 NOTE — Addendum Note (Signed)
Addended by: Kern Reap B on: 05/09/2023 01:08 PM   Modules accepted: Orders

## 2023-05-13 LAB — CERVICOVAGINAL ANCILLARY ONLY
Bacterial Vaginitis (gardnerella): POSITIVE — AB
Candida Glabrata: NEGATIVE
Candida Vaginitis: NEGATIVE
Chlamydia: NEGATIVE
Comment: NEGATIVE
Comment: NEGATIVE
Comment: NEGATIVE
Comment: NEGATIVE
Comment: NEGATIVE
Comment: NORMAL
Neisseria Gonorrhea: NEGATIVE
Trichomonas: NEGATIVE

## 2023-05-21 ENCOUNTER — Ambulatory Visit (AMBULATORY_SURGERY_CENTER): Payer: 59

## 2023-05-21 ENCOUNTER — Encounter: Payer: Self-pay | Admitting: Gastroenterology

## 2023-05-21 VITALS — Ht 62.0 in | Wt 199.0 lb

## 2023-05-21 DIAGNOSIS — Z1211 Encounter for screening for malignant neoplasm of colon: Secondary | ICD-10-CM

## 2023-05-21 MED ORDER — PEG 3350-KCL-NA BICARB-NACL 420 G PO SOLR: 4000.0000 mL | Freq: Once | ORAL | 0 refills | Status: AC

## 2023-05-21 NOTE — Progress Notes (Signed)
No egg or soy allergy known to patient  No issues known to pt with past sedation with any surgeries or procedures Patient denies ever being told they had issues or difficulty with intubation  No FH of Malignant Hyperthermia Pt is not on diet pills Pt is not on  home 02  Pt is not on blood thinners  Pt reports constipation from time to time instructions given for extra miralax  No A fib or A flutter Have any cardiac testing pending--no  Level of assistance: independent   PV completed. Prep instructions reviewed with patient. Instructions sent via mychart and to address on file.

## 2023-06-10 ENCOUNTER — Encounter: Payer: Self-pay | Admitting: Gastroenterology

## 2023-06-10 ENCOUNTER — Ambulatory Visit (AMBULATORY_SURGERY_CENTER): Payer: 59 | Admitting: Gastroenterology

## 2023-06-10 VITALS — BP 147/77 | HR 68 | Temp 97.5°F | Resp 16 | Ht 62.0 in | Wt 200.4 lb

## 2023-06-10 DIAGNOSIS — D123 Benign neoplasm of transverse colon: Secondary | ICD-10-CM | POA: Diagnosis not present

## 2023-06-10 DIAGNOSIS — I509 Heart failure, unspecified: Secondary | ICD-10-CM | POA: Diagnosis not present

## 2023-06-10 DIAGNOSIS — I10 Essential (primary) hypertension: Secondary | ICD-10-CM | POA: Diagnosis not present

## 2023-06-10 DIAGNOSIS — Z1211 Encounter for screening for malignant neoplasm of colon: Secondary | ICD-10-CM | POA: Diagnosis not present

## 2023-06-10 DIAGNOSIS — K641 Second degree hemorrhoids: Secondary | ICD-10-CM | POA: Diagnosis not present

## 2023-06-10 DIAGNOSIS — D120 Benign neoplasm of cecum: Secondary | ICD-10-CM

## 2023-06-10 DIAGNOSIS — K5730 Diverticulosis of large intestine without perforation or abscess without bleeding: Secondary | ICD-10-CM | POA: Diagnosis not present

## 2023-06-10 DIAGNOSIS — D125 Benign neoplasm of sigmoid colon: Secondary | ICD-10-CM

## 2023-06-10 HISTORY — PX: COLONOSCOPY WITH PROPOFOL: SHX5780

## 2023-06-10 MED ORDER — SODIUM CHLORIDE 0.9 % IV SOLN: 500.0000 mL | Freq: Once | INTRAVENOUS | Status: DC

## 2023-06-10 NOTE — Op Note (Signed)
[facility] Patient Name: Mikayla Sweeney Procedure Date: 06/10/2023 2:57 PM MRN: [id number] Endoscopist: Doristine Locks , MD, [id number] Age: 62 Referring MD:  Date of Birth: [birth year] Gender: Female Account #: [id number] Procedure:                Colonoscopy Indications:              Screening for colorectal malignant neoplasm (last                            colonoscopy was more than 10 years ago) Medicines:                Monitored Anesthesia Care Procedure:                Pre-Anesthesia Assessment:                           - Prior to the procedure, a History and Physical                            was performed, and patient medications and                            allergies were reviewed. The patient's tolerance of                            previous anesthesia was also reviewed. The risks                            and benefits of the procedure and the sedation                            options and risks were discussed with the patient.                            All questions were answered, and informed consent                            was obtained. Prior Anticoagulants: The patient has                            taken no anticoagulant or antiplatelet agents. ASA                            Grade Assessment: II - A patient with mild systemic                            disease. After reviewing the risks and benefits,                            the patient was deemed in satisfactory condition to                            undergo the procedure.  After obtaining informed consent, the colonoscope                            was passed under direct vision. Throughout the                            procedure, the patient's blood pressure, pulse, and                            oxygen saturations were monitored continuously. The                            CF [id number] #[id number] was introduced through the anus                            and advanced to  the the terminal ileum. The                            colonoscopy was performed without difficulty. The                            patient tolerated the procedure well. The quality                            of the bowel preparation was good. The terminal                            ileum, ileocecal valve, appendiceal orifice, and                            rectum were photographed. Scope In: 3:04:48 PM Scope Out: 3:44:41 PM Scope Withdrawal Time: 0 hours 37 minutes 30 seconds  Total Procedure Duration: 0 hours 39 minutes 53 seconds  Findings:                 The perianal and digital rectal examinations were                            normal.                           Two sessile polyps were found in the transverse                            colon and cecum. The polyps were 2 to 6 mm in size.                            These polyps were removed with a cold snare.                            Resection and retrieval were complete. Estimated                            blood loss was minimal.  A 20 mm polyp was found in the proximal sigmoid                            colon. The polyp was sessile. The polyp was removed                            with a hot snare in piecemeal fashion. After                            complete resection, the edges were further avulsed                            using a cold snare, then ablated using the snare                            tip. Resection and retrieval were complete. Area 3                            cm distal to the polypectomy site was tattooed with                            an injection of 2 mL of Spot (carbon black) on the                            contralateral wall. Estimated blood loss was                            minimal.                           A few medium-mouthed diverticula were found in the                            ascending colon.                           Non-bleeding internal hemorrhoids were found  during                            retroflexion. The hemorrhoids were small.                           The terminal ileum appeared normal. Complications:            No immediate complications. Estimated Blood Loss:     Estimated blood loss was minimal. Impression:               - Two 2 to 6 mm polyps in the transverse colon and                            in the cecum, removed with a cold snare. Resected                            and retrieved.                           -  One 20 mm polyp in the proximal sigmoid colon,                            removed with a hot snare. Resected and retrieved.                            Tattooed.                           - Diverticulosis in the ascending colon.                           - Non-bleeding internal hemorrhoids.                           - The examined portion of the ileum was normal. Recommendation:           - Patient has a contact number available for                            emergencies. The signs and symptoms of potential                            delayed complications were discussed with the                            patient. Return to normal activities tomorrow.                            Written discharge instructions were provided to the                            patient.                           - Resume previous diet.                           - Continue present medications.                           - Await pathology results.                           - Repeat colonoscopy in 6 months for surveillance                            after piecemeal polypectomy.                           - Return to GI office PRN. Doristine Locks, MD 06/10/2023 4:09:27 PM

## 2023-06-10 NOTE — Patient Instructions (Signed)
Please read handouts provided. Continue present medications Await pathology results. Repeat colonoscopy in 6 months for screening. Return to GI office as needed.   YOU HAD AN ENDOSCOPIC PROCEDURE TODAY AT THE [facility]:   Refer to the procedure report that was given to you for any specific questions about what was found during the examination.  If the procedure report does not answer your questions, please call your gastroenterologist to clarify.  If you requested that your care partner not be given the details of your procedure findings, then the procedure report has been included in a sealed envelope for you to review at your convenience later.  YOU SHOULD EXPECT: Some feelings of bloating in the abdomen. Passage of more gas than usual.  Walking can help get rid of the air that was put into your GI tract during the procedure and reduce the bloating. If you had a lower endoscopy (such as a colonoscopy or flexible sigmoidoscopy) you may notice spotting of blood in your stool or on the toilet paper. If you underwent a bowel prep for your procedure, you may not have a normal bowel movement for a few days.  Please Note:  You might notice some irritation and congestion in your nose or some drainage.  This is from the oxygen used during your procedure.  There is no need for concern and it should clear up in a day or so.  SYMPTOMS TO REPORT IMMEDIATELY:  Following lower endoscopy (colonoscopy or flexible sigmoidoscopy):  Excessive amounts of blood in the stool  Significant tenderness or worsening of abdominal pains  Swelling of the abdomen that is new, acute  Fever of 100F or higher  For urgent or emergent issues, a gastroenterologist can be reached at any hour by calling [phone]. Do not use MyChart messaging for urgent concerns.    DIET:  We do recommend a small meal at first, but then you may proceed to your regular diet.  Drink plenty of fluids but you should avoid  alcoholic beverages for 24 hours.  ACTIVITY:  You should plan to take it easy for the rest of today and you should NOT DRIVE or use heavy machinery until tomorrow (because of the sedation medicines used during the test).    FOLLOW UP: Our staff will call the number listed on your records the next business day following your procedure.  We will call around 7:15- 8:00 am to check on you and address any questions or concerns that you may have regarding the information given to you following your procedure. If we do not reach you, we will leave a message.     If any biopsies were taken you will be contacted by phone or by letter within the next 1-3 weeks.  Please call us at [phone] if you have not heard about the biopsies in 3 weeks.    SIGNATURES/CONFIDENTIALITY: You and/or your care partner have signed paperwork which will be entered into your electronic medical record.  These signatures attest to the fact that that the information above on your After Visit Summary has been reviewed and is understood.  Full responsibility of the confidentiality of this discharge information lies with you and/or your care-partner.

## 2023-06-10 NOTE — Progress Notes (Signed)
Called to room to assist during endoscopic procedure.  Patient ID and intended procedure confirmed with present staff. Received instructions for my participation in the procedure from the performing physician.  

## 2023-06-10 NOTE — Progress Notes (Signed)
Pt's states no medical or surgical changes since previsit or office visit. VS assessed by D.T 

## 2023-06-10 NOTE — Progress Notes (Signed)
GASTROENTEROLOGY PROCEDURE H&P NOTE   Primary Care Physician: Shirline Frees, NP    Reason for Procedure:  Colon Cancer screening  Plan:    Colonoscopy  Patient is appropriate for endoscopic procedure(s) in the ambulatory ([facility]) setting.  The nature of the procedure, as well as the risks, benefits, and alternatives were carefully and thoroughly reviewed with the patient. Ample time for discussion and questions allowed. The patient understood, was satisfied, and agreed to proceed.     HPI: Mikayla Sweeney is a 62 y.o. female who presents for colonoscopy for routine Colon Cancer screening.  No active GI symptoms.  No known family history of colon cancer or related malignancy.    Last colonoscopy was >10 years ago.   Past Medical History:  Diagnosis Date   Anxiety    Asthma    B12 deficiency    Bipolar 1 disorder (HCC)    CHF (congestive heart failure) (HCC)    Epilepsy (HCC)    Hypertension    Sarcoidosis     Past Surgical History:  Procedure Laterality Date   TONSILLECTOMY     TUBAL LIGATION      Prior to Admission medications   Medication Sig Start Date End Date Taking? Authorizing Provider  amLODipine (NORVASC) 10 MG tablet Take 1 tablet (10 mg total) by mouth daily. 09/04/22  Yes [name], [name], NP  carvedilol (COREG) 25 MG tablet TAKE 1 TABLET BY MOUTH TWICE A DAY WITH FOOD 02/26/23  Yes [name], [name], NP  hydrochlorothiazide (HYDRODIURIL) 25 MG tablet Take 1 tablet (25 mg total) by mouth daily. 09/04/22  Yes [name], [name], NP  losartan (COZAAR) 100 MG tablet Take 1 tablet (100 mg total) by mouth daily. 09/04/22 06/10/23 Yes [name], [name], NP  rosuvastatin (CRESTOR) 40 MG tablet Take 1 tablet (40 mg total) by mouth daily. 09/04/22  Yes [name], [name], NP  albuterol (PROVENTIL HFA;VENTOLIN HFA) 108 (90 Base) MCG/ACT inhaler Inhale 1-2 puffs into the lungs every 6 (six) hours as needed. For wheeze or shortness of breath Patient not taking: Reported on 05/21/2023 12/14/18    Belinda Fisher, PA-C  fluticasone [facility]) 50 MCG/ACT nasal spray Place 2 sprays into both nostrils daily. Patient not taking: Reported on 05/21/2023 12/14/18   Belinda Fisher, PA-C  furosemide (LASIX) 20 MG tablet Take 1 tablet (20 mg total) by mouth daily. Patient not taking: Reported on 06/10/2023 09/04/22   Shirline Frees, NP  ibuprofen (ADVIL) 800 MG tablet Take 800 mg by mouth every 6 (six) hours as needed. Patient not taking: Reported on 05/21/2023 08/09/21   [provider]  ibuprofen (ADVIL,MOTRIN) 600 MG tablet Take 1 tablet (600 mg total) by mouth every 8 (eight) hours as needed. Patient not taking: Reported on 05/21/2023 09/02/18   [name], [name], NP  ondansetron (ZOFRAN-ODT) 8 MG disintegrating tablet TAKE 1 TABLET BY MOUTH EVERY 8 HOURS AS NEEDED FOR NAUSEA OR VOMITING. Patient not taking: Reported on 06/10/2023 01/22/23   Shirline Frees, NP  polyethylene glycol (MIRALAX) 17 g packet Take 17 g by mouth daily as needed. Patient not taking: Reported on 06/10/2023 03/25/23   Raspet, Noberto Retort, PA-C  promethazine-dextromethorphan (PROMETHAZINE-DM) 6.25-15 MG/5ML syrup Take 5 mLs by mouth at bedtime as needed for cough. Patient not taking: Reported on 05/21/2023 12/02/22   Carlisle Beers, FNP  [facility] MANAGEMENT [place] Inject into the skin once a week.    [provider]  tirzepatide Greggory Keen) 10 MG/0.5ML Pen Inject 10 mg into the skin once a week.  Patient not taking: Reported on 05/21/2023    [provider]  traZODone (DESYREL) 50 MG tablet TAKE 1 TABLET BY MOUTH AT BEDTIME AS NEEDED FOR SLEEP Patient not taking: Reported on 05/21/2023 05/22/22   Shirline Frees, NP    Current Outpatient Medications  Medication Sig Dispense Refill   amLODipine (NORVASC) 10 MG tablet Take 1 tablet (10 mg total) by mouth daily. 90 tablet 3   carvedilol (COREG) 25 MG tablet TAKE 1 TABLET BY MOUTH TWICE A DAY WITH FOOD 180 tablet 1   hydrochlorothiazide (HYDRODIURIL) 25 MG tablet Take 1  tablet (25 mg total) by mouth daily. 90 tablet 3   losartan (COZAAR) 100 MG tablet Take 1 tablet (100 mg total) by mouth daily. 90 tablet 3   rosuvastatin (CRESTOR) 40 MG tablet Take 1 tablet (40 mg total) by mouth daily. 90 tablet 3   albuterol (PROVENTIL HFA;VENTOLIN HFA) 108 (90 Base) MCG/ACT inhaler Inhale 1-2 puffs into the lungs every 6 (six) hours as needed. For wheeze or shortness of breath (Patient not taking: Reported on 05/21/2023) 1 Inhaler 0   fluticasone (FLONASE) 50 MCG/ACT nasal spray Place 2 sprays into both nostrils daily. (Patient not taking: Reported on 05/21/2023) 1 g 0   furosemide (LASIX) 20 MG tablet Take 1 tablet (20 mg total) by mouth daily. (Patient not taking: Reported on 06/10/2023) 90 tablet 3   ibuprofen (ADVIL) 800 MG tablet Take 800 mg by mouth every 6 (six) hours as needed. (Patient not taking: Reported on 05/21/2023)     ibuprofen (ADVIL,MOTRIN) 600 MG tablet Take 1 tablet (600 mg total) by mouth every 8 (eight) hours as needed. (Patient not taking: Reported on 05/21/2023) 30 tablet 0   ondansetron (ZOFRAN-ODT) 8 MG disintegrating tablet TAKE 1 TABLET BY MOUTH EVERY 8 HOURS AS NEEDED FOR NAUSEA OR VOMITING. (Patient not taking: Reported on 06/10/2023) 60 tablet 0   polyethylene glycol (MIRALAX) 17 g packet Take 17 g by mouth daily as needed. (Patient not taking: Reported on 06/10/2023) 30 each 0   promethazine-dextromethorphan (PROMETHAZINE-DM) 6.25-15 MG/5ML syrup Take 5 mLs by mouth at bedtime as needed for cough. (Patient not taking: Reported on 05/21/2023) 118 mL 0   SEMAGLUTIDE-WEIGHT MANAGEMENT [place] Inject into the skin once a week.     tirzepatide (MOUNJARO) 10 MG/0.5ML Pen Inject 10 mg into the skin once a week. (Patient not taking: Reported on 05/21/2023)     traZODone (DESYREL) 50 MG tablet TAKE 1 TABLET BY MOUTH AT BEDTIME AS NEEDED FOR SLEEP (Patient not taking: Reported on 05/21/2023) 90 tablet 0   Current Facility-Administered Medications  Medication Dose Route  Frequency Provider Last Rate Last Admin   0.9 %  sodium chloride infusion  500 mL Intravenous Once Virgil Slinger V, DO        Allergies as of 06/10/2023   (No Known Allergies)    Family History  Problem Relation Age of Onset   Pancreatic cancer Mother    Hypertension Mother    Diabetes Mother    Heart disease Father    Gout Father    Cirrhosis Father    Heart disease Sister        CHF   Colon cancer Neg Hx    Esophageal cancer Neg Hx    Rectal cancer Neg Hx    Stomach cancer Neg Hx     Social History   Socioeconomic History   Marital status: Married    Spouse name: Not on file   Number of children:  Not on file   Years of education: Not on file   Highest education level: Not on file  Occupational History   Not on file  Tobacco Use   Smoking status: Former   Smokeless tobacco: Never  Vaping Use   Vaping Use: Never used  Substance and Sexual Activity   Alcohol use: No   Drug use: No   Sexual activity: Not Currently    Birth control/protection: None  Other Topics Concern   Not on file  Social History Narrative   She has her own business    Married    Three children    5 grandchildren       She likes to spend time with her grandchildren    Social Determinants of Health   Financial Resource Strain: Low Risk  (07/26/2022)   Overall Financial Resource Strain (CARDIA)    Difficulty of Paying Living Expenses: Not hard at all  Food Insecurity: No Food Insecurity (11/06/2022)   Hunger Vital Sign    Worried About Running Out of Food in the Last Year: Never true    Ran Out of Food in the Last Year: Never true  Transportation Needs: No Transportation Needs (11/06/2022)   PRAPARE - Administrator, Civil Service (Medical): No    Lack of Transportation (Non-Medical): No  Physical Activity: Sufficiently Active (07/26/2022)   Exercise Vital Sign    Days of Exercise per Week: 5 days    Minutes of Exercise per Session: 40 min  Stress: No Stress Concern  Present (07/26/2022)   [employer] of Occupational Health - Occupational Stress Questionnaire    Feeling of Stress : Not at all  Social Connections: Moderately Isolated (07/26/2022)   Social Connection and Isolation Panel [NHANES]    Frequency of Communication with Friends and Family: More than three times a week    Frequency of Social Gatherings with Friends and Family: More than three times a week    Attends Religious Services: Never    Database administrator or Organizations: Yes    Attends Banker Meetings: Never    Marital Status: Divorced  Catering manager Violence: Not At Risk (07/26/2022)   Humiliation, Afraid, Rape, and Kick questionnaire    Fear of Current or Ex-Partner: No    Emotionally Abused: No    Physically Abused: No    Sexually Abused: No    Physical Exam: Vital signs in last 24 hours: @BP  (!) 153/78   Pulse 68   Temp (!) 97.5 F (36.4 C) (Skin)   Ht 5\' 2"  (1.575 m)   Wt 200 lb 6.4 oz (90.9 kg)   LMP 12/10/2014   SpO2 99%   BMI 36.65 kg/m  GEN: NAD EYE: Sclerae anicteric ENT: MMM CV: Non-tachycardic Pulm: CTA b/l GI: Soft, NT/ND NEURO:  Alert & Oriented x 3   Doristine Locks, DO [facility]   06/10/2023 2:54 PM

## 2023-06-10 NOTE — Progress Notes (Signed)
Vss nad trans to pacu 

## 2023-06-11 ENCOUNTER — Telehealth: Payer: Self-pay

## 2023-06-11 NOTE — Telephone Encounter (Signed)
Attempted to reach patient for post-procedure f/u call. No answer. Left message for her to please not hesitate to call if she has any questions/concerns regarding her care. 

## 2023-06-16 ENCOUNTER — Other Ambulatory Visit: Payer: Self-pay

## 2023-06-16 ENCOUNTER — Encounter (HOSPITAL_COMMUNITY): Payer: Self-pay

## 2023-06-16 ENCOUNTER — Ambulatory Visit (HOSPITAL_COMMUNITY)
Admission: RE | Admit: 2023-06-16 | Discharge: 2023-06-16 | Disposition: A | Discharge status: Home / Self Care | Payer: 59 | Source: Ambulatory Visit | Attending: Internal Medicine | Admitting: Internal Medicine

## 2023-06-16 VITALS — BP 148/96 | HR 72 | Temp 98.1°F | Resp 18

## 2023-06-16 DIAGNOSIS — R350 Frequency of micturition: Secondary | ICD-10-CM | POA: Diagnosis not present

## 2023-06-16 DIAGNOSIS — R6883 Chills (without fever): Secondary | ICD-10-CM | POA: Diagnosis not present

## 2023-06-16 DIAGNOSIS — R109 Unspecified abdominal pain: Secondary | ICD-10-CM | POA: Insufficient documentation

## 2023-06-16 DIAGNOSIS — R110 Nausea: Secondary | ICD-10-CM | POA: Diagnosis not present

## 2023-06-16 LAB — POCT URINALYSIS DIP (MANUAL ENTRY)
Bilirubin, UA: NEGATIVE
Blood, UA: NEGATIVE
Glucose, UA: NEGATIVE mg/dL
Leukocytes, UA: NEGATIVE
Nitrite, UA: NEGATIVE
Protein Ur, POC: NEGATIVE mg/dL
Spec Grav, UA: >=1.03 — AB (ref 1.010–1.025)
Urobilinogen, UA: 0.2 E.U./dL
pH, UA: 5.5 (ref 5.0–8.0)

## 2023-06-16 MED ORDER — METRONIDAZOLE 500 MG PO TABS: 500.0000 mg | ORAL_TABLET | Freq: Two times a day (BID) | ORAL | 0 refills | Status: DC

## 2023-06-16 MED ORDER — ONDANSETRON 4 MG PO TBDP
ORAL_TABLET | ORAL | Status: AC
  Filled 2023-06-16: qty 1

## 2023-06-16 MED ORDER — ONDANSETRON 4 MG PO TBDP
4.0000 mg | ORAL_TABLET | Freq: Once | ORAL | Status: AC
  Administered 2023-06-16: 4 mg via ORAL

## 2023-06-16 MED ORDER — ONDANSETRON 4 MG PO TBDP: 4.0000 mg | ORAL_TABLET | Freq: Three times a day (TID) | ORAL | 0 refills | Status: DC | PRN

## 2023-06-16 NOTE — ED Triage Notes (Signed)
Pt reports she has felt bad since Monday after her colonoscopy . Pt has had ABD pain,back pain, chills and nausea. Pt thinks she has a UTI.

## 2023-06-16 NOTE — Discharge Instructions (Addendum)
Take flagyl twice a day for 7 days for suspected BV infection. I have sent your urine for culture and will call you if you have a UTI.  The vaginal swab is pending and staff will call you if we need to switch the treatment plan based on the results.  Zofran as needed every 8 hours for nausea.  Continue drinking plenty of fluids to stay well hydrated. Tylenol as needed for chills/fever.  If you develop any new or worsening symptoms or do not improve in the next 2 to 3 days, please return.  If your symptoms are severe, please go to the emergency room.  Follow-up with your primary care provider for further evaluation and management of your symptoms as well as ongoing wellness visits.  I hope you feel better!

## 2023-06-16 NOTE — ED Provider Notes (Addendum)
[facility]    CSN: [id number] Arrival date & time: 06/16/23  1606      History   Chief Complaint Chief Complaint  Patient presents with   Urinary Frequency    UTI symptoms - Entered by patient   Abdominal Pain   Back Pain   Chills    HPI Mikayla Sweeney is a 62 y.o. female.   Patient presents to urgent care for evaluation of lower abdominal cramping, body aches, urinary frequency, nausea, chills, and low back discomfort that started 6 days ago on June 11, 2023.  She had a colonoscopy on June 10, 2023 where they removed 2 large polyps. She has been drinking lots of water since colonoscopy to stay well hydrated. Abdominal cramping comes and goes and is currently 8/10. Nothing makes pain better. She has had some diarrhea since colonoscopy but bowel movements have been mostly normal in consistency and color and there has not been any blood/mucous to the stools. Reports sensation of incomplete bladder emptying and significant urinary frequency. No dysuria or hesitancy. She does not take an SGLT-2 inhibitor and is not a diabetic. No dizziness, vomiting, vision changes, or syncope. No recent antibiotic/steroid use.  She is currently nauseous and with slight headache but states this comes and goes. She has not taken any medication for symptoms/headache. States symptoms feel very similar to when she has recurrent BV infection and UTIs.  No vaginal itching, odor, or discharge.    Urinary Frequency Associated symptoms include abdominal pain.  Abdominal Pain Back Pain Associated symptoms: abdominal pain     Past Medical History:  Diagnosis Date   Anxiety    Asthma    B12 deficiency    Bipolar 1 disorder (HCC)    CHF (congestive heart failure) (HCC)    Epilepsy (HCC)    Hypertension    Sarcoidosis     Patient Active Problem List   Diagnosis Date Noted   Acute diastolic heart failure (HCC) 07/27/2014   Pulmonary edema 07/26/2014   Essential hypertension 07/26/2014    Sarcoidosis 07/26/2014   Bipolar 1 disorder (HCC) 07/26/2014   Headache 07/26/2014    Past Surgical History:  Procedure Laterality Date   COLONOSCOPY  2001   [facility]   COLONOSCOPY WITH PROPOFOL  06/10/2023   [facility] at [facility]   TONSILLECTOMY     TUBAL LIGATION      OB History   No obstetric history on file.      Home Medications    Prior to Admission medications   Medication Sig Start Date End Date Taking? Authorizing Provider  amLODipine (NORVASC) 10 MG tablet Take 1 tablet (10 mg total) by mouth daily. 09/04/22  Yes [name], [name], NP  carvedilol (COREG) 25 MG tablet TAKE 1 TABLET BY MOUTH TWICE A DAY WITH FOOD 02/26/23  Yes [name], [name], NP  hydrochlorothiazide (HYDRODIURIL) 25 MG tablet Take 1 tablet (25 mg total) by mouth daily. 09/04/22  Yes [name], [name], NP  losartan (COZAAR) 100 MG tablet Take 1 tablet (100 mg total) by mouth daily. 09/04/22 06/16/23 Yes [name], [name], NP  metroNIDAZOLE (FLAGYL) 500 MG tablet Take 1 tablet (500 mg total) by mouth 2 (two) times daily. 06/16/23  Yes Carlisle Beers, FNP  ondansetron (ZOFRAN-ODT) 4 MG disintegrating tablet Take 1 tablet (4 mg total) by mouth every 8 (eight) hours as needed for nausea or vomiting. 06/16/23  Yes Carlisle Beers, FNP  rosuvastatin (CRESTOR) 40 MG tablet Take 1 tablet (40 mg total) by mouth  daily. 09/04/22  Yes [name], [name], NP  SEMAGLUTIDE-WEIGHT MANAGEMENT [place] Inject into the skin once a week.   Yes [provider]  albuterol (PROVENTIL HFA;VENTOLIN HFA) 108 (90 Base) MCG/ACT inhaler Inhale 1-2 puffs into the lungs every 6 (six) hours as needed. For wheeze or shortness of breath Patient not taking: Reported on 05/21/2023 12/14/18   Belinda Fisher, PA-C  fluticasone [facility]) 50 MCG/ACT nasal spray Place 2 sprays into both nostrils daily. Patient not taking: Reported on 05/21/2023 12/14/18   Belinda Fisher, PA-C  furosemide (LASIX) 20 MG tablet Take 1 tablet (20 mg total) by mouth daily. Patient not  taking: Reported on 06/10/2023 09/04/22   Shirline Frees, NP  ibuprofen (ADVIL) 800 MG tablet Take 800 mg by mouth every 6 (six) hours as needed. Patient not taking: Reported on 05/21/2023 08/09/21   [provider]  ibuprofen (ADVIL,MOTRIN) 600 MG tablet Take 1 tablet (600 mg total) by mouth every 8 (eight) hours as needed. Patient not taking: Reported on 05/21/2023 09/02/18   Shirline Frees, NP  polyethylene glycol (MIRALAX) 17 g packet Take 17 g by mouth daily as needed. Patient not taking: Reported on 06/10/2023 03/25/23   Raspet, Noberto Retort, PA-C  promethazine-dextromethorphan (PROMETHAZINE-DM) 6.25-15 MG/5ML syrup Take 5 mLs by mouth at bedtime as needed for cough. Patient not taking: Reported on 05/21/2023 12/02/22   Carlisle Beers, FNP  tirzepatide [facility]) 10 MG/0.5ML Pen Inject 10 mg into the skin once a week. Patient not taking: Reported on 05/21/2023    [provider]  traZODone (DESYREL) 50 MG tablet TAKE 1 TABLET BY MOUTH AT BEDTIME AS NEEDED FOR SLEEP Patient not taking: Reported on 05/21/2023 05/22/22   Shirline Frees, NP    Family History Family History  Problem Relation Age of Onset   Pancreatic cancer Mother    Hypertension Mother    Diabetes Mother    Heart disease Father    Gout Father    Cirrhosis Father    Heart disease Sister        CHF   Colon cancer Neg Hx    Esophageal cancer Neg Hx    Rectal cancer Neg Hx    Stomach cancer Neg Hx     Social History Social History   Tobacco Use   Smoking status: Former    Types: Cigarettes   Smokeless tobacco: Never  Vaping Use   Vaping status: Never Used  Substance Use Topics   Alcohol use: No   Drug use: No     Allergies   Patient has no known allergies.   Review of Systems Review of Systems  Gastrointestinal:  Positive for abdominal pain.  Genitourinary:  Positive for frequency.  Musculoskeletal:  Positive for back pain.  Per HPI   Physical Exam Triage Vital Signs ED Triage Vitals   Enc Vitals Group     BP 06/16/23 1622 (!) 148/96     Pulse Rate 06/16/23 1622 72     Resp 06/16/23 1622 18     Temp 06/16/23 1622 98.1 F (36.7 C)     Temp src --      SpO2 06/16/23 1622 92 %     Weight --      Height --      Head Circumference --      Peak Flow --      Pain Score 06/16/23 1618 8     Pain Loc --      Pain Edu? --  Excl. in GC? --    No data found.  Updated Vital Signs BP (!) 148/96   Pulse 72   Temp 98.1 F (36.7 C)   Resp 18   LMP 12/10/2014   SpO2 92%   Visual Acuity Right Eye Distance:   Left Eye Distance:   Bilateral Distance:    Right Eye Near:   Left Eye Near:    Bilateral Near:     Physical Exam Vitals and nursing note reviewed.  Constitutional:      Appearance: She is not ill-appearing or toxic-appearing.  HENT:     Head: Normocephalic and atraumatic.     Right Ear: Hearing and external ear normal.     Left Ear: Hearing and external ear normal.     Nose: Nose normal.     Mouth/Throat:     Lips: Pink.     Mouth: Mucous membranes are moist. No injury.     Tongue: No lesions. Tongue does not deviate from midline.     Palate: No mass and lesions.     Pharynx: Oropharynx is clear. Uvula midline. No pharyngeal swelling, oropharyngeal exudate, posterior oropharyngeal erythema or uvula swelling.     Tonsils: No tonsillar exudate or tonsillar abscesses.  Eyes:     General: Lids are normal. Vision grossly intact. Gaze aligned appropriately.     Extraocular Movements: Extraocular movements intact.     Conjunctiva/sclera: Conjunctivae normal.  Cardiovascular:     Rate and Rhythm: Normal rate and regular rhythm.     Heart sounds: Normal heart sounds, S1 normal and S2 normal.  Pulmonary:     Effort: Pulmonary effort is normal. No respiratory distress.     Breath sounds: Normal breath sounds and air entry. No wheezing, rhonchi or rales.  Chest:     Chest wall: No tenderness.  Abdominal:     General: Bowel sounds are normal.      Palpations: Abdomen is soft.     Tenderness: There is no abdominal tenderness. There is no right CVA tenderness, left CVA tenderness or guarding.  Musculoskeletal:     Cervical back: Neck supple.  Skin:    General: Skin is warm and dry.     Capillary Refill: Capillary refill takes less than 2 seconds.     Findings: No rash.  Neurological:     General: No focal deficit present.     Mental Status: She is alert and oriented to person, place, and time. Mental status is at baseline.     Cranial Nerves: No dysarthria or facial asymmetry.  Psychiatric:        Mood and Affect: Mood normal.        Speech: Speech normal.        Behavior: Behavior normal.        Thought Content: Thought content normal.        Judgment: Judgment normal.      UC Treatments / Results  Labs (all labs ordered are listed, but only abnormal results are displayed) Labs Reviewed  POCT URINALYSIS DIP (MANUAL ENTRY) - Abnormal; Notable for the following components:      Result Value   Ketones, POC UA trace (5) (*)    Spec Grav, UA >=1.030 (*)    All other components within normal limits  CERVICOVAGINAL ANCILLARY ONLY - Abnormal; Notable for the following components:   Bacterial Vaginitis (gardnerella) Positive (*)    All other components within normal limits  URINE CULTURE    EKG   Radiology  No results found.  Procedures Procedures (including critical care time)  Medications Ordered in UC Medications  ondansetron (ZOFRAN-ODT) disintegrating tablet 4 mg (4 mg Oral Given 06/16/23 1710)    Initial Impression / Assessment and Plan / UC Course  I have reviewed the triage vital signs and the nursing notes.  Pertinent labs & imaging results that were available during my care of the patient were reviewed by me and considered in my medical decision making (see chart for details).   1. Abdominal cramping, nausea without vomiting, chills, urinary frequency Unclear etiology, however patient suspects this is BV  as this is similar to her previous BV presentation. Although atypical presentation for BV, will treat empirically with flagyl BID for 7 days for BV. Urinalysis unremarkable for UTI. Will culture urine and treat for UTI based on urine culture results. Vaginal swab pending.  Abdominal exam stable and without peritoneal signs.  Zofran as needed for nausea/vomiting. Tylenol as needed for abdominal pain. OB/GYN follow-up encouraged. She has an OB/GYN and plans on discussing management of recurrent BV at next appointment.   Counseled patient on potential for adverse effects with medications prescribed/recommended today, strict ER and return-to-clinic precautions discussed, patient verbalized understanding.    Final Clinical Impressions(s) / UC Diagnoses   Final diagnoses:  Abdominal cramping  Nausea without vomiting  Chills  Urinary frequency     Discharge Instructions      Take flagyl twice a day for 7 days for suspected BV infection. I have sent your urine for culture and will call you if you have a UTI.  The vaginal swab is pending and staff will call you if we need to switch the treatment plan based on the results.  Zofran as needed every 8 hours for nausea.  Continue drinking plenty of fluids to stay well hydrated. Tylenol as needed for chills/fever.  If you develop any new or worsening symptoms or do not improve in the next 2 to 3 days, please return.  If your symptoms are severe, please go to the emergency room.  Follow-up with your primary care provider for further evaluation and management of your symptoms as well as ongoing wellness visits.  I hope you feel better!     ED Prescriptions     Medication Sig Dispense Auth. Provider   ondansetron (ZOFRAN-ODT) 4 MG disintegrating tablet Take 1 tablet (4 mg total) by mouth every 8 (eight) hours as needed for nausea or vomiting. 20 tablet Reita May M, FNP   metroNIDAZOLE (FLAGYL) 500 MG tablet Take 1 tablet (500 mg  total) by mouth 2 (two) times daily. 14 tablet Carlisle Beers, FNP      PDMP not reviewed this encounter.   Carlisle Beers, FNP 06/22/23 2127    Carlisle Beers, FNP 06/22/23 2128

## 2023-06-17 LAB — CERVICOVAGINAL ANCILLARY ONLY
Bacterial Vaginitis (gardnerella): POSITIVE — AB
Candida Glabrata: NEGATIVE
Candida Vaginitis: NEGATIVE
Chlamydia: NEGATIVE
Comment: NEGATIVE
Comment: NEGATIVE
Comment: NEGATIVE
Comment: NEGATIVE
Comment: NEGATIVE
Comment: NORMAL
Neisseria Gonorrhea: NEGATIVE
Trichomonas: NEGATIVE

## 2023-06-17 LAB — URINE CULTURE: Culture: NO GROWTH

## 2023-07-01 ENCOUNTER — Encounter: Payer: 59 | Admitting: Obstetrics and Gynecology

## 2023-07-10 ENCOUNTER — Encounter (INDEPENDENT_AMBULATORY_CARE_PROVIDER_SITE_OTHER): Payer: Self-pay

## 2023-07-31 ENCOUNTER — Ambulatory Visit (INDEPENDENT_AMBULATORY_CARE_PROVIDER_SITE_OTHER): Payer: 59

## 2023-07-31 VITALS — Ht 63.0 in | Wt 201.0 lb

## 2023-07-31 DIAGNOSIS — Z0000 Encounter for general adult medical examination without abnormal findings: Secondary | ICD-10-CM | POA: Diagnosis not present

## 2023-07-31 NOTE — Progress Notes (Signed)
Subjective:   Mikayla Sweeney is a 62 y.o. female who presents for Medicare Annual (Subsequent) preventive examination.  Visit Complete: Virtual  I connected with  [name] on 07/31/23 by a audio enabled telemedicine application and verified that I am speaking with the correct person using two identifiers.  Patient Location: Home  Provider Location: Home Office  I discussed the limitations of evaluation and management by telemedicine. The patient expressed understanding and agreed to proceed.     Review of Systems    Vital Signs: Unable to obtain new vitals due to this being a telehealth visit.  Cardiac Risk Factors include: advanced age (>60men, >72 women);hypertension     Objective:    Today's Vitals   07/31/23 0921  Weight: 201 lb (91.2 kg)  Height: 5\' 3"  (1.6 m)   Body mass index is 35.61 kg/m.     07/31/2023    9:28 AM 11/06/2022   11:43 AM 07/26/2022    3:05 PM 07/04/2021   11:19 AM 04/02/2017   12:10 AM 12/24/2014    9:02 PM 10/10/2014   12:31 PM  Advanced Directives  Does Patient Have a Medical Advance Directive? No No No No No No No  Would patient like information on creating a medical advance directive? No - Patient declined No - Patient declined No - Patient declined    No - patient declined information    Current Medications (verified) Outpatient Encounter Medications as of 07/31/2023  Medication Sig   albuterol (PROVENTIL HFA;VENTOLIN HFA) 108 (90 Base) MCG/ACT inhaler Inhale 1-2 puffs into the lungs every 6 (six) hours as needed. For wheeze or shortness of breath (Patient not taking: Reported on 05/21/2023)   amLODipine (NORVASC) 10 MG tablet Take 1 tablet (10 mg total) by mouth daily.   carvedilol (COREG) 25 MG tablet TAKE 1 TABLET BY MOUTH TWICE A DAY WITH FOOD   fluticasone (FLONASE) 50 MCG/ACT nasal spray Place 2 sprays into both nostrils daily. (Patient not taking: Reported on 05/21/2023)   furosemide (LASIX) 20 MG tablet Take 1 tablet (20 mg total)  by mouth daily. (Patient not taking: Reported on 06/10/2023)   hydrochlorothiazide (HYDRODIURIL) 25 MG tablet Take 1 tablet (25 mg total) by mouth daily.   ibuprofen (ADVIL) 800 MG tablet Take 800 mg by mouth every 6 (six) hours as needed. (Patient not taking: Reported on 05/21/2023)   ibuprofen (ADVIL,MOTRIN) 600 MG tablet Take 1 tablet (600 mg total) by mouth every 8 (eight) hours as needed. (Patient not taking: Reported on 05/21/2023)   losartan (COZAAR) 100 MG tablet Take 1 tablet (100 mg total) by mouth daily.   metroNIDAZOLE (FLAGYL) 500 MG tablet Take 1 tablet (500 mg total) by mouth 2 (two) times daily.   ondansetron (ZOFRAN-ODT) 4 MG disintegrating tablet Take 1 tablet (4 mg total) by mouth every 8 (eight) hours as needed for nausea or vomiting.   polyethylene glycol (MIRALAX) 17 g packet Take 17 g by mouth daily as needed. (Patient not taking: Reported on 06/10/2023)   promethazine-dextromethorphan (PROMETHAZINE-DM) 6.25-15 MG/5ML syrup Take 5 mLs by mouth at bedtime as needed for cough. (Patient not taking: Reported on 05/21/2023)   rosuvastatin (CRESTOR) 40 MG tablet Take 1 tablet (40 mg total) by mouth daily.   SEMAGLUTIDE-WEIGHT MANAGEMENT [place] Inject into the skin once a week.   tirzepatide (MOUNJARO) 10 MG/0.5ML Pen Inject 10 mg into the skin once a week. (Patient not taking: Reported on 05/21/2023)   traZODone (DESYREL) 50 MG tablet TAKE 1  TABLET BY MOUTH AT BEDTIME AS NEEDED FOR SLEEP (Patient not taking: Reported on 05/21/2023)   No facility-administered encounter medications on file as of 07/31/2023.    Allergies (verified) Patient has no known allergies.   History: Past Medical History:  Diagnosis Date   Anxiety    Asthma    B12 deficiency    Bipolar 1 disorder (HCC)    CHF (congestive heart failure) (HCC)    Epilepsy (HCC)    Hypertension    Sarcoidosis    Past Surgical History:  Procedure Laterality Date   COLONOSCOPY  2001   [facility]   COLONOSCOPY WITH PROPOFOL   06/10/2023   [facility]   TONSILLECTOMY     TUBAL LIGATION     Family History  Problem Relation Age of Onset   Pancreatic cancer Mother    Hypertension Mother    Diabetes Mother    Heart disease Father    Gout Father    Cirrhosis Father    Heart disease Sister        CHF   Colon cancer Neg Hx    Esophageal cancer Neg Hx    Rectal cancer Neg Hx    Stomach cancer Neg Hx    Social History   Socioeconomic History   Marital status: Married    Spouse name: Not on file   Number of children: Not on file   Years of education: Not on file   Highest education level: Not on file  Occupational History   Not on file  Tobacco Use   Smoking status: Former    Types: Cigarettes   Smokeless tobacco: Never  Vaping Use   Vaping status: Never Used  Substance and Sexual Activity   Alcohol use: No   Drug use: No   Sexual activity: Not Currently    Birth control/protection: None  Other Topics Concern   Not on file  Social History Narrative   She has her own business    Married    Three children    5 grandchildren       She likes to spend time with her grandchildren    Social Determinants of Health   Financial Resource Strain: Low Risk  (07/31/2023)   Overall Financial Resource Strain (CARDIA)    Difficulty of Paying Living Expenses: Not hard at all  Food Insecurity: No Food Insecurity (07/31/2023)   Hunger Vital Sign    Worried About Running Out of Food in the Last Year: Never true    Ran Out of Food in the Last Year: Never true  Transportation Needs: No Transportation Needs (07/31/2023)   PRAPARE - Administrator, Civil Service (Medical): No    Lack of Transportation (Non-Medical): No  Physical Activity: Inactive (07/31/2023)   Exercise Vital Sign    Days of Exercise per Week: 0 days    Minutes of Exercise per Session: 0 min  Stress: No Stress Concern Present (07/31/2023)   [employer] of Occupational Health - Occupational Stress Questionnaire     Feeling of Stress : Not at all  Social Connections: Moderately Isolated (07/31/2023)   Social Connection and Isolation Panel [NHANES]    Frequency of Communication with Friends and Family: More than three times a week    Frequency of Social Gatherings with Friends and Family: More than three times a week    Attends Religious Services: Never    Database administrator or Organizations: Yes    Attends Banker Meetings:  More than 4 times per year    Marital Status: Divorced    Tobacco Counseling Counseling given: Not Answered   Clinical Intake:  Pre-visit preparation completed: Yes  Pain : No/denies pain     BMI - recorded: 35.61 Nutritional Status: BMI > 30  Obese Nutritional Risks: None Diabetes: No  How often do you need to have someone help you when you read instructions, pamphlets, or other written materials from your doctor or pharmacy?: 1 - Never  Interpreter Needed?: No  Information entered by :: Theresa Mulligan LPN   Activities of Daily Living    07/31/2023    9:27 AM  In your present state of health, do you have any difficulty performing the following activities:  Hearing? 0  Vision? 0  Difficulty concentrating or making decisions? 0  Walking or climbing stairs? 0  Dressing or bathing? 0  Doing errands, shopping? 0  Preparing Food and eating ? N  Using the Toilet? N  In the past six months, have you accidently leaked urine? N  Do you have problems with loss of bowel control? N  Managing your Medications? N  Managing your Finances? N  Housekeeping or managing your Housekeeping? N    Patient Care Team: Shirline Frees, NP as PCP - General (Family Medicine)  Indicate any recent Medical Services you may have received from other than [facility] providers in the past year (date may be approximate).     Assessment:   This is a routine wellness examination for Mikayla Sweeney.  Hearing/Vision screen Hearing Screening - Comments:: Denies hearing difficulties    Vision Screening - Comments:: Wears rx glasses - up to date with routine eye exams with Dr Dione Booze   Dietary issues and exercise activities discussed:     Goals Addressed               This Visit's Progress     Lose weight (pt-stated)        I want to continue weight loss and get off B/P med.       Depression Screen    07/31/2023    9:26 AM 05/09/2023    1:16 PM 11/06/2022   11:40 AM 09/04/2022    9:34 AM 07/26/2022    3:01 PM 07/04/2021   11:20 AM 07/04/2021   11:18 AM  PHQ 2/9 Scores  PHQ - 2 Score 0 0 0 0 0 0 0  PHQ- 9 Score  3         Fall Risk    07/31/2023    9:27 AM 05/09/2023    1:16 PM 07/26/2022    3:04 PM 07/04/2021   11:20 AM 06/17/2020    9:40 AM  Fall Risk   Falls in the past year? 0  0 0 0  Number falls in past yr: 0 0 0 0   Injury with Fall? 0 0 0 0   Risk for fall due to : No Fall Risks  No Fall Risks    Follow up Falls prevention discussed Falls evaluation completed  Falls evaluation completed     MEDICARE RISK AT HOME: Medicare Risk at Home Any stairs in or around the home?: No If so, are there any without handrails?: No Home free of loose throw rugs in walkways, pet beds, electrical cords, etc?: Yes Adequate lighting in your home to reduce risk of falls?: Yes Life alert?: No Use of a cane, walker or w/c?: No Grab bars in the bathroom?: No Shower chair or bench in  shower?: No Elevated toilet seat or a handicapped toilet?: No  TIMED UP AND GO:  Was the test performed?  No    Cognitive Function:        07/31/2023    9:28 AM 07/26/2022    3:05 PM  6CIT Screen  What Year? 0 points 0 points  What month? 0 points 0 points  What time? 0 points 0 points  Count back from 20 0 points 0 points  Months in reverse 0 points 0 points  Repeat phrase 0 points 0 points  Total Score 0 points 0 points    Immunizations Immunization History  Administered Date(s) Administered   Influenza,inj,Quad PF,6+ Mos 10/16/2017, 12/24/2018, 08/31/2021,  09/04/2022   PFIZER(Purple Top)SARS-COV-2 Vaccination 03/24/2020, 04/18/2020   Pfizer Covid-19 Vaccine Bivalent Booster 54yrs & up 12/18/2021   Tdap 02/05/2013    TDAP status: Due, Education has been provided regarding the importance of this vaccine. Advised may receive this vaccine at local pharmacy or Health Dept. Aware to provide a copy of the vaccination record if obtained from local pharmacy or Health Dept. Verbalized acceptance and understanding.  Flu Vaccine status: Due, Education has been provided regarding the importance of this vaccine. Advised may receive this vaccine at local pharmacy or Health Dept. Aware to provide a copy of the vaccination record if obtained from local pharmacy or Health Dept. Verbalized acceptance and understanding.    Covid-19 vaccine status: Declined, Education has been provided regarding the importance of this vaccine but patient still declined. Advised may receive this vaccine at local pharmacy or Health Dept.or vaccine clinic. Aware to provide a copy of the vaccination record if obtained from local pharmacy or Health Dept. Verbalized acceptance and understanding.  Qualifies for Shingles Vaccine? Yes   Zostavax completed No   Shingrix Completed?: No.    Education has been provided regarding the importance of this vaccine. Patient has been advised to call insurance company to determine out of pocket expense if they have not yet received this vaccine. Advised may also receive vaccine at local pharmacy or Health Dept. Verbalized acceptance and understanding.  Screening Tests Health Maintenance  Topic Date Due   Zoster Vaccines- Shingrix (1 of 2) Never done   PAP SMEAR-Modifier  12/19/2014   COVID-19 Vaccine (4 - 2023-24 season) 08/10/2022   DTaP/Tdap/Td (2 - Td or Tdap) 02/05/2023   INFLUENZA VACCINE  07/11/2023   MAMMOGRAM  01/17/2024   Medicare Annual Wellness (AWV)  07/30/2024   Colonoscopy  06/09/2033   Hepatitis C Screening  Completed   HIV  Screening  Completed   HPV VACCINES  Aged Out    Health Maintenance  Health Maintenance Due  Topic Date Due   Zoster Vaccines- Shingrix (1 of 2) Never done   PAP SMEAR-Modifier  12/19/2014   COVID-19 Vaccine (4 - 2023-24 season) 08/10/2022   DTaP/Tdap/Td (2 - Td or Tdap) 02/05/2023   INFLUENZA VACCINE  07/11/2023    Colorectal cancer screening: Type of screening: Colonoscopy. Completed 06/10/23. Repeat every 10 years  Mammogram status: Completed 01/16/23. Repeat every year    Lung Cancer Screening: (Low Dose CT Chest recommended if Age 20-80 years, 20 pack-year currently smoking OR have quit w/in 15years.) does not qualify.     Additional Screening:  Hepatitis C Screening: does qualify; Completed 12/24/18  Vision Screening: Recommended annual ophthalmology exams for early detection of glaucoma and other disorders of the eye. Is the patient up to date with their annual eye exam?  Yes  Who is the  provider or what is the name of the office in which the patient attends annual eye exams? Dr Dione Booze If pt is not established with a provider, would they like to be referred to a provider to establish care? No .   Dental Screening: Recommended annual dental exams for proper oral hygiene    Community Resource Referral / Chronic Care Management:  CRR required this visit?  No   CCM required this visit?  No     Plan:     I have personally reviewed and noted the following in the patient's chart:   Medical and social history Use of alcohol, tobacco or illicit drugs  Current medications and supplements including opioid prescriptions. Patient is not currently taking opioid prescriptions. Functional ability and status Nutritional status Physical activity Advanced directives List of other physicians Hospitalizations, surgeries, and ER visits in previous 12 months Vitals Screenings to include cognitive, depression, and falls Referrals and appointments  In addition, I have reviewed  and discussed with patient certain preventive protocols, quality metrics, and best practice recommendations. A written personalized care plan for preventive services as well as general preventive health recommendations were provided to patient.     [name], LPN   [id number]/61/0960   After Visit Summary: (MyChart) Due to this being a telephonic visit, the after visit summary with patients personalized plan was offered to patient via MyChart   Nurse Notes: None

## 2023-07-31 NOTE — Patient Instructions (Addendum)
Mikayla Sweeney , Thank you for taking time to come for your Medicare Wellness Visit. I appreciate your ongoing commitment to your health goals. Please review the following plan we discussed and let me know if I can assist you in the future.   Referrals/Orders/Follow-Ups/Clinician Recommendations:   This is a list of the screening recommended for you and due dates:  Health Maintenance  Topic Date Due   Zoster (Shingles) Vaccine (1 of 2) Never done   Pap Smear  12/19/2014   COVID-19 Vaccine (4 - 2023-24 season) 08/10/2022   DTaP/Tdap/Td vaccine (2 - Td or Tdap) 02/05/2023   Flu Shot  07/11/2023   Mammogram  01/17/2024   Medicare Annual Wellness Visit  07/30/2024   Colon Cancer Screening  06/09/2033   Hepatitis C Screening  Completed   HIV Screening  Completed   HPV Vaccine  Aged Out    Advanced directives: (Declined) Advance directive discussed with you today. Even though you declined this today, please call our office should you change your mind, and we can give you the proper paperwork for you to fill out.  Next Medicare Annual Wellness Visit scheduled for next year: Yes

## 2023-09-03 NOTE — Telephone Encounter (Signed)
na

## 2023-09-06 ENCOUNTER — Encounter: Payer: 59 | Admitting: Adult Health

## 2023-09-06 NOTE — Progress Notes (Deleted)
Subjective:    Patient ID: Mikayla Sweeney, female    DOB: 11-28-1961, 62 y.o.   MRN: [id number]  HPI Patient presents for yearly preventative medicine examination. She is a pleasant 62 year old female who  has a past medical history of Anxiety, Asthma, B12 deficiency, Bipolar 1 disorder (HCC), CHF (congestive heart failure) (HCC), Epilepsy (HCC), Hypertension, and Sarcoidosis.  Essential hypertension-managed with Norvasc 10 mg daily, Coreg 25 mg twice daily, HCTZ 25 mg daily, and Cozaar 100 mg daily.  She denies dizziness, lightheadedness, blurred vision, or headaches BP Readings from Last 3 Encounters:  06/16/23 (!) 148/96  06/10/23 (!) 147/77  05/09/23 130/84    CHF-managed with Lasix 20 mg daily.  She denies chest pain, shortness of breath, or lower extremity edema  GERD-takes Prilosec 20 mg daily  OAB-symptoms are controlled with Ditropan 5 mg at bedtime  Insomnia-longstanding issue.  Currently prescribed trazodone 50 mg/ She takes this infrequently.   Hyperlipidemia-managed with Crestor 40 mg daily.  She denies myalgia or fatigue Lab Results  Component Value Date   CHOL 168 09/04/2022   HDL 67.70 09/04/2022   LDLCALC 86 09/04/2022   TRIG 73.0 09/04/2022   CHOLHDL 2 09/04/2022    Pre diabetes -  Is now on Mounjaro 10 mg.  Lab Results  Component Value Date   HGBA1C 5.7 09/04/2022    Weight loss management - is doing an on line weight loss program through [facility]. She is currently on Mounjaro 10 mg weekly.    All immunizations and health maintenance protocols were reviewed with the patient and needed orders were placed.  Appropriate screening laboratory values were ordered for the patient including screening of hyperlipidemia, renal function and hepatic function. Medication reconciliation,  past medical history, social history, problem list and allergies were reviewed in detail with the patient  Goals were established with regard to weight loss, exercise, and  diet  in compliance with medications Wt Readings from Last 10 Encounters:  07/31/23 201 lb (91.2 kg)  06/10/23 200 lb 6.4 oz (90.9 kg)  05/21/23 199 lb (90.3 kg)  05/09/23 201 lb 12.8 oz (91.5 kg)  03/25/23 170 lb (77.1 kg)  01/08/23 190 lb (86.2 kg)  12/30/22 185 lb (83.9 kg)  12/20/22 187 lb 6.4 oz (85 kg)  10/17/22 175 lb (79.4 kg)  09/04/22 175 lb 9.6 oz (79.7 kg)   She is up to date on routine colon cancer screening   Review of Systems  Constitutional: Negative.   HENT: Negative.    Eyes: Negative.   Respiratory: Negative.    Cardiovascular: Negative.   Gastrointestinal: Negative.   Endocrine: Negative.   Genitourinary: Negative.   Musculoskeletal: Negative.   Skin: Negative.   Allergic/Immunologic: Negative.   Neurological: Negative.   Hematological: Negative.   Psychiatric/Behavioral: Negative.     Past Medical History:  Diagnosis Date   Anxiety    Asthma    B12 deficiency    Bipolar 1 disorder (HCC)    CHF (congestive heart failure) (HCC)    Epilepsy (HCC)    Hypertension    Sarcoidosis     Social History   Socioeconomic History   Marital status: Married    Spouse name: Not on file   Number of children: Not on file   Years of education: Not on file   Highest education level: Not on file  Occupational History   Not on file  Tobacco Use   Smoking status: Former    Types: Cigarettes  Smokeless tobacco: Never  Vaping Use   Vaping status: Never Used  Substance and Sexual Activity   Alcohol use: No   Drug use: No   Sexual activity: Not Currently    Birth control/protection: None  Other Topics Concern   Not on file  Social History Narrative   She has her own business    Married    Three children    5 grandchildren       She likes to spend time with her grandchildren    Social Determinants of Health   Financial Resource Strain: Low Risk  (07/31/2023)   Overall Financial Resource Strain (CARDIA)    Difficulty of Paying Living Expenses: Not hard  at all  Food Insecurity: No Food Insecurity (07/31/2023)   Hunger Vital Sign    Worried About Running Out of Food in the Last Year: Never true    Ran Out of Food in the Last Year: Never true  Transportation Needs: No Transportation Needs (07/31/2023)   PRAPARE - Administrator, Civil Service (Medical): No    Lack of Transportation (Non-Medical): No  Physical Activity: Inactive (07/31/2023)   Exercise Vital Sign    Days of Exercise per Week: 0 days    Minutes of Exercise per Session: 0 min  Stress: No Stress Concern Present (07/31/2023)   [employer] of Occupational Health - Occupational Stress Questionnaire    Feeling of Stress : Not at all  Social Connections: Moderately Isolated (07/31/2023)   Social Connection and Isolation Panel [NHANES]    Frequency of Communication with Friends and Family: More than three times a week    Frequency of Social Gatherings with Friends and Family: More than three times a week    Attends Religious Services: Never    Database administrator or Organizations: Yes    Attends Engineer, structural: More than 4 times per year    Marital Status: Divorced  Intimate Partner Violence: Not At Risk (07/31/2023)   Humiliation, Afraid, Rape, and Kick questionnaire    Fear of Current or Ex-Partner: No    Emotionally Abused: No    Physically Abused: No    Sexually Abused: No    Past Surgical History:  Procedure Laterality Date   COLONOSCOPY  2001   [facility]   COLONOSCOPY WITH PROPOFOL  06/10/2023   [facility] at [facility]   TONSILLECTOMY     TUBAL LIGATION      Family History  Problem Relation Age of Onset   Pancreatic cancer Mother    Hypertension Mother    Diabetes Mother    Heart disease Father    Gout Father    Cirrhosis Father    Heart disease Sister        CHF   Colon cancer Neg Hx    Esophageal cancer Neg Hx    Rectal cancer Neg Hx    Stomach cancer Neg Hx     No Known Allergies  Current Outpatient Medications on  File Prior to Visit  Medication Sig Dispense Refill   albuterol (PROVENTIL HFA;VENTOLIN HFA) 108 (90 Base) MCG/ACT inhaler Inhale 1-2 puffs into the lungs every 6 (six) hours as needed. For wheeze or shortness of breath (Patient not taking: Reported on 05/21/2023) 1 Inhaler 0   amLODipine (NORVASC) 10 MG tablet Take 1 tablet (10 mg total) by mouth daily. 90 tablet 3   carvedilol (COREG) 25 MG tablet TAKE 1 TABLET BY MOUTH TWICE A DAY WITH FOOD 180 tablet 1  fluticasone (FLONASE) 50 MCG/ACT nasal spray Place 2 sprays into both nostrils daily. (Patient not taking: Reported on 05/21/2023) 1 g 0   furosemide (LASIX) 20 MG tablet Take 1 tablet (20 mg total) by mouth daily. (Patient not taking: Reported on 06/10/2023) 90 tablet 3   hydrochlorothiazide (HYDRODIURIL) 25 MG tablet Take 1 tablet (25 mg total) by mouth daily. 90 tablet 3   ibuprofen (ADVIL) 800 MG tablet Take 800 mg by mouth every 6 (six) hours as needed. (Patient not taking: Reported on 05/21/2023)     ibuprofen (ADVIL,MOTRIN) 600 MG tablet Take 1 tablet (600 mg total) by mouth every 8 (eight) hours as needed. (Patient not taking: Reported on 05/21/2023) 30 tablet 0   losartan (COZAAR) 100 MG tablet Take 1 tablet (100 mg total) by mouth daily. 90 tablet 3   metroNIDAZOLE (FLAGYL) 500 MG tablet Take 1 tablet (500 mg total) by mouth 2 (two) times daily. 14 tablet 0   ondansetron (ZOFRAN-ODT) 4 MG disintegrating tablet Take 1 tablet (4 mg total) by mouth every 8 (eight) hours as needed for nausea or vomiting. 20 tablet 0   polyethylene glycol (MIRALAX) 17 g packet Take 17 g by mouth daily as needed. (Patient not taking: Reported on 06/10/2023) 30 each 0   promethazine-dextromethorphan (PROMETHAZINE-DM) 6.25-15 MG/5ML syrup Take 5 mLs by mouth at bedtime as needed for cough. (Patient not taking: Reported on 05/21/2023) 118 mL 0   rosuvastatin (CRESTOR) 40 MG tablet Take 1 tablet (40 mg total) by mouth daily. 90 tablet 3   SEMAGLUTIDE-WEIGHT MANAGEMENT [place]  Inject into the skin once a week.     tirzepatide (MOUNJARO) 10 MG/0.5ML Pen Inject 10 mg into the skin once a week. (Patient not taking: Reported on 05/21/2023)     traZODone (DESYREL) 50 MG tablet TAKE 1 TABLET BY MOUTH AT BEDTIME AS NEEDED FOR SLEEP (Patient not taking: Reported on 05/21/2023) 90 tablet 0   No current facility-administered medications on file prior to visit.    LMP 12/10/2014       Objective:   Physical Exam Vitals and nursing note reviewed.  Constitutional:      General: She is not in acute distress.    Appearance: Normal appearance. She is not ill-appearing.  HENT:     Head: Normocephalic and atraumatic.     Right Ear: Tympanic membrane, ear canal and external ear normal. There is no impacted cerumen.     Left Ear: Tympanic membrane, ear canal and external ear normal. There is no impacted cerumen.     Nose: Nose normal. No congestion or rhinorrhea.     Mouth/Throat:     Mouth: Mucous membranes are moist.     Pharynx: Oropharynx is clear.  Eyes:     Extraocular Movements: Extraocular movements intact.     Conjunctiva/sclera: Conjunctivae normal.     Pupils: Pupils are equal, round, and reactive to light.  Neck:     Vascular: No carotid bruit.  Cardiovascular:     Rate and Rhythm: Normal rate and regular rhythm.     Pulses: Normal pulses.     Heart sounds: No murmur heard.    No friction rub. No gallop.  Pulmonary:     Effort: Pulmonary effort is normal.     Breath sounds: Normal breath sounds.  Abdominal:     General: Abdomen is flat. Bowel sounds are normal. There is no distension.     Palpations: Abdomen is soft. There is no mass.     Tenderness:  There is no abdominal tenderness. There is no guarding or rebound.     Hernia: No hernia is present.  Musculoskeletal:        General: Normal range of motion.     Cervical back: Normal range of motion and neck supple.  Lymphadenopathy:     Cervical: No cervical adenopathy.  Skin:    General: Skin is warm  and dry.     Capillary Refill: Capillary refill takes less than 2 seconds.  Neurological:     General: No focal deficit present.     Mental Status: She is alert and oriented to person, place, and time.  Psychiatric:        Mood and Affect: Mood normal.        Behavior: Behavior normal.        Thought Content: Thought content normal.        Judgment: Judgment normal.           Assessment & Plan:

## 2023-10-03 ENCOUNTER — Ambulatory Visit: Payer: 59 | Admitting: Adult Health

## 2023-10-03 ENCOUNTER — Encounter: Payer: Self-pay | Admitting: Adult Health

## 2023-10-03 VITALS — BP 136/84 | Temp 98.3°F | Ht 63.0 in | Wt 193.0 lb

## 2023-10-03 DIAGNOSIS — E782 Mixed hyperlipidemia: Secondary | ICD-10-CM | POA: Diagnosis not present

## 2023-10-03 DIAGNOSIS — E66811 Obesity, class 1: Secondary | ICD-10-CM | POA: Diagnosis not present

## 2023-10-03 DIAGNOSIS — Z0000 Encounter for general adult medical examination without abnormal findings: Secondary | ICD-10-CM

## 2023-10-03 DIAGNOSIS — I5032 Chronic diastolic (congestive) heart failure: Secondary | ICD-10-CM | POA: Diagnosis not present

## 2023-10-03 DIAGNOSIS — R7303 Prediabetes: Secondary | ICD-10-CM | POA: Diagnosis not present

## 2023-10-03 DIAGNOSIS — I10 Essential (primary) hypertension: Secondary | ICD-10-CM | POA: Diagnosis not present

## 2023-10-03 DIAGNOSIS — Z23 Encounter for immunization: Secondary | ICD-10-CM | POA: Diagnosis not present

## 2023-10-03 LAB — COMPREHENSIVE METABOLIC PANEL
ALT: 20 U/L (ref 0–35)
AST: 21 U/L (ref 0–37)
Albumin: 3.9 g/dL (ref 3.5–5.2)
Alkaline Phosphatase: 72 U/L (ref 39–117)
BUN: 15 mg/dL (ref 6–23)
CO2: 28 meq/L (ref 19–32)
Calcium: 9.3 mg/dL (ref 8.4–10.5)
Chloride: 106 meq/L (ref 96–112)
Creatinine, Ser: 0.81 mg/dL (ref 0.40–1.20)
GFR: 77.78 mL/min (ref >60.00)
Glucose, Bld: 102 mg/dL — ABNORMAL HIGH (ref 70–99)
Potassium: 3.5 meq/L (ref 3.5–5.1)
Sodium: 143 meq/L (ref 135–145)
Total Bilirubin: 0.4 mg/dL (ref 0.2–1.2)
Total Protein: 7.3 g/dL (ref 6.0–8.3)

## 2023-10-03 LAB — CBC WITH DIFFERENTIAL/PLATELET
Basophils Absolute: 0.1 10*3/uL (ref 0.0–0.1)
Basophils Relative: 1.3 % (ref 0.0–3.0)
Eosinophils Absolute: 0.1 10*3/uL (ref 0.0–0.7)
Eosinophils Relative: 0.7 % (ref 0.0–5.0)
HCT: 42.2 % (ref 36.0–46.0)
Hemoglobin: 13.6 g/dL (ref 12.0–15.0)
Lymphocytes Relative: 20.1 % (ref 12.0–46.0)
Lymphs Abs: 1.9 10*3/uL (ref 0.7–4.0)
MCHC: 32.2 g/dL (ref 30.0–36.0)
MCV: 92.6 fL (ref 78.0–100.0)
Monocytes Absolute: 0.6 10*3/uL (ref 0.1–1.0)
Monocytes Relative: 6.5 % (ref 3.0–12.0)
Neutro Abs: 6.7 10*3/uL (ref 1.4–7.7)
Neutrophils Relative %: 71.4 % (ref 43.0–77.0)
Platelets: 195 10*3/uL (ref 150.0–400.0)
RBC: 4.56 Mil/uL (ref 3.87–5.11)
RDW: 14.1 % (ref 11.5–15.5)
WBC: 9.3 10*3/uL (ref 4.0–10.5)

## 2023-10-03 LAB — TSH: TSH: 1.11 u[IU]/mL (ref 0.35–5.50)

## 2023-10-03 LAB — LIPID PANEL
Cholesterol: 171 mg/dL (ref 0–200)
HDL: 77.6 mg/dL (ref >39.00)
LDL Cholesterol: 78 mg/dL (ref 0–99)
NonHDL: 93.24
Total CHOL/HDL Ratio: 2
Triglycerides: 76 mg/dL (ref 0.0–149.0)
VLDL: 15.2 mg/dL (ref 0.0–40.0)

## 2023-10-03 LAB — HEMOGLOBIN A1C: Hgb A1c MFr Bld: 5.9 % (ref 4.6–6.5)

## 2023-10-03 MED ORDER — LOSARTAN POTASSIUM 100 MG PO TABS
100.0000 mg | ORAL_TABLET | Freq: Every day | ORAL | 3 refills | Status: DC
Start: 2023-10-03 — End: 2024-10-20

## 2023-10-03 MED ORDER — ROSUVASTATIN CALCIUM 40 MG PO TABS
40.0000 mg | ORAL_TABLET | Freq: Every day | ORAL | 3 refills | Status: DC
Start: 2023-10-03 — End: 2024-10-20

## 2023-10-03 MED ORDER — CARVEDILOL 25 MG PO TABS
25.0000 mg | ORAL_TABLET | Freq: Two times a day (BID) | ORAL | 3 refills | Status: DC
Start: 2023-10-03 — End: 2024-04-29

## 2023-10-03 MED ORDER — ONDANSETRON 4 MG PO TBDP: 4.0000 mg | ORAL_TABLET | Freq: Three times a day (TID) | ORAL | 0 refills | Status: AC | PRN

## 2023-10-03 MED ORDER — HYDROCHLOROTHIAZIDE 25 MG PO TABS
25.0000 mg | ORAL_TABLET | Freq: Every day | ORAL | 3 refills | Status: DC
Start: 2023-10-03 — End: 2024-10-20

## 2023-10-03 MED ORDER — AMLODIPINE BESYLATE 10 MG PO TABS: 10.0000 mg | ORAL_TABLET | Freq: Every day | ORAL | 3 refills | Status: DC

## 2023-10-03 MED ORDER — FUROSEMIDE 20 MG PO TABS
20.0000 mg | ORAL_TABLET | Freq: Every day | ORAL | 3 refills | Status: DC
Start: 2023-10-03 — End: 2024-10-20

## 2023-10-03 NOTE — Patient Instructions (Addendum)
It was great seeing you today!  We will follow up with you regarding your lab work   Please let me know if you need anything   Keep working on weight loss through diet and exercise

## 2023-10-03 NOTE — Progress Notes (Signed)
Subjective:    Patient ID: [name], female    DOB: 04/28/1961, 62 y.o.   MRN: [id number]  HPI Patient presents for yearly preventative medicine examination. She is a pleasant 62 year old female who  has a past medical history of Anxiety, Asthma, B12 deficiency, Bipolar 1 disorder (HCC), CHF (congestive heart failure) (HCC), Epilepsy (HCC), Hypertension, and Sarcoidosis.  Essential hypertension-managed with Norvasc 10 mg daily, Coreg 25 mg twice daily, HCTZ 25 mg daily ( has been out of the medication) , and Cozaar 100 mg daily.  She denies dizziness, lightheadedness, blurred vision, or headaches BP Readings from Last 3 Encounters:  10/03/23 136/84  06/16/23 (!) 148/96  06/10/23 (!) 147/77    CHF-managed with Lasix 20 mg daily.  She denies chest pain, shortness of breath, or lower extremity edema  Hyperlipidemia-managed with Crestor 40 mg daily.  She denies myalgia or fatigue Lab Results  Component Value Date   CHOL 168 09/04/2022   HDL 67.70 09/04/2022   LDLCALC 86 09/04/2022   TRIG 73.0 09/04/2022   CHOLHDL 2 09/04/2022   Pre diabetes - Trying to stay active and eat healthy  Lab Results  Component Value Date   HGBA1C 5.7 09/04/2022   HGBA1C 5.9 08/31/2021   HGBA1C 5.7 (H) 06/17/2020   W All immunizations and health maintenance protocols were reviewed with the patient and needed orders were placed.  Appropriate screening laboratory values were ordered for the patient including screening of hyperlipidemia, renal function and hepatic function. Medication reconciliation,  past medical history, social history, problem list and allergies were reviewed in detail with the patient  Goals were established with regard to weight loss, exercise, and  diet in compliance with medications  She is up to date on routine colon cancer screening and mammograms. She is going to schedule her PAP.   She has no acute complaints   Review of Systems  Constitutional: Negative.   HENT:  Negative.    Eyes: Negative.   Respiratory: Negative.    Cardiovascular: Negative.   Gastrointestinal: Negative.   Endocrine: Negative.   Genitourinary: Negative.   Musculoskeletal: Negative.   Skin: Negative.   Allergic/Immunologic: Negative.   Neurological: Negative.   Hematological: Negative.   Psychiatric/Behavioral: Negative.     Past Medical History:  Diagnosis Date   Anxiety    Asthma    B12 deficiency    Bipolar 1 disorder (HCC)    CHF (congestive heart failure) (HCC)    Epilepsy (HCC)    Hypertension    Sarcoidosis     Social History   Socioeconomic History   Marital status: Married    Spouse name: Not on file   Number of children: Not on file   Years of education: Not on file   Highest education level: Not on file  Occupational History   Not on file  Tobacco Use   Smoking status: Former    Types: Cigarettes   Smokeless tobacco: Never  Vaping Use   Vaping status: Never Used  Substance and Sexual Activity   Alcohol use: No   Drug use: No   Sexual activity: Not Currently    Birth control/protection: None  Other Topics Concern   Not on file  Social History Narrative   She has her own business    Married    Three children    5 grandchildren       She likes to spend time with her grandchildren    Social Determinants of Health  Financial Resource Strain: Low Risk  (07/31/2023)   Overall Financial Resource Strain (CARDIA)    Difficulty of Paying Living Expenses: Not hard at all  Food Insecurity: No Food Insecurity (07/31/2023)   Hunger Vital Sign    Worried About Running Out of Food in the Last Year: Never true    Ran Out of Food in the Last Year: Never true  Transportation Needs: No Transportation Needs (07/31/2023)   PRAPARE - Administrator, Civil Service (Medical): No    Lack of Transportation (Non-Medical): No  Physical Activity: Inactive (07/31/2023)   Exercise Vital Sign    Days of Exercise per Week: 0 days    Minutes of  Exercise per Session: 0 min  Stress: No Stress Concern Present (07/31/2023)   [employer] of Occupational Health - Occupational Stress Questionnaire    Feeling of Stress : Not at all  Social Connections: Moderately Isolated (07/31/2023)   Social Connection and Isolation Panel [NHANES]    Frequency of Communication with Friends and Family: More than three times a week    Frequency of Social Gatherings with Friends and Family: More than three times a week    Attends Religious Services: Never    Database administrator or Organizations: Yes    Attends Engineer, structural: More than 4 times per year    Marital Status: Divorced  Intimate Partner Violence: Not At Risk (07/31/2023)   Humiliation, Afraid, Rape, and Kick questionnaire    Fear of Current or Ex-Partner: No    Emotionally Abused: No    Physically Abused: No    Sexually Abused: No    Past Surgical History:  Procedure Laterality Date   COLONOSCOPY  2001   [facility]   COLONOSCOPY WITH PROPOFOL  06/10/2023   [facility] at [facility]   TONSILLECTOMY     TUBAL LIGATION      Family History  Problem Relation Age of Onset   Pancreatic cancer Mother    Hypertension Mother    Diabetes Mother    Heart disease Father    Gout Father    Cirrhosis Father    Heart disease Sister        CHF   Colon cancer Neg Hx    Esophageal cancer Neg Hx    Rectal cancer Neg Hx    Stomach cancer Neg Hx     No Known Allergies  Current Outpatient Medications on File Prior to Visit  Medication Sig Dispense Refill   amLODipine (NORVASC) 10 MG tablet Take 1 tablet (10 mg total) by mouth daily. 90 tablet 3   carvedilol (COREG) 25 MG tablet TAKE 1 TABLET BY MOUTH TWICE A DAY WITH FOOD 180 tablet 1   hydrochlorothiazide (HYDRODIURIL) 25 MG tablet Take 1 tablet (25 mg total) by mouth daily. 90 tablet 3   losartan (COZAAR) 100 MG tablet Take 1 tablet (100 mg total) by mouth daily. 90 tablet 3   rosuvastatin (CRESTOR) 40 MG tablet Take 1 tablet  (40 mg total) by mouth daily. 90 tablet 3   albuterol (PROVENTIL HFA;VENTOLIN HFA) 108 (90 Base) MCG/ACT inhaler Inhale 1-2 puffs into the lungs every 6 (six) hours as needed. For wheeze or shortness of breath (Patient not taking: Reported on 05/21/2023) 1 Inhaler 0   fluticasone (FLONASE) 50 MCG/ACT nasal spray Place 2 sprays into both nostrils daily. (Patient not taking: Reported on 05/21/2023) 1 g 0   furosemide (LASIX) 20 MG tablet Take 1 tablet (20 mg total) by mouth  daily. (Patient not taking: Reported on 06/10/2023) 90 tablet 3   No current facility-administered medications on file prior to visit.    BP 136/84   Temp 98.3 F (36.8 C) (Oral)   Ht 5\' 3"  (1.6 m)   Wt 193 lb (87.5 kg)   LMP 12/10/2014   BMI 34.19 kg/m       Objective:   Physical Exam Vitals and nursing note reviewed.  Constitutional:      General: She is not in acute distress.    Appearance: Normal appearance. She is not ill-appearing.  HENT:     Head: Normocephalic and atraumatic.     Right Ear: Tympanic membrane, ear canal and external ear normal. There is no impacted cerumen.     Left Ear: Tympanic membrane, ear canal and external ear normal. There is no impacted cerumen.     Nose: Nose normal. No congestion or rhinorrhea.     Mouth/Throat:     Mouth: Mucous membranes are moist.     Pharynx: Oropharynx is clear.  Eyes:     Extraocular Movements: Extraocular movements intact.     Conjunctiva/sclera: Conjunctivae normal.     Pupils: Pupils are equal, round, and reactive to light.  Neck:     Vascular: No carotid bruit.  Cardiovascular:     Rate and Rhythm: Normal rate and regular rhythm.     Pulses: Normal pulses.     Heart sounds: No murmur heard.    No friction rub. No gallop.  Pulmonary:     Effort: Pulmonary effort is normal.     Breath sounds: Normal breath sounds.  Abdominal:     General: Abdomen is flat. Bowel sounds are normal. There is no distension.     Palpations: Abdomen is soft. There is  no mass.     Tenderness: There is no abdominal tenderness. There is no guarding or rebound.     Hernia: No hernia is present.  Musculoskeletal:        General: Normal range of motion.     Cervical back: Normal range of motion and neck supple.  Lymphadenopathy:     Cervical: No cervical adenopathy.  Skin:    General: Skin is warm and dry.     Capillary Refill: Capillary refill takes less than 2 seconds.  Neurological:     General: No focal deficit present.     Mental Status: She is alert and oriented to person, place, and time.  Psychiatric:        Mood and Affect: Mood normal.        Behavior: Behavior normal.        Thought Content: Thought content normal.        Judgment: Judgment normal.       Assessment & Plan:  1. Routine general medical examination at a health care facility Today patient counseled on age appropriate routine health concerns for screening and prevention, each reviewed and up to date or declined. Immunizations reviewed and up to date or declined. Labs ordered and reviewed. Risk factors for depression reviewed and negative. Hearing function and visual acuity are intact. ADLs screened and addressed as needed. Functional ability and level of safety reviewed and appropriate. Education, counseling and referrals performed based on assessed risks today. Patient provided with a copy of personalized plan for preventive services. - Continue to eat healthy and exercise  - Follow up in one year or sooner if needed  2. Essential hypertension - Continue with current treatment  - CBC  with Differential/Platelet; Future - Comprehensive metabolic panel; Future - Lipid panel; Future - TSH; Future - amLODipine (NORVASC) 10 MG tablet; Take 1 tablet (10 mg total) by mouth daily.  Dispense: 90 tablet; Refill: 3 - carvedilol (COREG) 25 MG tablet; Take 1 tablet (25 mg total) by mouth 2 (two) times daily with a meal.  Dispense: 180 tablet; Refill: 3 - furosemide (LASIX) 20 MG tablet;  Take 1 tablet (20 mg total) by mouth daily.  Dispense: 90 tablet; Refill: 3 - hydrochlorothiazide (HYDRODIURIL) 25 MG tablet; Take 1 tablet (25 mg total) by mouth daily.  Dispense: 90 tablet; Refill: 3 - losartan (COZAAR) 100 MG tablet; Take 1 tablet (100 mg total) by mouth daily.  Dispense: 90 tablet; Refill: 3  3. Chronic diastolic CHF (congestive heart failure) (HCC) - euvolemic today  - CBC with Differential/Platelet; Future - Comprehensive metabolic panel; Future - Lipid panel; Future - TSH; Future - carvedilol (COREG) 25 MG tablet; Take 1 tablet (25 mg total) by mouth 2 (two) times daily with a meal.  Dispense: 180 tablet; Refill: 3 - furosemide (LASIX) 20 MG tablet; Take 1 tablet (20 mg total) by mouth daily.  Dispense: 90 tablet; Refill: 3   4. Mixed hyperlipidemia - Continue with Crestor 40 mg daily  - CBC with Differential/Platelet; Future - Comprehensive metabolic panel; Future - Lipid panel; Future - TSH; Future - rosuvastatin (CRESTOR) 40 MG tablet; Take 1 tablet (40 mg total) by mouth daily.  Dispense: 90 tablet; Refill: 3  5. Prediabetes - Consider metformi  - CBC with Differential/Platelet; Future - Comprehensive metabolic panel; Future - Lipid panel; Future - TSH; Future - Hemoglobin A1c; Future  6. Obesity, class 1 - Continue to stay active and eat healthy  - CBC with Differential/Platelet; Future - Comprehensive metabolic panel; Future - Lipid panel; Future - TSH; Future - Hemoglobin A1c; Future  7. Need for shingles vaccine  - Zoster Recombinant (Shingrix )  8. Need for influenza vaccination  - Flu vaccine trivalent PF, 6mos and older(Flulaval,Afluria,Fluarix,Fluzone)  Shirline Frees, NP

## 2023-12-03 ENCOUNTER — Ambulatory Visit (HOSPITAL_COMMUNITY)
Admission: RE | Admit: 2023-12-03 | Discharge: 2023-12-03 | Disposition: A | Discharge status: Home / Self Care | Payer: 59 | Source: Ambulatory Visit | Attending: Emergency Medicine | Admitting: Emergency Medicine

## 2023-12-03 ENCOUNTER — Encounter (HOSPITAL_COMMUNITY): Payer: Self-pay

## 2023-12-03 VITALS — BP 157/83 | HR 68 | Temp 97.7°F | Resp 18

## 2023-12-03 DIAGNOSIS — R350 Frequency of micturition: Secondary | ICD-10-CM | POA: Diagnosis not present

## 2023-12-03 DIAGNOSIS — R1030 Lower abdominal pain, unspecified: Secondary | ICD-10-CM | POA: Diagnosis not present

## 2023-12-03 DIAGNOSIS — J309 Allergic rhinitis, unspecified: Secondary | ICD-10-CM | POA: Insufficient documentation

## 2023-12-03 DIAGNOSIS — R110 Nausea: Secondary | ICD-10-CM | POA: Insufficient documentation

## 2023-12-03 LAB — POCT URINALYSIS DIP (MANUAL ENTRY)
Bilirubin, UA: NEGATIVE
Glucose, UA: NEGATIVE mg/dL
Leukocytes, UA: NEGATIVE
Nitrite, UA: NEGATIVE
Protein Ur, POC: 30 mg/dL — AB
Spec Grav, UA: 1.02 (ref 1.010–1.025)
Urobilinogen, UA: 0.2 U/dL
pH, UA: 6 (ref 5.0–8.0)

## 2023-12-03 MED ORDER — METRONIDAZOLE 500 MG PO TABS: 500.0000 mg | ORAL_TABLET | Freq: Two times a day (BID) | ORAL | 0 refills | Status: AC

## 2023-12-03 MED ORDER — MOMETASONE FUROATE 50 MCG/ACT NA SUSP: 2.0000 | Freq: Every day | NASAL | 12 refills | Status: AC

## 2023-12-03 MED ORDER — LEVOCETIRIZINE DIHYDROCHLORIDE 5 MG PO TABS: 5.0000 mg | ORAL_TABLET | Freq: Every evening | ORAL | 1 refills | Status: AC

## 2023-12-03 NOTE — Discharge Instructions (Addendum)
Your symptoms and my physical exam findings are concerning for exacerbation of your underlying allergies.     Please read below to learn more about the medications, dosages and frequencies that I recommend to help alleviate your symptoms and to get you feeling better soon:  Xyzal (levocetirizine): This is an excellent second-generation antihistamine that helps to reduce respiratory inflammatory response to environmental allergens.  In some patients, this medication can cause daytime sleepiness so I recommend that you take 1 tablet daily at bedtime.     Nasonex (mometasone): This is a steroid nasal spray that used once daily, 1 spray in each nare.  This works best when used on a daily basis. This medication does not work well if it is only used when you think you need it.  After 3 to 5 days of use, you will notice significant reduction of the inflammation and mucus production that is currently being caused by exposure to allergens, whether seasonal or environmental.  The most common side effect of this medication is nosebleeds.  If you experience a nosebleed, please discontinue use for 1 week, then feel free to resume.   If you find that your insurance will not pay for this medication, please consider a different nasal steroids such as Flonase (fluticasone), or Nasacort (triamcinolone).   Common causes of urinary tract infections include but are not limited to holding your urine longer than you should, squatting instead of sitting down when urinating, sitting around in wet clothing such as a wet swimsuit or gym clothes too long, not emptying your bladder after having sexual intercourse, wiping from back to front instead of front to back after having a bowel movement.     Less common causes of urinary tract infections include but are not limited to anatomical shifts in the location of your bladder or uterus causing obstruction of passage of urine from your bladder to your urethra where your urine comes out  or prolapse of your rectum into your vaginal wall.  These less common causes can be evaluated by gynecologist, a urologist or subspecialist called a uro-gynecologist   The urinalysis that we performed in the clinic today was abnormal.  Urine culture will be performed per our protocol.  The result of the urine culture will be available in the next 3 to 5 days and will be posted to your MyChart account.  If there is an abnormal finding, you will be contacted by phone and advised of further treatment recommendations, if any.  The results of your vaginal swab test which screens for BV, yeast, gonorrhea, chlamydia and trichomonas will be posted to your MyChart account once it is complete.  This typically takes 2 to 4 days.      Based on the symptoms and concerns you shared with me today, as well as your history of frequent episodes of bacterial vaginosis, you we will be treated for presumed bacterial vaginosis with metronidazole 500 mg twice daily for the next 7 days.  Please abstain from sexual intercourse while you are being treated.    If you have not had complete resolution of your symptoms after completing any recommended treatment or if your symptoms worsen, please return for repeat evaluation.     Thank you for visiting [facility] today.  We appreciate the opportunity to participate in your care.

## 2023-12-03 NOTE — ED Provider Notes (Signed)
[facility]    CSN: [id number] Arrival date & time: 12/03/23  1431    HISTORY   Chief Complaint  Patient presents with   Nausea    Entered by patient   Cough   HPI Mikayla Sweeney is a pleasant, 62 y.o. female who presents to urgent care today. Patient complains of lower abdominal pressure, lower back pain and urinary frequency for the past 4 days.  Patient also complains of nonproductive cough, sore throat, headache, nasal congestion, clear rhinorrhea and nausea for the past 5 days.  Urine dip today is unremarkable revealing trace ketones, red blood cells and a small amount of protein.  Patient states she frequently gets urinary tract infections and vaginal infections, is requesting vaginal testing today as well.  Patient denies vaginal discharge, vaginal irritation, fever, body aches, chills, vomiting, diarrhea, known sick contacts.  Patient has a history of allergies and asthma, not currently using any albuterol or taking any allergy medications.  EMR reviewed, patient has a history of frequent episodes of BV.  The history is provided by the patient.     Past Medical History:  Diagnosis Date   Anxiety    Asthma    B12 deficiency    Bipolar 1 disorder (HCC)    CHF (congestive heart failure) (HCC)    Epilepsy (HCC)    Hypertension    Sarcoidosis    Patient Active Problem List   Diagnosis Date Noted   Acute diastolic heart failure (HCC) 07/27/2014   Pulmonary edema 07/26/2014   Essential hypertension 07/26/2014   Sarcoidosis 07/26/2014   Bipolar 1 disorder (HCC) 07/26/2014   Headache 07/26/2014   Past Surgical History:  Procedure Laterality Date   COLONOSCOPY  2001   [facility]   COLONOSCOPY WITH PROPOFOL  06/10/2023   [facility]   TONSILLECTOMY     TUBAL LIGATION     OB History   No obstetric history on file.    Home Medications    Prior to Admission medications   Medication Sig Start Date End Date Taking? Authorizing Provider  albuterol  (PROVENTIL HFA;VENTOLIN HFA) 108 (90 Base) MCG/ACT inhaler Inhale 1-2 puffs into the lungs every 6 (six) hours as needed. For wheeze or shortness of breath Patient not taking: Reported on 05/21/2023 12/14/18   Belinda Fisher, PA-C  amLODipine (NORVASC) 10 MG tablet Take 1 tablet (10 mg total) by mouth daily. 10/03/23   [name], [name], NP  carvedilol (COREG) 25 MG tablet Take 1 tablet (25 mg total) by mouth 2 (two) times daily with a meal. 10/03/23   [name], [name], NP  fluticasone (FLONASE) 50 MCG/ACT nasal spray Place 2 sprays into both nostrils daily. Patient not taking: Reported on 05/21/2023 12/14/18   Belinda Fisher, PA-C  furosemide (LASIX) 20 MG tablet Take 1 tablet (20 mg total) by mouth daily. 10/03/23   [name], [name], NP  hydrochlorothiazide (HYDRODIURIL) 25 MG tablet Take 1 tablet (25 mg total) by mouth daily. 10/03/23   [name], [name], NP  losartan (COZAAR) 100 MG tablet Take 1 tablet (100 mg total) by mouth daily. 10/03/23 01/01/24  [name], [name], NP  ondansetron (ZOFRAN-ODT) 4 MG disintegrating tablet Take 1 tablet (4 mg total) by mouth every 8 (eight) hours as needed for nausea or vomiting. 10/03/23   [name], [name], NP  rosuvastatin (CRESTOR) 40 MG tablet Take 1 tablet (40 mg total) by mouth daily. 10/03/23   Shirline Frees, NP    Family History Family History  Problem Relation Age  of Onset   Pancreatic cancer Mother    Hypertension Mother    Diabetes Mother    Heart disease Father    Gout Father    Cirrhosis Father    Heart disease Sister        CHF   Colon cancer Neg Hx    Esophageal cancer Neg Hx    Rectal cancer Neg Hx    Stomach cancer Neg Hx    Social History Social History   Tobacco Use   Smoking status: Former    Types: Cigarettes   Smokeless tobacco: Never  Vaping Use   Vaping status: Never Used  Substance Use Topics   Alcohol use: No   Drug use: No   Allergies   Patient has no known allergies.  Review of Systems Review of Systems Pertinent findings revealed  after performing a 14 point review of systems has been noted in the history of present illness.  Physical Exam Vital Signs BP (!) 157/83 (BP Location: Right Arm)   Pulse 68   Temp 97.7 F (36.5 C) (Oral)   Resp 18   LMP 12/10/2014   SpO2 96%   No data found.  Physical Exam Vitals and nursing note reviewed.  Constitutional:      General: She is not in acute distress.    Appearance: Normal appearance. She is not ill-appearing.  HENT:     Head: Normocephalic and atraumatic.     Salivary Glands: Right salivary gland is not diffusely enlarged or tender. Left salivary gland is not diffusely enlarged or tender.     Right Ear: Ear canal and external ear normal. No drainage. A middle ear effusion is present. There is no impacted cerumen. Tympanic membrane is bulging. Tympanic membrane is not injected or erythematous.     Left Ear: Ear canal and external ear normal. No drainage. A middle ear effusion is present. There is no impacted cerumen. Tympanic membrane is bulging. Tympanic membrane is not injected or erythematous.     Ears:     Comments: Bilateral EACs normal, both TMs bulging with clear fluid    Nose: Rhinorrhea present. No nasal deformity, septal deviation, signs of injury, nasal tenderness, mucosal edema or congestion. Rhinorrhea is clear.     Right Nostril: Occlusion present. No foreign body, epistaxis or septal hematoma.     Left Nostril: Occlusion present. No foreign body, epistaxis or septal hematoma.     Right Turbinates: Enlarged, swollen and pale.     Left Turbinates: Enlarged, swollen and pale.     Right Sinus: No maxillary sinus tenderness or frontal sinus tenderness.     Left Sinus: No maxillary sinus tenderness or frontal sinus tenderness.     Mouth/Throat:     Lips: Pink. No lesions.     Mouth: Mucous membranes are moist. No oral lesions.     Pharynx: Oropharynx is clear. Uvula midline. No posterior oropharyngeal erythema or uvula swelling.     Tonsils: No tonsillar  exudate. 0 on the right. 0 on the left.     Comments: Postnasal drip Eyes:     General: Lids are normal.        Right eye: No discharge.        Left eye: No discharge.     Extraocular Movements: Extraocular movements intact.     Conjunctiva/sclera: Conjunctivae normal.     Right eye: Right conjunctiva is not injected.     Left eye: Left conjunctiva is not injected.  Neck:  Trachea: Trachea and phonation normal.  Cardiovascular:     Rate and Rhythm: Normal rate and regular rhythm.     Pulses: Normal pulses.     Heart sounds: Normal heart sounds. No murmur heard.    No friction rub. No gallop.  Pulmonary:     Effort: Pulmonary effort is normal. No accessory muscle usage, prolonged expiration or respiratory distress.     Breath sounds: Normal breath sounds. No stridor, decreased air movement or transmitted upper airway sounds. No decreased breath sounds, wheezing, rhonchi or rales.  Chest:     Chest wall: No tenderness.  Abdominal:     General: Abdomen is flat. Bowel sounds are normal. There is no distension.     Palpations: Abdomen is soft.     Tenderness: There is no abdominal tenderness.  Genitourinary:    Comments: Patient politely declines pelvic exam today, patient provided a vaginal swab for testing. Musculoskeletal:        General: Normal range of motion.     Cervical back: Normal range of motion and neck supple. Normal range of motion.  Lymphadenopathy:     Cervical: No cervical adenopathy.  Skin:    General: Skin is warm and dry.     Findings: No erythema or rash.  Neurological:     General: No focal deficit present.     Mental Status: She is alert and oriented to person, place, and time.  Psychiatric:        Mood and Affect: Mood normal.        Behavior: Behavior normal.     Visual Acuity Right Eye Distance:   Left Eye Distance:   Bilateral Distance:    Right Eye Near:   Left Eye Near:    Bilateral Near:     UC Couse / Diagnostics / Procedures:      Radiology No results found.  Procedures Procedures (including critical care time) EKG  Pending results:  Labs Reviewed  POCT URINALYSIS DIP (MANUAL ENTRY) - Abnormal; Notable for the following components:      Result Value   Ketones, POC UA trace (5) (*)    Blood, UA trace-intact (*)    Protein Ur, POC =30 (*)    All other components within normal limits  CERVICOVAGINAL ANCILLARY ONLY    Medications Ordered in UC: Medications - No data to display  UC Diagnoses / Final Clinical Impressions(s)   I have reviewed the triage vital signs and the nursing notes.  Pertinent labs & imaging results that were available during my care of the patient were reviewed by me and considered in my medical decision making (see chart for details).    Final diagnoses:  Allergic rhinitis, unspecified seasonality, unspecified trigger  Nausea without vomiting  Lower abdominal pain  Increased frequency of urination   Patient was provided with Metronidazole 500 mg twice daily for 7 days for empiric treatment of presumed gardnerella BV based on the history provided to me today, patient's frequent episodes of BV and because we will be closed tomorrow and the result of her vaginal cytology will be delayed.   Patient was advised to abstain from sexual intercourse for the next 7 days while being treated.  Patient was also advised to use condoms to protect themselves from STD exposure.  Urine dip today revealed microscopic hematuria and protein.  Urine culture will be performed per our protocol.  Will provide patient with antibiotic treatment as needed based on urine culture result.  Patient advised physical exam  findings are concerning for uncontrolled allergies, recommend she begin a second-generation antihistamine and nasal steroid spray.  Prescription sent to pharmacy.  Conservative care recommended.  Return precautions advised.  Please see discharge instructions below for details of plan of care as  provided to patient. ED Prescriptions     Medication Sig Dispense Auth. Provider   levocetirizine (XYZAL) 5 MG tablet Take 1 tablet (5 mg total) by mouth every evening. 90 tablet Theadora Rama Scales, PA-C   mometasone (NASONEX) 50 MCG/ACT nasal spray Place 2 sprays into the nose daily. 1 each Theadora Rama Scales, PA-C   metroNIDAZOLE (FLAGYL) 500 MG tablet Take 1 tablet (500 mg total) by mouth 2 (two) times daily for 7 days. 14 tablet Theadora Rama Scales, PA-C      PDMP not reviewed this encounter.  Disposition Upon Discharge:  Condition: stable for discharge home  Patient presented with concern for an acute illness with associated systemic symptoms and significant discomfort requiring urgent management. In my opinion, this is a condition that a prudent lay person (someone who possesses an average knowledge of health and medicine) may potentially expect to result in complications if not addressed urgently such as respiratory distress, impairment of bodily function or dysfunction of bodily organs.   As such, the patient has been evaluated and assessed, work-up was performed and treatment was provided in alignment with urgent care protocols and evidence based medicine.  Patient/parent/caregiver has been advised that the patient may require follow up for further testing and/or treatment if the symptoms continue in spite of treatment, as clinically indicated and appropriate.  Routine symptom specific, illness specific and/or disease specific instructions were discussed with the patient and/or caregiver at length.  Prevention strategies for avoiding STD exposure were also discussed.  The patient will follow up with their current PCP if and as advised. If the patient does not currently have a PCP we will assist them in obtaining one.   The patient may need specialty follow up if the symptoms continue, in spite of conservative treatment and management, for further workup, evaluation,  consultation and treatment as clinically indicated and appropriate.  Patient/parent/caregiver verbalized understanding and agreement of plan as discussed.  All questions were addressed during visit.  Please see discharge instructions below for further details of plan.    Discharge Instructions      Your symptoms and my physical exam findings are concerning for exacerbation of your underlying allergies.     Please read below to learn more about the medications, dosages and frequencies that I recommend to help alleviate your symptoms and to get you feeling better soon:  Xyzal (levocetirizine): This is an excellent second-generation antihistamine that helps to reduce respiratory inflammatory response to environmental allergens.  In some patients, this medication can cause daytime sleepiness so I recommend that you take 1 tablet daily at bedtime.     Nasonex (mometasone): This is a steroid nasal spray that used once daily, 1 spray in each nare.  This works best when used on a daily basis. This medication does not work well if it is only used when you think you need it.  After 3 to 5 days of use, you will notice significant reduction of the inflammation and mucus production that is currently being caused by exposure to allergens, whether seasonal or environmental.  The most common side effect of this medication is nosebleeds.  If you experience a nosebleed, please discontinue use for 1 week, then feel free to resume.   If you  find that your insurance will not pay for this medication, please consider a different nasal steroids such as Flonase (fluticasone), or Nasacort (triamcinolone).   Common causes of urinary tract infections include but are not limited to holding your urine longer than you should, squatting instead of sitting down when urinating, sitting around in wet clothing such as a wet swimsuit or gym clothes too long, not emptying your bladder after having sexual intercourse, wiping from back to  front instead of front to back after having a bowel movement.     Less common causes of urinary tract infections include but are not limited to anatomical shifts in the location of your bladder or uterus causing obstruction of passage of urine from your bladder to your urethra where your urine comes out or prolapse of your rectum into your vaginal wall.  These less common causes can be evaluated by gynecologist, a urologist or subspecialist called a uro-gynecologist   The urinalysis that we performed in the clinic today was abnormal.  Urine culture will be performed per our protocol.  The result of the urine culture will be available in the next 3 to 5 days and will be posted to your MyChart account.  If there is an abnormal finding, you will be contacted by phone and advised of further treatment recommendations, if any.  The results of your vaginal swab test which screens for BV, yeast, gonorrhea, chlamydia and trichomonas will be posted to your MyChart account once it is complete.  This typically takes 2 to 4 days.      Based on the symptoms and concerns you shared with me today, as well as your history of frequent episodes of bacterial vaginosis, you we will be treated for presumed bacterial vaginosis with metronidazole 500 mg twice daily for the next 7 days.  Please abstain from sexual intercourse while you are being treated.    If you have not had complete resolution of your symptoms after completing any recommended treatment or if your symptoms worsen, please return for repeat evaluation.     Thank you for visiting [facility] today.  We appreciate the opportunity to participate in your care.          This office note has been dictated using Teaching laboratory technician.  Unfortunately, this method of dictation can sometimes lead to typographical or grammatical errors.  I apologize for your inconvenience in advance if this occurs.  Please do not hesitate to reach out to  me if clarification is needed.       Theadora Rama Scales, PA-C 12/03/23 1526

## 2023-12-03 NOTE — ED Triage Notes (Signed)
Pt c/o cough, sore throat, headache, nasal congestion, and nausea x5 days.  Pt c/o lower abdominal pressure, lower back pain, and urinary frequency x4 days.

## 2023-12-05 ENCOUNTER — Telehealth: Payer: Self-pay | Admitting: Emergency Medicine

## 2023-12-05 DIAGNOSIS — R300 Dysuria: Secondary | ICD-10-CM

## 2023-12-05 LAB — CERVICOVAGINAL ANCILLARY ONLY
Bacterial Vaginitis (gardnerella): NEGATIVE
Candida Glabrata: NEGATIVE
Candida Vaginitis: NEGATIVE
Chlamydia: NEGATIVE
Comment: NEGATIVE
Comment: NEGATIVE
Comment: NEGATIVE
Comment: NEGATIVE
Comment: NEGATIVE
Comment: NORMAL
Neisseria Gonorrhea: NEGATIVE
Trichomonas: NEGATIVE

## 2023-12-05 NOTE — Telephone Encounter (Signed)
Urine culture was not ordered during visit.  Patient advised that she can return for nurse visit to provide second urine sample.  Urine culture ordered.

## 2023-12-18 ENCOUNTER — Ambulatory Visit (HOSPITAL_COMMUNITY)
Admission: EM | Admit: 2023-12-18 | Discharge: 2023-12-18 | Disposition: A | Discharge status: Home / Self Care | Payer: 59 | Attending: Internal Medicine | Admitting: Internal Medicine

## 2023-12-18 ENCOUNTER — Ambulatory Visit: Payer: 59 | Admitting: Internal Medicine

## 2023-12-18 ENCOUNTER — Encounter (HOSPITAL_COMMUNITY): Payer: Self-pay

## 2023-12-18 DIAGNOSIS — J0100 Acute maxillary sinusitis, unspecified: Secondary | ICD-10-CM | POA: Insufficient documentation

## 2023-12-18 DIAGNOSIS — R350 Frequency of micturition: Secondary | ICD-10-CM | POA: Insufficient documentation

## 2023-12-18 LAB — POCT URINALYSIS DIP (MANUAL ENTRY)
Bilirubin, UA: NEGATIVE
Blood, UA: NEGATIVE
Glucose, UA: NEGATIVE mg/dL
Ketones, POC UA: NEGATIVE mg/dL
Leukocytes, UA: NEGATIVE
Nitrite, UA: NEGATIVE
Protein Ur, POC: NEGATIVE mg/dL
Spec Grav, UA: 1.02 (ref 1.010–1.025)
Urobilinogen, UA: 0.2 U/dL
pH, UA: 7 (ref 5.0–8.0)

## 2023-12-18 MED ORDER — AMOXICILLIN-POT CLAVULANATE 875-125 MG PO TABS: 1.0000 | ORAL_TABLET | Freq: Two times a day (BID) | ORAL | 0 refills | Status: DC

## 2023-12-18 NOTE — ED Provider Notes (Signed)
 [facility]    CSN: [id number] Arrival date & time: 12/18/23  1636      History   Chief Complaint Chief Complaint  Patient presents with   Nausea   Headache   Back Pain   Urinary Frequency    HPI Mikayla Sweeney is a 63 y.o. female.   Patient presents to clinic complaining of a headache, sinus pressure and sinus drainage.  Sinus symptoms have been present for the past 2 weeks.  She was seen in clinic and diagnosed with rhinitis, used the Flonase  without much improvement.  The headache has been a new development over the past few days.  She has been having some flank pain and urinary frequency with nausea.  Has a cough that is productive with mucus.  Feels like she may be wheezing at night.  No shortness of breath.  Has not had any fevers.  No hot or cold chills.  No dysuria.    The history is provided by the patient and medical records.  Headache Back Pain Urinary Frequency    Past Medical History:  Diagnosis Date   Anxiety    Asthma    B12 deficiency    Bipolar 1 disorder (HCC)    CHF (congestive heart failure) (HCC)    Epilepsy (HCC)    Hypertension    Sarcoidosis     Patient Active Problem List   Diagnosis Date Noted   Acute diastolic heart failure (HCC) 07/27/2014   Pulmonary edema 07/26/2014   Essential hypertension 07/26/2014   Sarcoidosis 07/26/2014   Bipolar 1 disorder (HCC) 07/26/2014   Headache 07/26/2014    Past Surgical History:  Procedure Laterality Date   COLONOSCOPY  2001   [facility]   COLONOSCOPY WITH PROPOFOL   06/10/2023   [facility] Cirigliano at [facility]   TONSILLECTOMY     TUBAL LIGATION      OB History   No obstetric history on file.      Home Medications    Prior to Admission medications   Medication Sig Start Date End Date Taking? Authorizing Provider  amoxicillin -clavulanate (AUGMENTIN ) 875-125 MG tablet Take 1 tablet by mouth every 12 (twelve) hours. 12/18/23  Yes Pat Elicker  N, FNP  amLODipine  (NORVASC ) 10 MG  tablet Take 1 tablet (10 mg total) by mouth daily. 10/03/23   [name], Darleene, NP  carvedilol  (COREG ) 25 MG tablet Take 1 tablet (25 mg total) by mouth 2 (two) times daily with a meal. 10/03/23   [name], Darleene, NP  furosemide  (LASIX ) 20 MG tablet Take 1 tablet (20 mg total) by mouth daily. 10/03/23   [name], Darleene, NP  hydrochlorothiazide  (HYDRODIURIL ) 25 MG tablet Take 1 tablet (25 mg total) by mouth daily. 10/03/23   [name], Darleene, NP  levocetirizine (XYZAL ) 5 MG tablet Take 1 tablet (5 mg total) by mouth every evening. 12/03/23 05/31/24  Joesph Shaver Scales, PA-C  losartan  (COZAAR ) 100 MG tablet Take 1 tablet (100 mg total) by mouth daily. 10/03/23 01/01/24  [name], Darleene, NP  mometasone  (NASONEX ) 50 MCG/ACT nasal spray Place 2 sprays into the nose daily. 12/03/23   Joesph Shaver Scales, PA-C  ondansetron  (ZOFRAN -ODT) 4 MG disintegrating tablet Take 1 tablet (4 mg total) by mouth every 8 (eight) hours as needed for nausea or vomiting. 10/03/23   [name], Darleene, NP  rosuvastatin  (CRESTOR ) 40 MG tablet Take 1 tablet (40 mg total) by mouth daily. 10/03/23   Merna Darleene, NP    Family History Family History  Problem Relation Age of Onset  Pancreatic cancer Mother    Hypertension Mother    Diabetes Mother    Heart disease Father    Gout Father    Cirrhosis Father    Heart disease Sister        CHF   Colon cancer Neg Hx    Esophageal cancer Neg Hx    Rectal cancer Neg Hx    Stomach cancer Neg Hx     Social History Social History   Tobacco Use   Smoking status: Former    Types: Cigarettes   Smokeless tobacco: Never  Vaping Use   Vaping status: Never Used  Substance Use Topics   Alcohol use: No   Drug use: No     Allergies   Patient has no known allergies.   Review of Systems Review of Systems  Per HPI   Physical Exam Triage Vital Signs ED Triage Vitals  Encounter Vitals Group     BP 12/18/23 1742 (!) 157/70     Systolic BP Percentile --      Diastolic  BP Percentile --      Pulse Rate 12/18/23 1742 60     Resp 12/18/23 1742 16     Temp 12/18/23 1742 98 F (36.7 C)     Temp Source 12/18/23 1742 Oral     SpO2 12/18/23 1742 96 %     Weight --      Height --      Head Circumference --      Peak Flow --      Pain Score 12/18/23 1743 7     Pain Loc --      Pain Education --      Exclude from Growth Chart --    No data found.  Updated Vital Signs BP (!) 157/70 (BP Location: Right Arm)   Pulse 60   Temp 98 F (36.7 C) (Oral)   Resp 16   LMP 12/10/2014   SpO2 96%   Visual Acuity Right Eye Distance:   Left Eye Distance:   Bilateral Distance:    Right Eye Near:   Left Eye Near:    Bilateral Near:     Physical Exam Vitals and nursing note reviewed.  Constitutional:      Appearance: Normal appearance. She is well-developed.  HENT:     Head: Normocephalic and atraumatic.     Right Ear: External ear normal.     Left Ear: External ear normal.     Nose: Nose normal.     Mouth/Throat:     Mouth: Mucous membranes are moist.  Eyes:     General: No scleral icterus. Cardiovascular:     Rate and Rhythm: Normal rate and regular rhythm.     Heart sounds: Normal heart sounds. No murmur heard. Pulmonary:     Effort: Pulmonary effort is normal. No respiratory distress.     Breath sounds: Normal breath sounds.  Abdominal:     Tenderness: There is no right CVA tenderness or left CVA tenderness.  Skin:    General: Skin is warm and dry.  Neurological:     General: No focal deficit present.     Mental Status: She is alert and oriented to person, place, and time.  Psychiatric:        Mood and Affect: Mood normal.        Behavior: Behavior normal.      UC Treatments / Results  Labs (all labs ordered are listed, but only abnormal results are displayed) Labs  Reviewed  POCT URINALYSIS DIP (MANUAL ENTRY) - Abnormal; Notable for the following components:      Result Value   Clarity, UA cloudy (*)    All other components within  normal limits  URINE CULTURE    EKG   Radiology No results found.  Procedures Procedures (including critical care time)  Medications Ordered in UC Medications - No data to display  Initial Impression / Assessment and Plan / UC Course  I have reviewed the triage vital signs and the nursing notes.  Pertinent labs & imaging results that were available during my care of the patient were reviewed by me and considered in my medical decision making (see chart for details).  Vitals and triage reviewed, patient is hemodynamically stable.  Lungs are vesicular, heart with regular rate and rhythm.  Maxillary sinus tenderness to palpation.  Symptoms have been present for the past 2 weeks and now worsening with headache.  Will cover with Augmentin  for bacterial sinusitis.  Urinalysis overall unremarkable.  Negative CVA tenderness.  Afebrile without tachycardia, low concern for pyelonephritis.  Will send off urine for culture due to urgency, frequency and flank pain.  Plan of care, follow-up care return precautions given, no questions at this time.     Final Clinical Impressions(s) / UC Diagnoses   Final diagnoses:  Acute maxillary sinusitis, recurrence not specified  Urinary frequency     Discharge Instructions      Take the antibiotics twice daily with food until finished.  You can sleep with a humidifier or do steamy showers to help loosen up secretions.  Ensure you are drinking at least 64 ounces of water daily.  Your urine did not show any infection today, we are sending this off for culture and we will contact you if your urine requires treatment.  Follow-up with your primary care provider or return to clinic if no improvement in symptoms despite finishing antibiotics.  Return to clinic for new or urgent symptoms.      ED Prescriptions     Medication Sig Dispense Auth. Provider   amoxicillin -clavulanate (AUGMENTIN ) 875-125 MG tablet Take 1 tablet by mouth every 12 (twelve)  hours. 14 tablet Dreama, Jerrika Ledlow  N, FNP      PDMP not reviewed this encounter.   Dreama, Rondi Ivy  N, FNP 12/18/23 1825

## 2023-12-18 NOTE — ED Notes (Signed)
 Patient's O2 concentrator was empty upon arrival to the room. Room air sats-82%. Patient placed on O2 #L/min via [place] from the room.

## 2023-12-18 NOTE — Discharge Instructions (Signed)
 Take the antibiotics twice daily with food until finished.  You can sleep with a humidifier or do steamy showers to help loosen up secretions.  Ensure you are drinking at least 64 ounces of water daily.  Your urine did not show any infection today, we are sending this off for culture and we will contact you if your urine requires treatment.  Follow-up with your primary care provider or return to clinic if no improvement in symptoms despite finishing antibiotics.  Return to clinic for new or urgent symptoms.

## 2023-12-18 NOTE — ED Triage Notes (Signed)
 Patient c/o urinary frequency, nausea, headache, and right lower back pain x 2-3 days.  Patient added that she has nasal congestion as well.

## 2023-12-19 LAB — URINE CULTURE: Culture: NO GROWTH

## 2023-12-31 ENCOUNTER — Ambulatory Visit (INDEPENDENT_AMBULATORY_CARE_PROVIDER_SITE_OTHER): Payer: 59 | Admitting: Adult Health

## 2023-12-31 ENCOUNTER — Encounter: Payer: Self-pay | Admitting: Adult Health

## 2023-12-31 VITALS — BP 128/80 | Temp 98.0°F | Ht 63.0 in | Wt 189.0 lb

## 2023-12-31 DIAGNOSIS — Z6833 Body mass index (BMI) 33.0-33.9, adult: Secondary | ICD-10-CM

## 2023-12-31 DIAGNOSIS — E8882 Obesity due to disruption of MC4R pathway: Secondary | ICD-10-CM

## 2023-12-31 DIAGNOSIS — E66811 Obesity, class 1: Secondary | ICD-10-CM

## 2023-12-31 DIAGNOSIS — Z152 Genetic susceptibility to obesity: Secondary | ICD-10-CM

## 2023-12-31 NOTE — Progress Notes (Signed)
Subjective:    Patient ID: Mikayla Sweeney, female    DOB: 1961-01-11, 63 y.o.   MRN: [id number]  HPI 63 year old female who  has a past medical history of Anxiety, Asthma, B12 deficiency, Bipolar 1 disorder (HCC), CHF (congestive heart failure) (HCC), Epilepsy (HCC), Hypertension, and Sarcoidosis.  She presents to the office today to discuss weight loss medication.  She is interested on being on a GLP-1 analog.  In the past she was on [facility] when we could prescribe it for weight loss but once insurance company stopped approving it she could no longer be on it.  She is interested in going back on Zepbound or [place].  She e is working on getting back in shape and working with a Best boy Readings from Last 3 Encounters:  12/31/23 189 lb (85.7 kg)  10/03/23 193 lb (87.5 kg)  07/31/23 201 lb (91.2 kg)     Review of Systems See HPI   Past Medical History:  Diagnosis Date   Anxiety    Asthma    B12 deficiency    Bipolar 1 disorder (HCC)    CHF (congestive heart failure) (HCC)    Epilepsy (HCC)    Hypertension    Sarcoidosis     Social History   Socioeconomic History   Marital status: Married    Spouse name: Not on file   Number of children: Not on file   Years of education: Not on file   Highest education level: Not on file  Occupational History   Not on file  Tobacco Use   Smoking status: Former    Types: Cigarettes   Smokeless tobacco: Never  Vaping Use   Vaping status: Never Used  Substance and Sexual Activity   Alcohol use: No   Drug use: No   Sexual activity: Not Currently    Birth control/protection: None  Other Topics Concern   Not on file  Social History Narrative   She has her own business    Married    Three children    5 grandchildren       She likes to spend time with her grandchildren    Social Drivers of Corporate investment banker Strain: Low Risk  (07/31/2023)   Overall Financial Resource Strain (CARDIA)    Difficulty of  Paying Living Expenses: Not hard at all  Food Insecurity: No Food Insecurity (07/31/2023)   Hunger Vital Sign    Worried About Running Out of Food in the Last Year: Never true    Ran Out of Food in the Last Year: Never true  Transportation Needs: No Transportation Needs (07/31/2023)   PRAPARE - Administrator, Civil Service (Medical): No    Lack of Transportation (Non-Medical): No  Physical Activity: Inactive (07/31/2023)   Exercise Vital Sign    Days of Exercise per Week: 0 days    Minutes of Exercise per Session: 0 min  Stress: No Stress Concern Present (07/31/2023)   [employer] of Occupational Health - Occupational Stress Questionnaire    Feeling of Stress : Not at all  Social Connections: Moderately Isolated (07/31/2023)   Social Connection and Isolation Panel [NHANES]    Frequency of Communication with Friends and Family: More than three times a week    Frequency of Social Gatherings with Friends and Family: More than three times a week    Attends Religious Services: Never    Database administrator or Organizations: Yes  Attends Banker Meetings: More than 4 times per year    Marital Status: Divorced  Intimate Partner Violence: Not At Risk (07/31/2023)   Humiliation, Afraid, Rape, and Kick questionnaire    Fear of Current or Ex-Partner: No    Emotionally Abused: No    Physically Abused: No    Sexually Abused: No    Past Surgical History:  Procedure Laterality Date   COLONOSCOPY  2001   [facility]   COLONOSCOPY WITH PROPOFOL  06/10/2023   [facility]igl[facility] at [facility]   TONSILLECTOMY     TUBAL LIGATION      Family History  Problem Relation Age of Onset   Pancreatic cancer Mother    Hypertension Mother    Diabetes Mother    Heart disease Father    Gout Father    Cirrhosis Father    Heart disease Sister        CHF   Colon cancer Neg Hx    Esophageal cancer Neg Hx    Rectal cancer Neg Hx    Stomach cancer Neg Hx     No Known  Allergies  Current Outpatient Medications on File Prior to Visit  Medication Sig Dispense Refill   amLODipine (NORVASC) 10 MG tablet Take 1 tablet (10 mg total) by mouth daily. 90 tablet 3   carvedilol (COREG) 25 MG tablet Take 1 tablet (25 mg total) by mouth 2 (two) times daily with a meal. 180 tablet 3   furosemide (LASIX) 20 MG tablet Take 1 tablet (20 mg total) by mouth daily. 90 tablet 3   hydrochlorothiazide (HYDRODIURIL) 25 MG tablet Take 1 tablet (25 mg total) by mouth daily. 90 tablet 3   levocetirizine (XYZAL) 5 MG tablet Take 1 tablet (5 mg total) by mouth every evening. 90 tablet 1   losartan (COZAAR) 100 MG tablet Take 1 tablet (100 mg total) by mouth daily. 90 tablet 3   mometasone (NASONEX) 50 MCG/ACT nasal spray Place 2 sprays into the nose daily. 1 each 12   ondansetron (ZOFRAN-ODT) 4 MG disintegrating tablet Take 1 tablet (4 mg total) by mouth every 8 (eight) hours as needed for nausea or vomiting. 20 tablet 0   rosuvastatin (CRESTOR) 40 MG tablet Take 1 tablet (40 mg total) by mouth daily. 90 tablet 3   No current facility-administered medications on file prior to visit.    BP 128/80   Temp 98 F (36.7 C) (Oral)   Ht 5\' 3"  (1.6 m)   Wt 189 lb (85.7 kg)   LMP 12/10/2014   BMI 33.48 kg/m       Objective:   Physical Exam Vitals and nursing note reviewed.  Constitutional:      Appearance: Normal appearance. She is obese.  Cardiovascular:     Rate and Rhythm: Normal rate and regular rhythm.     Pulses: Normal pulses.     Heart sounds: Normal heart sounds.  Musculoskeletal:        General: Normal range of motion.  Skin:    General: Skin is warm and dry.  Neurological:     General: No focal deficit present.     Mental Status: She is alert and oriented to person, place, and time.  Psychiatric:        Mood and Affect: Mood normal.        Behavior: Behavior normal.        Thought Content: Thought content normal.        Assessment & Plan:  1.  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  She was advised to continue to work on weight loss through diet and exercise as she is doing a good job with this. - Follow up as needed Wt Readings from Last 3 Encounters:  12/31/23 189 lb (85.7 kg)  10/03/23 193 lb (87.5 kg)  07/31/23 201 lb (91.2 kg)   Shirline Frees, NP

## 2024-01-03 ENCOUNTER — Ambulatory Visit: Payer: 59

## 2024-02-10 ENCOUNTER — Encounter: Payer: Self-pay | Admitting: Gastroenterology

## 2024-04-14 ENCOUNTER — Encounter: Payer: Self-pay | Admitting: Adult Health

## 2024-04-14 ENCOUNTER — Ambulatory Visit (INDEPENDENT_AMBULATORY_CARE_PROVIDER_SITE_OTHER): Admitting: Adult Health

## 2024-04-14 VITALS — BP 110/80 | Temp 98.2°F | Wt 186.0 lb

## 2024-04-14 DIAGNOSIS — T148XXA Other injury of unspecified body region, initial encounter: Secondary | ICD-10-CM

## 2024-04-14 NOTE — Progress Notes (Signed)
 Subjective:    Patient ID: Mikayla Sweeney, female    DOB: 1960-12-24, 63 y.o.   MRN: [id number]  HPI 63 year old female who  has a past medical history of Anxiety, Asthma, B12 deficiency, Bipolar 1 disorder (HCC), CHF (congestive heart failure) (HCC), Epilepsy (HCC), Hypertension, and Sarcoidosis.  She presents to the office today for an acute issue. She reports that she has noticed bruising in different areas of her body over the last two weeks. She reports that the bruising is more of her arms and legs. She has been taking oregano oil with black seed oil. She does not take ASA. She has not had any accidents/ falls or any contributing factors.    Review of Systems See HPI   Past Medical History:  Diagnosis Date   Anxiety    Asthma    B12 deficiency    Bipolar 1 disorder (HCC)    CHF (congestive heart failure) (HCC)    Epilepsy (HCC)    Hypertension    Sarcoidosis     Social History   Socioeconomic History   Marital status: Married    Spouse name: Not on file   Number of children: Not on file   Years of education: Not on file   Highest education level: Not on file  Occupational History   Not on file  Tobacco Use   Smoking status: Former    Types: Cigarettes   Smokeless tobacco: Never  Vaping Use   Vaping status: Never Used  Substance and Sexual Activity   Alcohol use: No   Drug use: No   Sexual activity: Not Currently    Birth control/protection: None  Other Topics Concern   Not on file  Social History Narrative   She has her own business    Married    Three children    5 grandchildren       She likes to spend time with her grandchildren    Social Drivers of Corporate investment banker Strain: Low Risk  (07/31/2023)   Overall Financial Resource Strain (CARDIA)    Difficulty of Paying Living Expenses: Not hard at all  Food Insecurity: No Food Insecurity (07/31/2023)   Hunger Vital Sign    Worried About Running Out of Food in the Last Year: Never true     Ran Out of Food in the Last Year: Never true  Transportation Needs: No Transportation Needs (07/31/2023)   PRAPARE - Administrator, Civil Service (Medical): No    Lack of Transportation (Non-Medical): No  Physical Activity: Inactive (07/31/2023)   Exercise Vital Sign    Days of Exercise per Week: 0 days    Minutes of Exercise per Session: 0 min  Stress: No Stress Concern Present (07/31/2023)   [employer] of Occupational Health - Occupational Stress Questionnaire    Feeling of Stress : Not at all  Social Connections: Moderately Isolated (07/31/2023)   Social Connection and Isolation Panel [NHANES]    Frequency of Communication with Friends and Family: More than three times a week    Frequency of Social Gatherings with Friends and Family: More than three times a week    Attends Religious Services: Never    Database administrator or Organizations: Yes    Attends Engineer, structural: More than 4 times per year    Marital Status: Divorced  Intimate Partner Violence: Not At Risk (07/31/2023)   Humiliation, Afraid, Rape, and Kick questionnaire    Fear  of Current or Ex-Partner: No    Emotionally Abused: No    Physically Abused: No    Sexually Abused: No    Past Surgical History:  Procedure Laterality Date   COLONOSCOPY  2001   [facility]   COLONOSCOPY WITH PROPOFOL   06/10/2023   [facility] Cirigliano at [facility]   TONSILLECTOMY     TUBAL LIGATION      Family History  Problem Relation Age of Onset   Pancreatic cancer Mother    Hypertension Mother    Diabetes Mother    Heart disease Father    Gout Father    Cirrhosis Father    Heart disease Sister        CHF   Colon cancer Neg Hx    Esophageal cancer Neg Hx    Rectal cancer Neg Hx    Stomach cancer Neg Hx     No Known Allergies  Current Outpatient Medications on File Prior to Visit  Medication Sig Dispense Refill   amLODipine  (NORVASC ) 10 MG tablet Take 1 tablet (10 mg total) by mouth daily. 90 tablet 3    carvedilol  (COREG ) 25 MG tablet Take 1 tablet (25 mg total) by mouth 2 (two) times daily with a meal. 180 tablet 3   furosemide  (LASIX ) 20 MG tablet Take 1 tablet (20 mg total) by mouth daily. 90 tablet 3   hydrochlorothiazide  (HYDRODIURIL ) 25 MG tablet Take 1 tablet (25 mg total) by mouth daily. 90 tablet 3   levocetirizine (XYZAL ) 5 MG tablet Take 1 tablet (5 mg total) by mouth every evening. 90 tablet 1   losartan  (COZAAR ) 100 MG tablet Take 1 tablet (100 mg total) by mouth daily. 90 tablet 3   mometasone  (NASONEX ) 50 MCG/ACT nasal spray Place 2 sprays into the nose daily. 1 each 12   ondansetron  (ZOFRAN -ODT) 4 MG disintegrating tablet Take 1 tablet (4 mg total) by mouth every 8 (eight) hours as needed for nausea or vomiting. 20 tablet 0   rosuvastatin  (CRESTOR ) 40 MG tablet Take 1 tablet (40 mg total) by mouth daily. 90 tablet 3   No current facility-administered medications on file prior to visit.    BP 110/80   Temp 98.2 F (36.8 C)   Wt 186 lb (84.4 kg)   LMP 12/10/2014   BMI 32.95 kg/m       Objective:   Physical Exam Vitals and nursing note reviewed.  Constitutional:      Appearance: Normal appearance.  Cardiovascular:     Rate and Rhythm: Normal rate and regular rhythm.     Pulses: Normal pulses.     Heart sounds: Normal heart sounds.  Pulmonary:     Effort: Pulmonary effort is normal.     Breath sounds: Normal breath sounds.  Skin:    General: Skin is warm and dry.  Neurological:     General: No focal deficit present.     Mental Status: She is alert and oriented to person, place, and time.  Psychiatric:        Mood and Affect: Mood normal.        Behavior: Behavior normal.        Thought Content: Thought content normal.        Judgment: Judgment normal.       Assessment & Plan:  1. Bruising (Primary) - likely from OTC supplements but will check Vitamin B12, iron and CBC - Vitamin B12; Future - IBC + Ferritin; Future - CBC; Future  Alto Atta, NP

## 2024-04-15 ENCOUNTER — Encounter: Payer: Self-pay | Admitting: Adult Health

## 2024-04-15 LAB — CBC
HCT: 43.7 % (ref 36.0–46.0)
Hemoglobin: 14.4 g/dL (ref 12.0–15.0)
MCHC: 32.9 g/dL (ref 30.0–36.0)
MCV: 93.8 fl (ref 78.0–100.0)
Platelets: 219 10*3/uL (ref 150.0–400.0)
RBC: 4.65 Mil/uL (ref 3.87–5.11)
RDW: 13.9 % (ref 11.5–15.5)
WBC: 8.7 10*3/uL (ref 4.0–10.5)

## 2024-04-15 LAB — IBC + FERRITIN
Ferritin: 30.5 ng/mL (ref 10.0–291.0)
Iron: 112 ug/dL (ref 42–145)
Saturation Ratios: 29.1 % (ref 20.0–50.0)
TIBC: 385 ug/dL (ref 250.0–450.0)
Transferrin: 275 mg/dL (ref 212.0–360.0)

## 2024-04-15 LAB — VITAMIN B12: Vitamin B-12: 584 pg/mL (ref 211–911)

## 2024-04-28 ENCOUNTER — Other Ambulatory Visit: Payer: Self-pay | Admitting: Adult Health

## 2024-04-28 DIAGNOSIS — I10 Essential (primary) hypertension: Secondary | ICD-10-CM

## 2024-04-28 DIAGNOSIS — I5032 Chronic diastolic (congestive) heart failure: Secondary | ICD-10-CM

## 2024-04-29 ENCOUNTER — Telehealth: Payer: Self-pay | Admitting: Adult Health

## 2024-04-29 NOTE — Telephone Encounter (Signed)
Prescription was filled.

## 2024-04-29 NOTE — Telephone Encounter (Signed)
 Copied from CRM [id number]. Topic: Clinical - Medication Question >> Apr 29, 2024  9:19 AM Everlene Hobby D wrote: Patient is going out of town and says she needs to pick up her prescription today. I told her Refills can take up to 3 business days and her was called in yesterday. She wants me to send a message saying it's urgent.   It's currently Pending-Carvedilol  25 mg Oral 2 times daily with meals

## 2024-05-19 ENCOUNTER — Ambulatory Visit

## 2024-05-19 ENCOUNTER — Ambulatory Visit (INDEPENDENT_AMBULATORY_CARE_PROVIDER_SITE_OTHER): Admitting: Adult Health

## 2024-05-19 VITALS — BP 138/88 | HR 59 | Temp 98.3°F | Ht 63.0 in | Wt 189.0 lb

## 2024-05-19 DIAGNOSIS — M5412 Radiculopathy, cervical region: Secondary | ICD-10-CM

## 2024-05-19 DIAGNOSIS — M4802 Spinal stenosis, cervical region: Secondary | ICD-10-CM | POA: Diagnosis not present

## 2024-05-19 MED ORDER — METHYLPREDNISOLONE 4 MG PO TBPK: ORAL_TABLET | ORAL | 0 refills | Status: DC

## 2024-05-19 NOTE — Progress Notes (Signed)
 Subjective:    Patient ID: Mikayla Sweeney, female    DOB: 11-14-1961, 63 y.o.   MRN: [id number]  HPI  63 year old female who  has a past medical history of Anxiety, Asthma, B12 deficiency, Bipolar 1 disorder (HCC), CHF (congestive heart failure) (HCC), Epilepsy (HCC), Hypertension, and Sarcoidosis.    She presents to the office today for one month of neck pain with numbness and tingling down her right arm intermittently. She has a history of DD and foraminal stenosis and was seen by Dr. Rexanne Catalina numerous years ago for epidural injections. She has a hard time sleeping do to the discomfort and finds it difficult to get comfortable. No loss of grip strength.   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 Mild stenosis at C5-6. Moderate spinal and foraminal stenosis bilaterally at C6-7.  She was referred to Neurosurgery at this time but she does not remember going and I do not see any notes in the chart.   Review of Systems See HPI   Past Medical History:  Diagnosis Date   Anxiety    Asthma    B12 deficiency    Bipolar 1 disorder (HCC)    CHF (congestive heart failure) (HCC)    Epilepsy (HCC)    Hypertension    Sarcoidosis     Social History   Socioeconomic History   Marital status: Married    Spouse name: Not on file   Number of children: Not on file   Years of education: Not on file   Highest education level: Not on file  Occupational History   Not on file  Tobacco Use   Smoking status: Former    Types: Cigarettes   Smokeless tobacco: Never  Vaping Use   Vaping status: Never Used  Substance and Sexual Activity   Alcohol use: No   Drug use: No   Sexual activity: Not Currently    Birth control/protection: None  Other Topics Concern   Not on file  Social History Narrative   She has her own business    Married    Three children    5 grandchildren       She likes to spend time with  her grandchildren    Social Drivers of Corporate investment banker Strain: Low Risk  (07/31/2023)   Overall Financial Resource Strain (CARDIA)    Difficulty of Paying Living Expenses: Not hard at all  Food Insecurity: No Food Insecurity (07/31/2023)   Hunger Vital Sign    Worried About Running Out of Food in the Last Year: Never true    Ran Out of Food in the Last Year: Never true  Transportation Needs: No Transportation Needs (07/31/2023)   PRAPARE - Administrator, Civil Service (Medical): No    Lack of Transportation (Non-Medical): No  Physical Activity: Inactive (07/31/2023)   Exercise Vital Sign    Days of Exercise per Week: 0 days    Minutes of Exercise per Session: 0 min  Stress: No Stress Concern Present (07/31/2023)   [employer] of Occupational Health - Occupational Stress Questionnaire    Feeling of Stress : Not at all  Social Connections: Moderately Isolated (07/31/2023)   Social Connection and Isolation Panel [NHANES]    Frequency of Communication with Friends and Family: More than three times a week    Frequency of Social Gatherings with Friends and Family: More  than three times a week    Attends Religious Services: Never    Active Member of Clubs or Organizations: Yes    Attends Banker Meetings: More than 4 times per year    Marital Status: Divorced  Intimate Partner Violence: Not At Risk (07/31/2023)   Humiliation, Afraid, Rape, and Kick questionnaire    Fear of Current or Ex-Partner: No    Emotionally Abused: No    Physically Abused: No    Sexually Abused: No    Past Surgical History:  Procedure Laterality Date   COLONOSCOPY  2001   [facility]   COLONOSCOPY WITH PROPOFOL   06/10/2023   [facility]au at [facility]   TONSILLECTOMY     TUBAL LIGATION      Family History  Problem Relation Age of Onset   Pancreatic cancer Mother    Hypertension Mother    Diabetes Mother    Heart disease Father    Gout Father    Cirrhosis Father     Heart disease Sister        CHF   Colon cancer Neg Hx    Esophageal cancer Neg Hx    Rectal cancer Neg Hx    Stomach cancer Neg Hx     No Known Allergies  Current Outpatient Medications on File Prior to Visit  Medication Sig Dispense Refill   amLODipine  (NORVASC ) 10 MG tablet Take 1 tablet (10 mg total) by mouth daily. 90 tablet 3   carvedilol  (COREG ) 25 MG tablet TAKE 1 TABLET BY MOUTH TWICE A DAY WITH FOOD 180 tablet 1   furosemide  (LASIX ) 20 MG tablet Take 1 tablet (20 mg total) by mouth daily. 90 tablet 3   hydrochlorothiazide  (HYDRODIURIL ) 25 MG tablet Take 1 tablet (25 mg total) by mouth daily. 90 tablet 3   levocetirizine (XYZAL ) 5 MG tablet Take 1 tablet (5 mg total) by mouth every evening. 90 tablet 1   mometasone  (NASONEX ) 50 MCG/ACT nasal spray Place 2 sprays into the nose daily. 1 each 12   ondansetron  (ZOFRAN -ODT) 4 MG disintegrating tablet Take 1 tablet (4 mg total) by mouth every 8 (eight) hours as needed for nausea or vomiting. 20 tablet 0   rosuvastatin  (CRESTOR ) 40 MG tablet Take 1 tablet (40 mg total) by mouth daily. 90 tablet 3   losartan  (COZAAR ) 100 MG tablet Take 1 tablet (100 mg total) by mouth daily. 90 tablet 3   No current facility-administered medications on file prior to visit.    BP 138/88   Pulse (!) 59   Temp 98.3 F (36.8 C) (Oral)   Ht 5\' 3"  (1.6 m)   Wt 189 lb (85.7 kg)   LMP 12/10/2014   SpO2 97%   BMI 33.48 kg/m       Objective:   Physical Exam Vitals and nursing note reviewed.  Constitutional:      Appearance: Normal appearance.  Cardiovascular:     Rate and Rhythm: Regular rhythm.     Pulses: Normal pulses.     Heart sounds: Normal heart sounds.  Pulmonary:     Effort: Pulmonary effort is normal.     Breath sounds: Normal breath sounds.  Musculoskeletal:     Cervical back: No swelling, edema, deformity, rigidity or torticollis. Pain with movement (Positive modified Spurlings test) present. Normal range of motion.  Skin:     General: Skin is warm and dry.  Neurological:     Mental Status: She is alert.  Psychiatric:  Mood and Affect: Mood normal.        Behavior: Behavior normal.        Thought Content: Thought content normal.        Judgment: Judgment normal.        Assessment & Plan:   1. Cervical radiculopathy (Primary) - Will order xray today and treat with medrol  dose pack  - Likely need to order MRI.  - Referral to neurosurgery  - DG Cervical Spine Complete; Future - Ambulatory referral to Neurosurgery - methylPREDNISolone  (MEDROL  DOSEPAK) 4 MG TBPK tablet; Use as directed  Dispense: 21 tablet; Refill: 0  Alto Atta, NP

## 2024-05-26 ENCOUNTER — Ambulatory Visit: Payer: Self-pay | Admitting: Adult Health

## 2024-05-26 ENCOUNTER — Telehealth: Payer: Self-pay

## 2024-05-26 ENCOUNTER — Other Ambulatory Visit: Payer: Self-pay | Admitting: Adult Health

## 2024-05-26 DIAGNOSIS — M542 Cervicalgia: Secondary | ICD-10-CM

## 2024-05-26 MED ORDER — GABAPENTIN 300 MG PO CAPS
300.0000 mg | ORAL_CAPSULE | Freq: Three times a day (TID) | ORAL | 3 refills | Status: DC
Start: 2024-05-26 — End: 2024-07-06

## 2024-05-26 NOTE — Telephone Encounter (Signed)
 Patient notified of update  and verbalized understanding.

## 2024-05-26 NOTE — Telephone Encounter (Signed)
 Copied from CRM [id number]. Topic: Clinical - Lab/Test Results >> May 26, 2024  1:57 PM Baldo Levan wrote: Reason for CRM: Patient is calling Nils Baseman back regarding results, please contact the patient back.

## 2024-06-04 NOTE — Progress Notes (Deleted)
 Referring Physician:  Merna Huxley, NP [address] [place],  [place] [address]  Primary Physician:  Merna Huxley, NP  History of Present Illness: 06/04/2024 [name] is here today with a chief complaint of ***  Neck pain Any arm pain?  Duration: *** Location: *** Quality: *** Severity: ***  Precipitating: aggravated by *** Modifying factors: made better by *** Weakness: none Timing: *** Bowel/Bladder Dysfunction: none  Conservative measures:  Physical therapy: has not participated in PT  Multimodal medical therapy including regular antiinflammatories: Medrol  dosepak, Gabapentin Injections: no epidural steroid injections  Past Surgery: none  Mikayla Sweeney has ***no symptoms of cervical myelopathy.  The symptoms are causing a significant impact on the patient's life.   Review of Systems:  A 10 point review of systems is negative, except for the pertinent positives and negatives detailed in the HPI.  Past Medical History: Past Medical History:  Diagnosis Date   Anxiety    Asthma    B12 deficiency    Bipolar 1 disorder (HCC)    CHF (congestive heart failure) (HCC)    Epilepsy (HCC)    Hypertension    Sarcoidosis     Past Surgical History: Past Surgical History:  Procedure Laterality Date   COLONOSCOPY  2001   [facility]   COLONOSCOPY WITH PROPOFOL   06/10/2023   [facility] Cirigliano at [facility]   TONSILLECTOMY     TUBAL LIGATION      Allergies: Allergies as of 06/10/2024   (No Known Allergies)    Medications: Outpatient Encounter Medications as of 06/10/2024  Medication Sig   amLODipine  (NORVASC ) 10 MG tablet Take 1 tablet (10 mg total) by mouth daily.   carvedilol  (COREG ) 25 MG tablet TAKE 1 TABLET BY MOUTH TWICE A DAY WITH FOOD   furosemide  (LASIX ) 20 MG tablet Take 1 tablet (20 mg total) by mouth daily.   gabapentin (NEURONTIN) 300 MG capsule Take 1 capsule (300 mg total) by mouth 3 (three) times daily.   hydrochlorothiazide  (HYDRODIURIL )  25 MG tablet Take 1 tablet (25 mg total) by mouth daily.   levocetirizine (XYZAL ) 5 MG tablet Take 1 tablet (5 mg total) by mouth every evening.   losartan  (COZAAR ) 100 MG tablet Take 1 tablet (100 mg total) by mouth daily.   methylPREDNISolone  (MEDROL  DOSEPAK) 4 MG TBPK tablet Use as directed   mometasone  (NASONEX ) 50 MCG/ACT nasal spray Place 2 sprays into the nose daily.   ondansetron  (ZOFRAN -ODT) 4 MG disintegrating tablet Take 1 tablet (4 mg total) by mouth every 8 (eight) hours as needed for nausea or vomiting.   rosuvastatin  (CRESTOR ) 40 MG tablet Take 1 tablet (40 mg total) by mouth daily.   No facility-administered encounter medications on file as of 06/10/2024.    Social History: Social History   Tobacco Use   Smoking status: Former    Types: Cigarettes   Smokeless tobacco: Never  Vaping Use   Vaping status: Never Used  Substance Use Topics   Alcohol use: No   Drug use: No    Family Medical History: Family History  Problem Relation Age of Onset   Pancreatic cancer Mother    Hypertension Mother    Diabetes Mother    Heart disease Father    Gout Father    Cirrhosis Father    Heart disease Sister        CHF   Colon cancer Neg Hx    Esophageal cancer Neg Hx    Rectal cancer Neg Hx  Stomach cancer Neg Hx     Physical Examination: @VITALWITHPAIN @  General: Patient is well developed, well nourished, calm, collected, and in no apparent distress. Attention to examination is appropriate.  Psychiatric: Patient is non-anxious.  Head:  Pupils equal, round, and reactive to light.  ENT:  Oral mucosa appears well hydrated.  Neck:   Supple.  ***Full range of motion.  Respiratory: Patient is breathing without any difficulty.  Extremities: No edema.  Vascular: Palpable dorsal pedal pulses.  Skin:   On exposed skin, there are no abnormal skin lesions.  NEUROLOGICAL:     Awake, alert, oriented to person, place, and time.  Speech is clear and fluent. Fund of  knowledge is appropriate.   Cranial Nerves: Pupils equal round and reactive to light.  Facial tone is symmetric.  Facial sensation is symmetric.  ROM of spine: ***full.  Palpation of spine: ***non tender.    Strength: Side Biceps Triceps Deltoid Interossei Grip Wrist Ext. Wrist Flex.  R 5 5 5 5 5 5 5   L 5 5 5 5 5 5 5    Side Iliopsoas Quads Hamstring PF DF EHL  R 5 5 5 5 5 5   L 5 5 5 5 5 5    Reflexes are ***2+ and symmetric at the biceps, triceps, brachioradialis, patella and achilles.   Hoffman's is absent.  Clonus is not present.  Toes are down-going.  Bilateral upper and lower extremity sensation is intact to light touch.    Gait is normal.   No difficulty with tandem gait.   No evidence of dysmetria noted.  Medical Decision Making  Imaging: ***  I have personally reviewed the images and agree with the above interpretation.  Assessment and Plan: [name] is a pleasant 63 y.o. female with ***    Thank you for involving me in the care of this patient.   I spent a total of *** minutes in both face-to-face and non-face-to-face activities for this visit on the date of this encounter.   Lyle Decamp, PA-C Dept. of Neurosurgery

## 2024-06-10 ENCOUNTER — Ambulatory Visit: Admitting: Physician Assistant

## 2024-06-16 ENCOUNTER — Ambulatory Visit
Admission: RE | Admit: 2024-06-16 | Discharge: 2024-06-16 | Disposition: A | Discharge status: Home / Self Care | Source: Ambulatory Visit | Attending: Adult Health | Admitting: Adult Health

## 2024-06-16 DIAGNOSIS — M47812 Spondylosis without myelopathy or radiculopathy, cervical region: Secondary | ICD-10-CM | POA: Diagnosis not present

## 2024-06-16 DIAGNOSIS — M4802 Spinal stenosis, cervical region: Secondary | ICD-10-CM | POA: Diagnosis not present

## 2024-06-16 DIAGNOSIS — M542 Cervicalgia: Secondary | ICD-10-CM

## 2024-06-17 ENCOUNTER — Other Ambulatory Visit: Payer: Self-pay | Admitting: Adult Health

## 2024-06-17 ENCOUNTER — Ambulatory Visit: Payer: Self-pay | Admitting: Adult Health

## 2024-06-17 MED ORDER — IBUPROFEN 600 MG PO TABS
600.0000 mg | ORAL_TABLET | Freq: Three times a day (TID) | ORAL | 1 refills | Status: DC | PRN
Start: 2024-06-17 — End: 2024-08-19

## 2024-06-17 MED ORDER — TRAMADOL HCL 50 MG PO TABS: 50.0000 mg | ORAL_TABLET | Freq: Three times a day (TID) | ORAL | 0 refills | Status: AC | PRN

## 2024-06-17 NOTE — Telephone Encounter (Signed)
**Note De-identified  [place] Obfuscation** Please advise 

## 2024-07-06 ENCOUNTER — Ambulatory Visit (INDEPENDENT_AMBULATORY_CARE_PROVIDER_SITE_OTHER): Admitting: Physician Assistant

## 2024-07-06 ENCOUNTER — Encounter: Payer: Self-pay | Admitting: Physician Assistant

## 2024-07-06 VITALS — BP 128/74 | Ht 63.0 in | Wt 193.0 lb

## 2024-07-06 DIAGNOSIS — M4802 Spinal stenosis, cervical region: Secondary | ICD-10-CM

## 2024-07-06 DIAGNOSIS — M5010 Cervical disc disorder with radiculopathy, unspecified cervical region: Secondary | ICD-10-CM

## 2024-07-06 DIAGNOSIS — M47816 Spondylosis without myelopathy or radiculopathy, lumbar region: Secondary | ICD-10-CM

## 2024-07-06 MED ORDER — MELOXICAM 15 MG PO TABS: 15.0000 mg | ORAL_TABLET | Freq: Every day | ORAL | 2 refills | Status: AC

## 2024-07-06 NOTE — Progress Notes (Unsigned)
 Referring Physician:  Merna Huxley, NP [address] [place],  [place] [address]  Primary Physician:  Merna Huxley, NP  History of Present Illness: 07/06/2024 [name] is here today with a chief complaint of acute on chronic neck pain.  She states that for the past 3 months she has had pain that is increased and radiating into her right arm with associated numbness and tingling.  She adds that her hand will get numb and numbness and tingling extends from her shoulder down.  She adds that her numbness is worse upon waking and she often has to shake her hand for sensation to return to normal.  She previously done cervical ESI which helped, but in May her pain returned.  Her neck pain increases when laying down and it is hard to get comfortable with sleep.  She denies any changes to her fine motor skills and is not dropping anything or had any changes to her gait.  She did have a short dose of steroids which has helped her pain.     Weakness: none Bowel/Bladder Dysfunction: none  Conservative measures:  Physical therapy: has not participated in PT  Multimodal medical therapy including regular antiinflammatories: Medrol  dosepak, Gabapentin  (no relief) Injections: per patient had ESI injections about 15 years ago at a surgery center in [place] for her back and neck  Past Surgery: none  [name] has no symptoms of cervical myelopathy.  The symptoms are causing a significant impact on the patient's life.   Review of Systems:  A 10 point review of systems is negative, except for the pertinent positives and negatives detailed in the HPI.  Past Medical History: Past Medical History:  Diagnosis Date   Anxiety    Asthma    B12 deficiency    Bipolar 1 disorder (HCC)    CHF (congestive heart failure) (HCC)    Epilepsy (HCC)    Hypertension    Sarcoidosis     Past Surgical History: Past Surgical History:  Procedure Laterality Date   COLONOSCOPY  2001    [facility]   COLONOSCOPY WITH PROPOFOL   06/10/2023   [facility] Cirigliano at [facility]   TONSILLECTOMY     TUBAL LIGATION      Allergies: Allergies as of 07/06/2024   (No Known Allergies)    Medications: Outpatient Encounter Medications as of 07/06/2024  Medication Sig   amLODipine  (NORVASC ) 10 MG tablet Take 1 tablet (10 mg total) by mouth daily.   carvedilol  (COREG ) 25 MG tablet TAKE 1 TABLET BY MOUTH TWICE A DAY WITH FOOD   furosemide  (LASIX ) 20 MG tablet Take 1 tablet (20 mg total) by mouth daily.   hydrochlorothiazide  (HYDRODIURIL ) 25 MG tablet Take 1 tablet (25 mg total) by mouth daily.   ibuprofen  (ADVIL ) 600 MG tablet Take 1 tablet (600 mg total) by mouth every 8 (eight) hours as needed.   levocetirizine (XYZAL ) 5 MG tablet Take 1 tablet (5 mg total) by mouth every evening.   losartan  (COZAAR ) 100 MG tablet Take 1 tablet (100 mg total) by mouth daily.   mometasone  (NASONEX ) 50 MCG/ACT nasal spray Place 2 sprays into the nose daily.   ondansetron  (ZOFRAN -ODT) 4 MG disintegrating tablet Take 1 tablet (4 mg total) by mouth every 8 (eight) hours as needed for nausea or vomiting.   rosuvastatin  (CRESTOR ) 40 MG tablet Take 1 tablet (40 mg total) by mouth daily.   [DISCONTINUED] gabapentin  (NEURONTIN ) 300 MG capsule Take 1 capsule (300 mg total) by mouth 3 (  three) times daily.   [DISCONTINUED] methylPREDNISolone  (MEDROL  DOSEPAK) 4 MG TBPK tablet Use as directed   No facility-administered encounter medications on file as of 07/06/2024.    Social History: Social History   Tobacco Use   Smoking status: Former    Types: Cigarettes   Smokeless tobacco: Never  Vaping Use   Vaping status: Never Used  Substance Use Topics   Alcohol use: No   Drug use: No    Family Medical History: Family History  Problem Relation Age of Onset   Pancreatic cancer Mother    Hypertension Mother    Diabetes Mother    Heart disease Father    Gout Father    Cirrhosis Father    Heart disease Sister        CHF    Colon cancer Neg Hx    Esophageal cancer Neg Hx    Rectal cancer Neg Hx    Stomach cancer Neg Hx     Physical Examination: @VITALWITHPAIN @  General: Patient is well developed, well nourished, calm, collected, and in no apparent distress. Attention to examination is appropriate.  Psychiatric: Patient is non-anxious.  Head:  Pupils equal, round, and reactive to light.  ENT:  Oral mucosa appears well hydrated.  Neck:   Supple.   Respiratory: Patient is breathing without any difficulty.  Extremities: No edema.  Vascular: Palpable dorsal pedal pulses.  Skin:   On exposed skin, there are no abnormal skin lesions.  NEUROLOGICAL:     Awake, alert, oriented to person, place, and time.  Speech is clear and fluent. Fund of knowledge is appropriate.   Cranial Nerves: Pupils equal round and reactive to light.  Facial tone is symmetric.   ROM of spine: Some mild tenderness palpation of her cervical paraspinals.  Strength: Side Biceps Triceps Deltoid Interossei Grip Wrist Ext. Wrist Flex.  R 5 5 5 5 5 5 5   L 5 5 5 5 5 5 5     + tinel of right median nerve No spurlings  + phalens  Reflexes are 2+ and symmetric at the biceps, triceps, brachioradialis. Hoffman's is absent.  Bilateral upper and lower extremity sensation is intact to light touch.    Gait is normal.   No difficulty with tandem gait.   No evidence of dysmetria noted.  Medical Decision Making  Imaging: IMPRESSION: 1. Multilevel cervical spondylosis, similar to previous MRI from 2020. No acute findings demonstrated. 2. There is chronic moderate foraminal narrowing bilaterally at C6-7 with possible chronic bilateral C7 nerve root encroachment. 3. Moderate right and mild left foraminal narrowing at C5-6. 4. Mild central stenosis at C6-7. No cord deformity or abnormal cord signal.  I have personally reviewed the images and agree with the above interpretation.  Assessment and Plan: [name] is a pleasant 63  y.o. female with acute on chronic neck pain.  On examination she has full strength however has some mild tenderness palpation of her cervical paraspinals.  Positive Tinel of right median nerve as well as a positive Phalen's.  No Spurling's.  MRI was reviewed which does not show any cervical cord deformity or normal cord signal.  There is multilevel cervical spondylosis as well as some chronic foraminal narrowing noted.  Plan moving forward includes the following:  -Meloxicam  added to pain regimen for pain.  Dosage and risks and side effects were discussed at length. - Patient would like to see her previous pain clinic for possible injections.  I placed this referral for her. - Would like  patient to start physical therapy.  Referral has been placed to her location of choice as well. - Patient had positive provocative symptoms for carpal tunnel syndrome.  Would like her to undergo an EMG for evaluation of right carpal tunnel syndrome versus cervical radiculopathy. - Will review results once complete.  Plan to see back in approximate weeks.    Thank you for involving me in the care of this patient.   I spent a total of 45 minutes in both face-to-face and non-face-to-face activities for this visit on the date of this encounter including preparing to see the patient, obtaining and reviewing separately obtained history, performing medically appropriate examination, counseling the patient, ordering additional medications and tests, documenting clinical information, independently interpreting results.  Lyle Decamp, PA-C Dept. of Neurosurgery

## 2024-08-18 ENCOUNTER — Other Ambulatory Visit: Payer: Self-pay | Admitting: Adult Health

## 2024-10-19 ENCOUNTER — Other Ambulatory Visit: Payer: Self-pay | Admitting: Adult Health

## 2024-10-19 DIAGNOSIS — I5032 Chronic diastolic (congestive) heart failure: Secondary | ICD-10-CM

## 2024-10-19 DIAGNOSIS — I10 Essential (primary) hypertension: Secondary | ICD-10-CM

## 2024-10-19 DIAGNOSIS — E782 Mixed hyperlipidemia: Secondary | ICD-10-CM

## 2024-12-01 ENCOUNTER — Telehealth: Payer: Self-pay

## 2024-12-01 NOTE — Telephone Encounter (Signed)
 Received PPW from [facility] advising if pt has dm or any cardiovascular disorders. PPW filled out and faxed.

## 2024-12-15 ENCOUNTER — Ambulatory Visit: Admitting: Family Medicine
